# Patient Record
Sex: Female | Born: 1935
Health system: Southern US, Community
[De-identification: ages and names within clinical notes are randomized; demographics above are authoritative.]

## PROBLEM LIST (undated history)

## (undated) DIAGNOSIS — I10 Essential (primary) hypertension: Secondary | ICD-10-CM

## (undated) DIAGNOSIS — R32 Unspecified urinary incontinence: Secondary | ICD-10-CM

## (undated) DIAGNOSIS — Z87442 Personal history of urinary calculi: Secondary | ICD-10-CM

## (undated) DIAGNOSIS — M51369 Other intervertebral disc degeneration, lumbar region without mention of lumbar back pain or lower extremity pain: Secondary | ICD-10-CM

## (undated) DIAGNOSIS — N390 Urinary tract infection, site not specified: Secondary | ICD-10-CM

## (undated) DIAGNOSIS — E119 Type 2 diabetes mellitus without complications: Secondary | ICD-10-CM

## (undated) DIAGNOSIS — K219 Gastro-esophageal reflux disease without esophagitis: Secondary | ICD-10-CM

## (undated) DIAGNOSIS — E785 Hyperlipidemia, unspecified: Secondary | ICD-10-CM

## (undated) DIAGNOSIS — M5136 Other intervertebral disc degeneration, lumbar region: Secondary | ICD-10-CM

## (undated) DIAGNOSIS — M810 Age-related osteoporosis without current pathological fracture: Secondary | ICD-10-CM

## (undated) DIAGNOSIS — T7840XA Allergy, unspecified, initial encounter: Secondary | ICD-10-CM

## (undated) DIAGNOSIS — N3090 Cystitis, unspecified without hematuria: Secondary | ICD-10-CM

## (undated) HISTORY — DX: Age-related osteoporosis without current pathological fracture: M81.0

## (undated) HISTORY — DX: Unspecified urinary incontinence: R32

## (undated) HISTORY — DX: Allergy, unspecified, initial encounter: T78.40XA

## (undated) HISTORY — DX: Personal history of urinary calculi: Z87.442

## (undated) HISTORY — PX: HEMORRHOID SURGERY: SHX153

## (undated) HISTORY — PX: ABDOMINAL HYSTERECTOMY: SHX81

## (undated) HISTORY — DX: Hyperlipidemia, unspecified: E78.5

## (undated) HISTORY — DX: Essential (primary) hypertension: I10

## (undated) HISTORY — DX: Other intervertebral disc degeneration, lumbar region: M51.36

## (undated) HISTORY — DX: Gastro-esophageal reflux disease without esophagitis: K21.9

## (undated) HISTORY — DX: Other intervertebral disc degeneration, lumbar region without mention of lumbar back pain or lower extremity pain: M51.369

## (undated) HISTORY — PX: CATARACT EXTRACTION, BILATERAL: SHX1313

## (undated) HISTORY — DX: Type 2 diabetes mellitus without complications: E11.9

---

## 2004-11-07 ENCOUNTER — Ambulatory Visit: Payer: Self-pay

## 2006-09-04 ENCOUNTER — Ambulatory Visit: Payer: Self-pay | Admitting: Ophthalmology

## 2006-09-10 ENCOUNTER — Ambulatory Visit: Payer: Self-pay | Admitting: Ophthalmology

## 2006-12-11 ENCOUNTER — Ambulatory Visit: Payer: Self-pay | Admitting: Gastroenterology

## 2007-05-12 ENCOUNTER — Emergency Department: Payer: Self-pay | Admitting: Emergency Medicine

## 2007-05-29 ENCOUNTER — Ambulatory Visit: Payer: Self-pay | Admitting: Urology

## 2007-06-03 ENCOUNTER — Ambulatory Visit: Payer: Self-pay | Admitting: Urology

## 2007-10-16 DIAGNOSIS — E119 Type 2 diabetes mellitus without complications: Secondary | ICD-10-CM | POA: Insufficient documentation

## 2007-10-16 DIAGNOSIS — E7800 Pure hypercholesterolemia, unspecified: Secondary | ICD-10-CM | POA: Insufficient documentation

## 2007-10-18 DIAGNOSIS — N200 Calculus of kidney: Secondary | ICD-10-CM | POA: Insufficient documentation

## 2007-10-18 DIAGNOSIS — I10 Essential (primary) hypertension: Secondary | ICD-10-CM | POA: Insufficient documentation

## 2007-10-18 DIAGNOSIS — M810 Age-related osteoporosis without current pathological fracture: Secondary | ICD-10-CM | POA: Insufficient documentation

## 2007-10-22 ENCOUNTER — Ambulatory Visit: Payer: Self-pay | Admitting: Family Medicine

## 2007-10-28 DIAGNOSIS — C44309 Unspecified malignant neoplasm of skin of other parts of face: Secondary | ICD-10-CM | POA: Insufficient documentation

## 2007-10-28 DIAGNOSIS — K5730 Diverticulosis of large intestine without perforation or abscess without bleeding: Secondary | ICD-10-CM | POA: Insufficient documentation

## 2007-10-28 DIAGNOSIS — Z86010 Personal history of colonic polyps: Secondary | ICD-10-CM | POA: Insufficient documentation

## 2008-01-12 ENCOUNTER — Ambulatory Visit: Payer: Self-pay | Admitting: Urology

## 2008-01-14 ENCOUNTER — Ambulatory Visit: Payer: Self-pay | Admitting: Urology

## 2008-03-22 DIAGNOSIS — I471 Supraventricular tachycardia: Secondary | ICD-10-CM | POA: Insufficient documentation

## 2008-03-22 DIAGNOSIS — I4891 Unspecified atrial fibrillation: Secondary | ICD-10-CM | POA: Insufficient documentation

## 2008-07-07 ENCOUNTER — Ambulatory Visit: Payer: Self-pay | Admitting: Family Medicine

## 2008-11-04 ENCOUNTER — Ambulatory Visit: Payer: Self-pay | Admitting: Family Medicine

## 2009-03-03 ENCOUNTER — Ambulatory Visit: Payer: Self-pay | Admitting: Family Medicine

## 2009-04-11 IMAGING — CT CT STONE STUDY
1 of 2 series · 15 of 32 positions shown, 19 images · non-contrast
Comparison: none

REASON FOR EXAM: pain, rm 5
COMMENTS:

[Series 2: stone · axial · 0.73mm/px · z∈[-10,+365]mm · 15 of 141 slices shown, 19 images]
[im 11/141  soft-tissue]
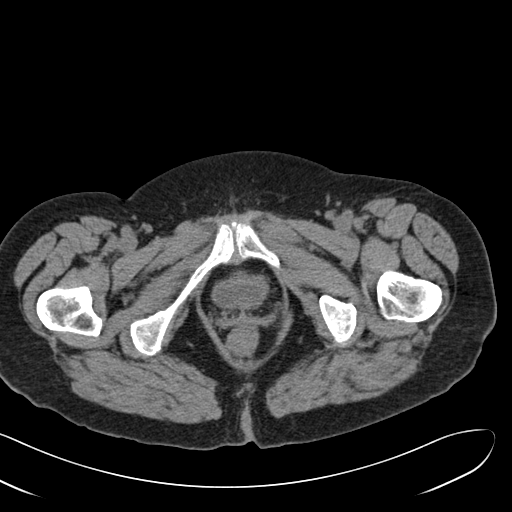
[im 11/141  bone]
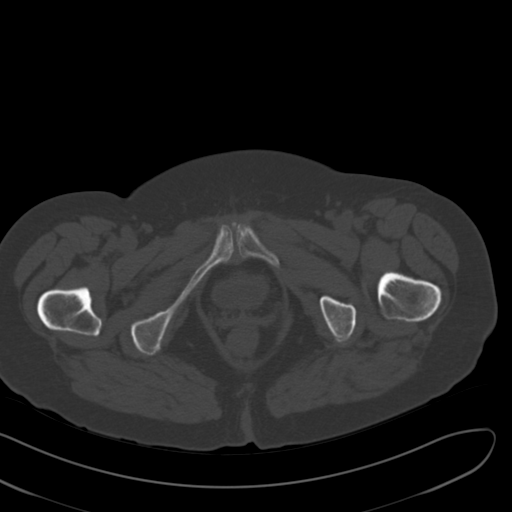
[im 21/141  soft-tissue]
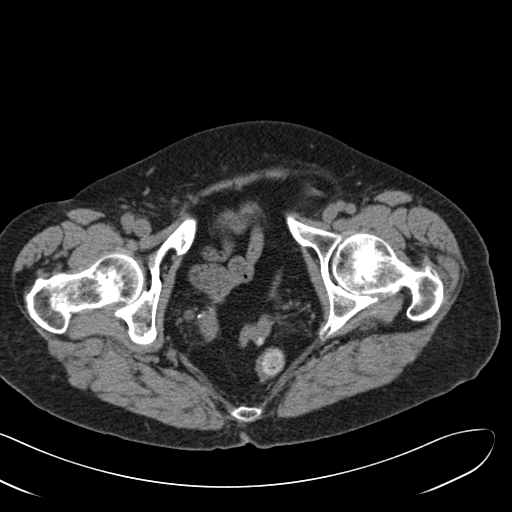
[im 31/141  soft-tissue]
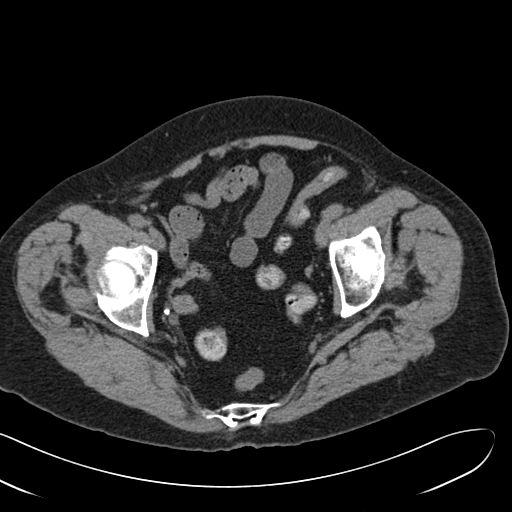
[im 41/141  soft-tissue]
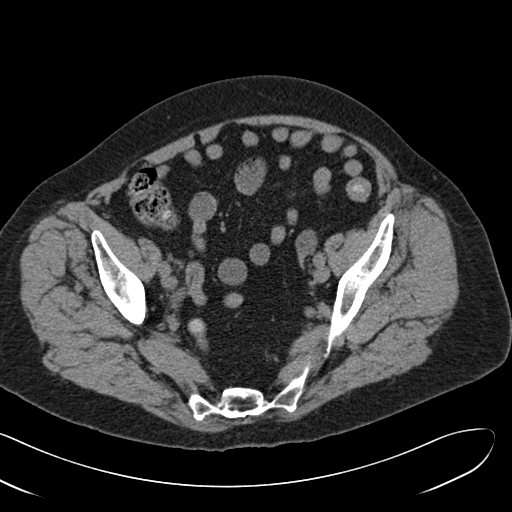
[im 51/141  soft-tissue]
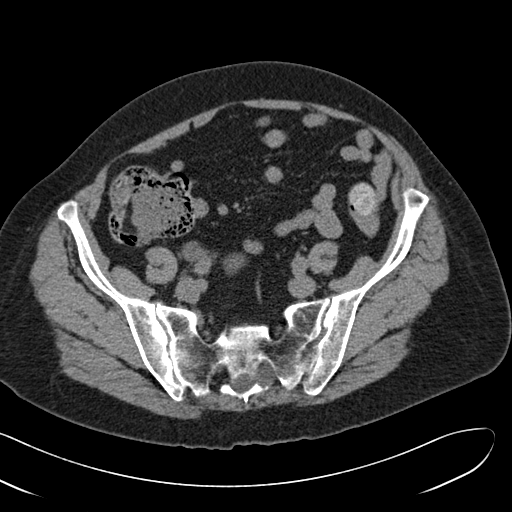
[im 61/141  soft-tissue]
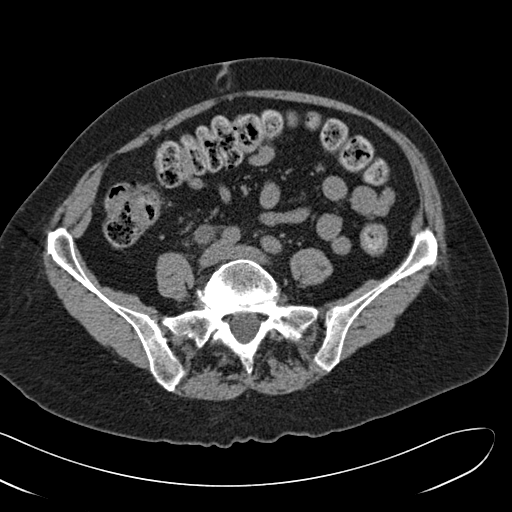
[im 71/141  soft-tissue]
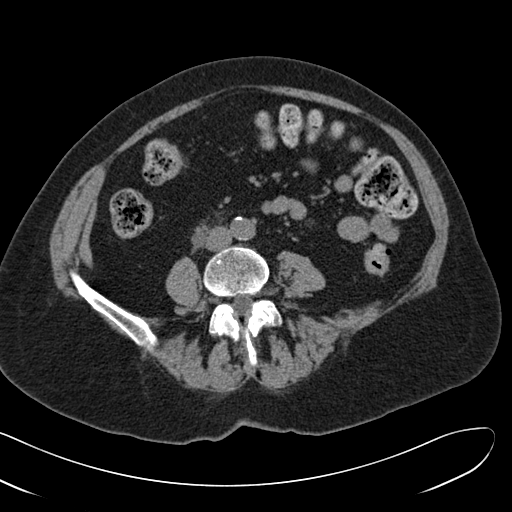
[im 81/141  soft-tissue]
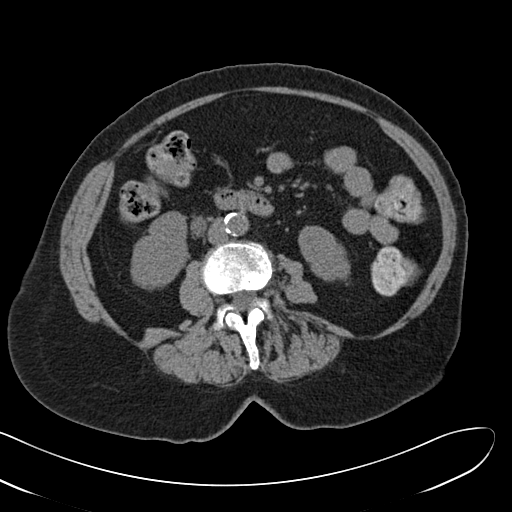
[im 91/141  soft-tissue]
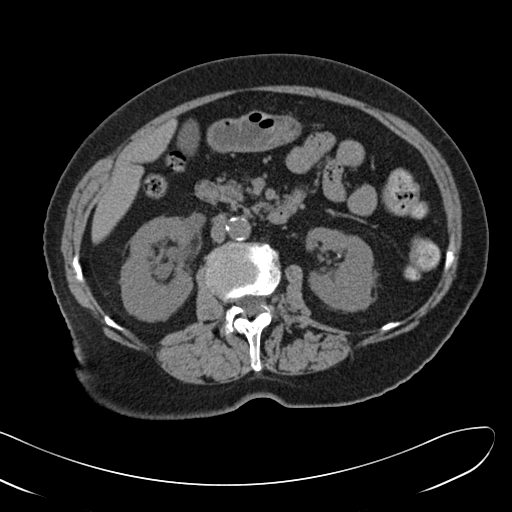
[im 91/141  bone]
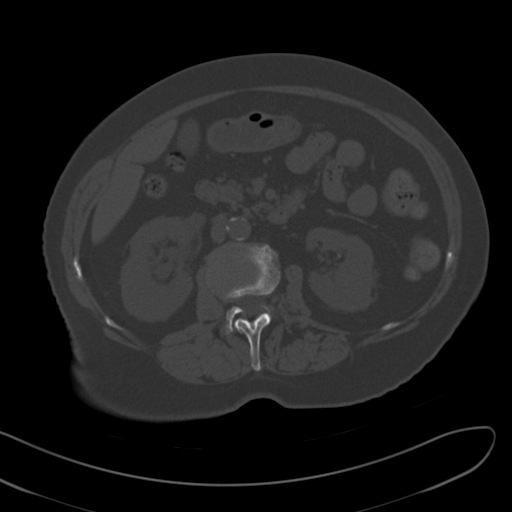
[im 101/141  soft-tissue]
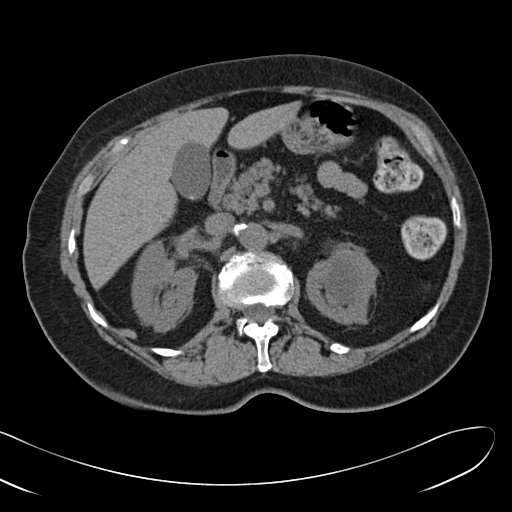
[im 111/141  soft-tissue]
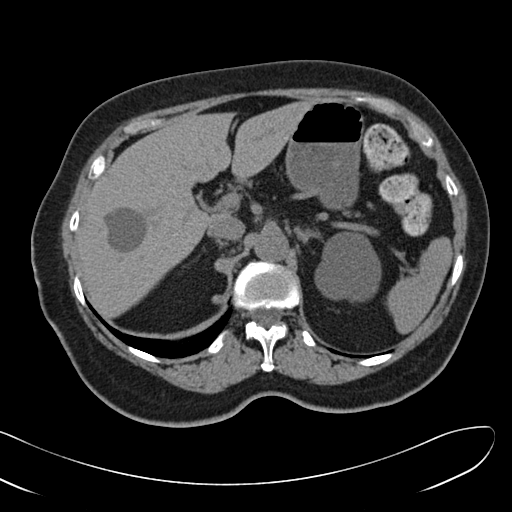
[im 121/141  soft-tissue]
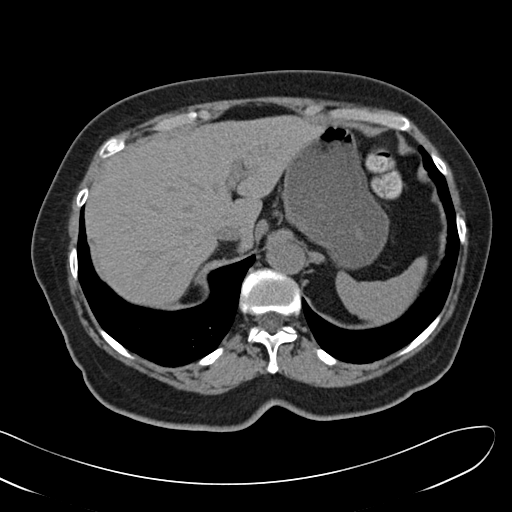
[im 121/141  lung]
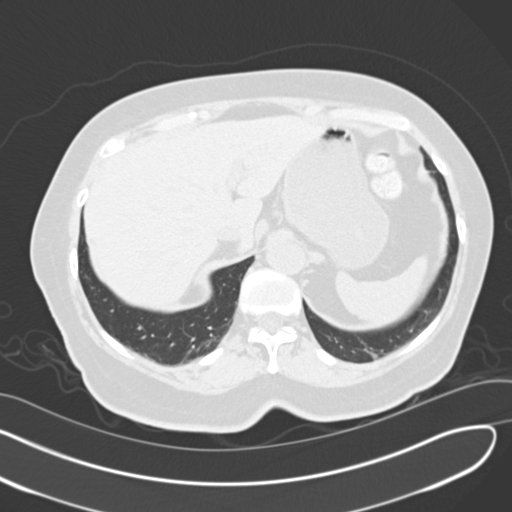
[im 126/141  lung]
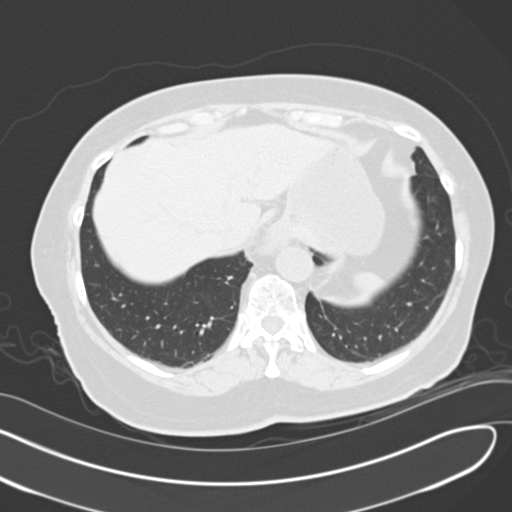
[im 131/141  soft-tissue]
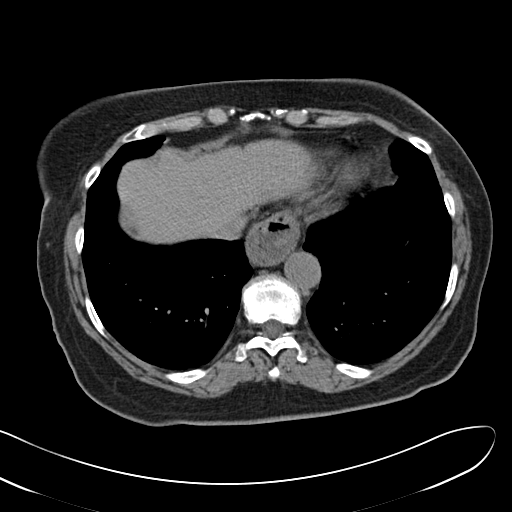
[im 131/141  lung]
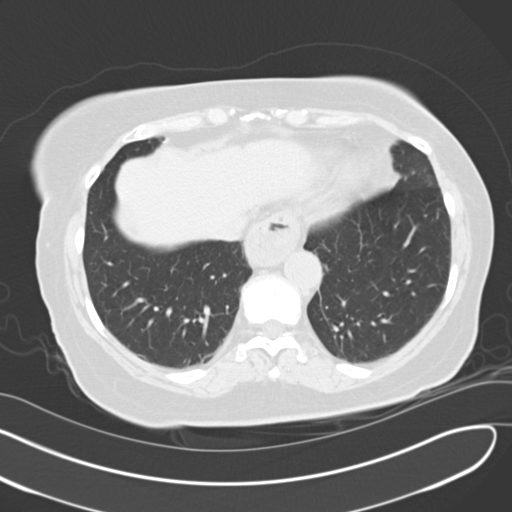
[im 136/141  lung]
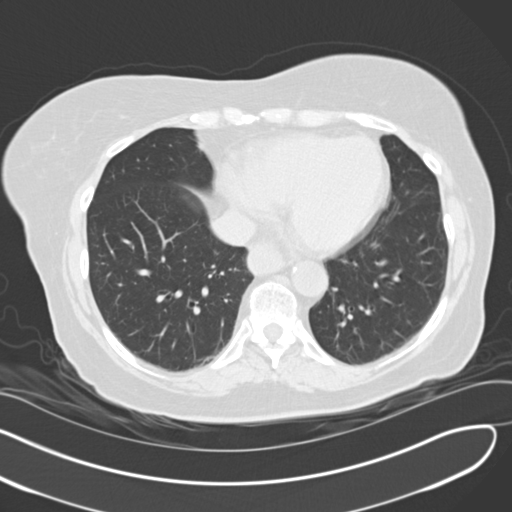

[15 of 32 positions shown; findings below may reference images not displayed]

PROCEDURE:     CT  - CT ABDOMEN /PELVIS WO (STONE)  - May 13, 2007  [DATE]

RESULT:     Emergent renal stone protocol CT of the abdomen and pelvis is
performed without contrast and reconstructed at 3 mm slice thickness.

There are no prior examinations for comparison.

The lung bases appear to be clear. There is no pleural or pericardial
effusion. A small hiatal hernia is present. There is low attenuation within
the liver suggestive of a cyst. There appear to be low attenuation areas in
both kidneys suggestive of cysts most prominently seen in the upper pole of
the LEFT kidney. There is RIGHT renal hydronephrosis and hydroureter
extending inferiorly to a RIGHT ureterovesical junction roughly 2 mm stone.
Additional non-obstructing stones are seen in the RIGHT kidney. There is no
abnormal bowel distention. Gallstones are present. The aorta shows
atherosclerotic calcification with no evidence of aneurysm. Scattered
colonic diverticulosis is evident without evidence of diverticulitis.
IMPRESSION: 1. RIGHT hydronephrosis that is mild to moderate secondary to a 2 mm RIGHT
UVJ stone. Additional non-obstructing RIGHT renal calculi are present.
2. Cholelithiasis.
3. Probable hepatic and renal cysts.

## 2009-04-18 ENCOUNTER — Ambulatory Visit: Payer: Self-pay | Admitting: Gastroenterology

## 2009-04-26 DIAGNOSIS — K648 Other hemorrhoids: Secondary | ICD-10-CM | POA: Insufficient documentation

## 2009-05-18 ENCOUNTER — Ambulatory Visit: Payer: Self-pay | Admitting: Ophthalmology

## 2009-05-18 ENCOUNTER — Ambulatory Visit: Payer: Self-pay | Admitting: Internal Medicine

## 2009-05-31 ENCOUNTER — Ambulatory Visit: Payer: Self-pay | Admitting: Ophthalmology

## 2010-01-12 ENCOUNTER — Ambulatory Visit: Payer: Self-pay | Admitting: Family Medicine

## 2010-05-17 ENCOUNTER — Ambulatory Visit: Payer: Self-pay | Admitting: Family Medicine

## 2010-10-30 ENCOUNTER — Ambulatory Visit: Payer: Self-pay

## 2010-11-14 ENCOUNTER — Ambulatory Visit: Payer: Self-pay

## 2011-03-16 ENCOUNTER — Ambulatory Visit: Payer: Self-pay | Admitting: Family Medicine

## 2011-04-24 ENCOUNTER — Ambulatory Visit: Payer: Self-pay | Admitting: Family Medicine

## 2011-09-10 DIAGNOSIS — I10 Essential (primary) hypertension: Secondary | ICD-10-CM | POA: Diagnosis not present

## 2011-09-10 DIAGNOSIS — R110 Nausea: Secondary | ICD-10-CM | POA: Diagnosis not present

## 2011-09-10 DIAGNOSIS — E7800 Pure hypercholesterolemia, unspecified: Secondary | ICD-10-CM | POA: Diagnosis not present

## 2011-09-10 DIAGNOSIS — E119 Type 2 diabetes mellitus without complications: Secondary | ICD-10-CM | POA: Diagnosis not present

## 2011-09-18 DIAGNOSIS — R197 Diarrhea, unspecified: Secondary | ICD-10-CM | POA: Diagnosis not present

## 2011-09-20 DIAGNOSIS — K5730 Diverticulosis of large intestine without perforation or abscess without bleeding: Secondary | ICD-10-CM | POA: Diagnosis not present

## 2011-09-20 DIAGNOSIS — K6289 Other specified diseases of anus and rectum: Secondary | ICD-10-CM | POA: Diagnosis not present

## 2011-10-08 DIAGNOSIS — C44319 Basal cell carcinoma of skin of other parts of face: Secondary | ICD-10-CM | POA: Diagnosis not present

## 2011-10-08 DIAGNOSIS — L905 Scar conditions and fibrosis of skin: Secondary | ICD-10-CM | POA: Diagnosis not present

## 2011-12-10 DIAGNOSIS — E119 Type 2 diabetes mellitus without complications: Secondary | ICD-10-CM | POA: Diagnosis not present

## 2011-12-10 DIAGNOSIS — M545 Low back pain: Secondary | ICD-10-CM | POA: Diagnosis not present

## 2011-12-10 DIAGNOSIS — I10 Essential (primary) hypertension: Secondary | ICD-10-CM | POA: Diagnosis not present

## 2011-12-10 DIAGNOSIS — E7800 Pure hypercholesterolemia, unspecified: Secondary | ICD-10-CM | POA: Diagnosis not present

## 2012-01-10 DIAGNOSIS — H1010 Acute atopic conjunctivitis, unspecified eye: Secondary | ICD-10-CM | POA: Diagnosis not present

## 2012-01-10 DIAGNOSIS — J309 Allergic rhinitis, unspecified: Secondary | ICD-10-CM | POA: Diagnosis not present

## 2012-01-10 DIAGNOSIS — R05 Cough: Secondary | ICD-10-CM | POA: Diagnosis not present

## 2012-03-27 DIAGNOSIS — Z0000 Encounter for general adult medical examination without abnormal findings: Secondary | ICD-10-CM | POA: Diagnosis not present

## 2012-03-27 DIAGNOSIS — I10 Essential (primary) hypertension: Secondary | ICD-10-CM | POA: Diagnosis not present

## 2012-03-27 DIAGNOSIS — E119 Type 2 diabetes mellitus without complications: Secondary | ICD-10-CM | POA: Diagnosis not present

## 2012-03-27 DIAGNOSIS — Z23 Encounter for immunization: Secondary | ICD-10-CM | POA: Diagnosis not present

## 2012-03-27 DIAGNOSIS — E7800 Pure hypercholesterolemia, unspecified: Secondary | ICD-10-CM | POA: Diagnosis not present

## 2012-05-26 DIAGNOSIS — Z23 Encounter for immunization: Secondary | ICD-10-CM | POA: Diagnosis not present

## 2012-10-06 DIAGNOSIS — E119 Type 2 diabetes mellitus without complications: Secondary | ICD-10-CM | POA: Diagnosis not present

## 2012-10-06 DIAGNOSIS — I10 Essential (primary) hypertension: Secondary | ICD-10-CM | POA: Diagnosis not present

## 2012-10-06 DIAGNOSIS — M549 Dorsalgia, unspecified: Secondary | ICD-10-CM | POA: Diagnosis not present

## 2012-10-06 DIAGNOSIS — E7800 Pure hypercholesterolemia, unspecified: Secondary | ICD-10-CM | POA: Diagnosis not present

## 2012-12-25 DIAGNOSIS — M549 Dorsalgia, unspecified: Secondary | ICD-10-CM | POA: Diagnosis not present

## 2012-12-25 DIAGNOSIS — W57XXXA Bitten or stung by nonvenomous insect and other nonvenomous arthropods, initial encounter: Secondary | ICD-10-CM | POA: Diagnosis not present

## 2012-12-25 DIAGNOSIS — E7800 Pure hypercholesterolemia, unspecified: Secondary | ICD-10-CM | POA: Diagnosis not present

## 2012-12-25 DIAGNOSIS — T148 Other injury of unspecified body region: Secondary | ICD-10-CM | POA: Diagnosis not present

## 2012-12-25 DIAGNOSIS — I10 Essential (primary) hypertension: Secondary | ICD-10-CM | POA: Diagnosis not present

## 2013-01-20 ENCOUNTER — Other Ambulatory Visit: Payer: Self-pay | Admitting: Family Medicine

## 2013-04-13 DIAGNOSIS — E7800 Pure hypercholesterolemia, unspecified: Secondary | ICD-10-CM | POA: Diagnosis not present

## 2013-04-13 DIAGNOSIS — I10 Essential (primary) hypertension: Secondary | ICD-10-CM | POA: Diagnosis not present

## 2013-04-13 DIAGNOSIS — E785 Hyperlipidemia, unspecified: Secondary | ICD-10-CM | POA: Diagnosis not present

## 2013-04-13 DIAGNOSIS — R5381 Other malaise: Secondary | ICD-10-CM | POA: Diagnosis not present

## 2013-04-13 DIAGNOSIS — E119 Type 2 diabetes mellitus without complications: Secondary | ICD-10-CM | POA: Diagnosis not present

## 2013-04-28 ENCOUNTER — Other Ambulatory Visit: Payer: Self-pay | Admitting: Family Medicine

## 2013-06-04 DIAGNOSIS — E785 Hyperlipidemia, unspecified: Secondary | ICD-10-CM | POA: Diagnosis not present

## 2013-06-04 DIAGNOSIS — R5381 Other malaise: Secondary | ICD-10-CM | POA: Diagnosis not present

## 2013-06-04 DIAGNOSIS — E119 Type 2 diabetes mellitus without complications: Secondary | ICD-10-CM | POA: Diagnosis not present

## 2013-06-04 DIAGNOSIS — Z23 Encounter for immunization: Secondary | ICD-10-CM | POA: Diagnosis not present

## 2013-06-04 DIAGNOSIS — I10 Essential (primary) hypertension: Secondary | ICD-10-CM | POA: Diagnosis not present

## 2013-09-14 DIAGNOSIS — R5383 Other fatigue: Secondary | ICD-10-CM | POA: Diagnosis not present

## 2013-09-14 DIAGNOSIS — Z23 Encounter for immunization: Secondary | ICD-10-CM | POA: Diagnosis not present

## 2013-09-14 DIAGNOSIS — E119 Type 2 diabetes mellitus without complications: Secondary | ICD-10-CM | POA: Diagnosis not present

## 2013-09-14 DIAGNOSIS — R5381 Other malaise: Secondary | ICD-10-CM | POA: Diagnosis not present

## 2013-09-14 DIAGNOSIS — F411 Generalized anxiety disorder: Secondary | ICD-10-CM | POA: Diagnosis not present

## 2013-09-14 DIAGNOSIS — I10 Essential (primary) hypertension: Secondary | ICD-10-CM | POA: Diagnosis not present

## 2013-11-23 DIAGNOSIS — R0902 Hypoxemia: Secondary | ICD-10-CM | POA: Diagnosis not present

## 2013-11-23 DIAGNOSIS — J111 Influenza due to unidentified influenza virus with other respiratory manifestations: Secondary | ICD-10-CM | POA: Diagnosis not present

## 2013-11-23 DIAGNOSIS — R509 Fever, unspecified: Secondary | ICD-10-CM | POA: Diagnosis not present

## 2014-01-13 DIAGNOSIS — E119 Type 2 diabetes mellitus without complications: Secondary | ICD-10-CM | POA: Diagnosis not present

## 2014-02-15 ENCOUNTER — Ambulatory Visit: Payer: Self-pay | Admitting: Family Medicine

## 2014-02-15 DIAGNOSIS — R5383 Other fatigue: Secondary | ICD-10-CM | POA: Diagnosis not present

## 2014-02-15 DIAGNOSIS — Z23 Encounter for immunization: Secondary | ICD-10-CM | POA: Diagnosis not present

## 2014-02-15 DIAGNOSIS — E7800 Pure hypercholesterolemia, unspecified: Secondary | ICD-10-CM | POA: Diagnosis not present

## 2014-02-15 DIAGNOSIS — M47817 Spondylosis without myelopathy or radiculopathy, lumbosacral region: Secondary | ICD-10-CM | POA: Diagnosis not present

## 2014-02-15 DIAGNOSIS — M810 Age-related osteoporosis without current pathological fracture: Secondary | ICD-10-CM | POA: Diagnosis not present

## 2014-02-15 DIAGNOSIS — I10 Essential (primary) hypertension: Secondary | ICD-10-CM | POA: Diagnosis not present

## 2014-02-15 DIAGNOSIS — M5137 Other intervertebral disc degeneration, lumbosacral region: Secondary | ICD-10-CM | POA: Diagnosis not present

## 2014-02-15 DIAGNOSIS — E119 Type 2 diabetes mellitus without complications: Secondary | ICD-10-CM | POA: Diagnosis not present

## 2014-02-15 DIAGNOSIS — R5381 Other malaise: Secondary | ICD-10-CM | POA: Diagnosis not present

## 2014-02-15 DIAGNOSIS — M549 Dorsalgia, unspecified: Secondary | ICD-10-CM | POA: Diagnosis not present

## 2014-08-09 DIAGNOSIS — M810 Age-related osteoporosis without current pathological fracture: Secondary | ICD-10-CM | POA: Diagnosis not present

## 2014-08-09 DIAGNOSIS — M545 Low back pain: Secondary | ICD-10-CM | POA: Diagnosis not present

## 2014-08-09 DIAGNOSIS — R42 Dizziness and giddiness: Secondary | ICD-10-CM | POA: Diagnosis not present

## 2014-08-09 DIAGNOSIS — E119 Type 2 diabetes mellitus without complications: Secondary | ICD-10-CM | POA: Diagnosis not present

## 2014-08-09 DIAGNOSIS — M5136 Other intervertebral disc degeneration, lumbar region: Secondary | ICD-10-CM | POA: Diagnosis not present

## 2014-09-28 DIAGNOSIS — M1990 Unspecified osteoarthritis, unspecified site: Secondary | ICD-10-CM | POA: Diagnosis not present

## 2014-09-28 DIAGNOSIS — M810 Age-related osteoporosis without current pathological fracture: Secondary | ICD-10-CM | POA: Diagnosis not present

## 2014-09-28 DIAGNOSIS — M545 Low back pain: Secondary | ICD-10-CM | POA: Diagnosis not present

## 2014-09-28 DIAGNOSIS — E119 Type 2 diabetes mellitus without complications: Secondary | ICD-10-CM | POA: Diagnosis not present

## 2014-09-28 DIAGNOSIS — M5136 Other intervertebral disc degeneration, lumbar region: Secondary | ICD-10-CM | POA: Diagnosis not present

## 2014-10-07 ENCOUNTER — Ambulatory Visit: Payer: Self-pay | Admitting: Family Medicine

## 2014-10-07 DIAGNOSIS — M5432 Sciatica, left side: Secondary | ICD-10-CM | POA: Diagnosis not present

## 2014-10-07 DIAGNOSIS — M47896 Other spondylosis, lumbar region: Secondary | ICD-10-CM | POA: Diagnosis not present

## 2014-10-07 DIAGNOSIS — M419 Scoliosis, unspecified: Secondary | ICD-10-CM | POA: Diagnosis not present

## 2014-10-07 DIAGNOSIS — M4806 Spinal stenosis, lumbar region: Secondary | ICD-10-CM | POA: Diagnosis not present

## 2014-10-14 DIAGNOSIS — M545 Low back pain: Secondary | ICD-10-CM | POA: Diagnosis not present

## 2014-10-14 DIAGNOSIS — M5432 Sciatica, left side: Secondary | ICD-10-CM | POA: Diagnosis not present

## 2014-10-14 DIAGNOSIS — M5136 Other intervertebral disc degeneration, lumbar region: Secondary | ICD-10-CM | POA: Diagnosis not present

## 2014-10-14 DIAGNOSIS — R079 Chest pain, unspecified: Secondary | ICD-10-CM | POA: Diagnosis not present

## 2014-10-14 DIAGNOSIS — E119 Type 2 diabetes mellitus without complications: Secondary | ICD-10-CM | POA: Diagnosis not present

## 2014-10-26 DIAGNOSIS — M5126 Other intervertebral disc displacement, lumbar region: Secondary | ICD-10-CM | POA: Diagnosis not present

## 2014-10-26 DIAGNOSIS — I10 Essential (primary) hypertension: Secondary | ICD-10-CM | POA: Diagnosis not present

## 2014-10-26 DIAGNOSIS — M4126 Other idiopathic scoliosis, lumbar region: Secondary | ICD-10-CM | POA: Diagnosis not present

## 2014-10-26 DIAGNOSIS — M4806 Spinal stenosis, lumbar region: Secondary | ICD-10-CM | POA: Diagnosis not present

## 2014-11-08 ENCOUNTER — Ambulatory Visit: Payer: Self-pay | Admitting: Family Medicine

## 2014-11-08 DIAGNOSIS — M8589 Other specified disorders of bone density and structure, multiple sites: Secondary | ICD-10-CM | POA: Diagnosis not present

## 2014-11-08 DIAGNOSIS — M8580 Other specified disorders of bone density and structure, unspecified site: Secondary | ICD-10-CM | POA: Diagnosis not present

## 2014-11-10 DIAGNOSIS — M5416 Radiculopathy, lumbar region: Secondary | ICD-10-CM | POA: Diagnosis not present

## 2014-11-10 DIAGNOSIS — M5126 Other intervertebral disc displacement, lumbar region: Secondary | ICD-10-CM | POA: Diagnosis not present

## 2014-12-01 DIAGNOSIS — M51369 Other intervertebral disc degeneration, lumbar region without mention of lumbar back pain or lower extremity pain: Secondary | ICD-10-CM | POA: Insufficient documentation

## 2014-12-01 DIAGNOSIS — M5416 Radiculopathy, lumbar region: Secondary | ICD-10-CM | POA: Diagnosis not present

## 2014-12-01 DIAGNOSIS — M5126 Other intervertebral disc displacement, lumbar region: Secondary | ICD-10-CM | POA: Diagnosis not present

## 2014-12-01 DIAGNOSIS — M5136 Other intervertebral disc degeneration, lumbar region: Secondary | ICD-10-CM | POA: Insufficient documentation

## 2014-12-16 DIAGNOSIS — R300 Dysuria: Secondary | ICD-10-CM | POA: Diagnosis not present

## 2014-12-16 DIAGNOSIS — R079 Chest pain, unspecified: Secondary | ICD-10-CM | POA: Diagnosis not present

## 2014-12-16 DIAGNOSIS — M545 Low back pain: Secondary | ICD-10-CM | POA: Diagnosis not present

## 2014-12-16 DIAGNOSIS — M5136 Other intervertebral disc degeneration, lumbar region: Secondary | ICD-10-CM | POA: Diagnosis not present

## 2014-12-16 DIAGNOSIS — E119 Type 2 diabetes mellitus without complications: Secondary | ICD-10-CM | POA: Diagnosis not present

## 2014-12-28 DIAGNOSIS — M5416 Radiculopathy, lumbar region: Secondary | ICD-10-CM | POA: Diagnosis not present

## 2014-12-28 DIAGNOSIS — M5136 Other intervertebral disc degeneration, lumbar region: Secondary | ICD-10-CM | POA: Diagnosis not present

## 2014-12-28 DIAGNOSIS — M5126 Other intervertebral disc displacement, lumbar region: Secondary | ICD-10-CM | POA: Diagnosis not present

## 2015-01-04 DIAGNOSIS — Z7982 Long term (current) use of aspirin: Secondary | ICD-10-CM | POA: Diagnosis not present

## 2015-01-04 DIAGNOSIS — M5416 Radiculopathy, lumbar region: Secondary | ICD-10-CM | POA: Diagnosis not present

## 2015-01-07 DIAGNOSIS — M5416 Radiculopathy, lumbar region: Secondary | ICD-10-CM | POA: Diagnosis not present

## 2015-01-07 DIAGNOSIS — M545 Low back pain: Secondary | ICD-10-CM | POA: Diagnosis not present

## 2015-01-07 DIAGNOSIS — M5136 Other intervertebral disc degeneration, lumbar region: Secondary | ICD-10-CM | POA: Diagnosis not present

## 2015-01-07 DIAGNOSIS — Z7982 Long term (current) use of aspirin: Secondary | ICD-10-CM | POA: Diagnosis not present

## 2015-01-07 DIAGNOSIS — R079 Chest pain, unspecified: Secondary | ICD-10-CM | POA: Diagnosis not present

## 2015-01-07 DIAGNOSIS — E119 Type 2 diabetes mellitus without complications: Secondary | ICD-10-CM | POA: Diagnosis not present

## 2015-01-07 DIAGNOSIS — N3000 Acute cystitis without hematuria: Secondary | ICD-10-CM | POA: Diagnosis not present

## 2015-01-11 DIAGNOSIS — M5416 Radiculopathy, lumbar region: Secondary | ICD-10-CM | POA: Diagnosis not present

## 2015-01-11 DIAGNOSIS — Z7982 Long term (current) use of aspirin: Secondary | ICD-10-CM | POA: Diagnosis not present

## 2015-01-13 DIAGNOSIS — M5416 Radiculopathy, lumbar region: Secondary | ICD-10-CM | POA: Diagnosis not present

## 2015-01-13 DIAGNOSIS — Z7982 Long term (current) use of aspirin: Secondary | ICD-10-CM | POA: Diagnosis not present

## 2015-01-18 DIAGNOSIS — M5416 Radiculopathy, lumbar region: Secondary | ICD-10-CM | POA: Diagnosis not present

## 2015-01-18 DIAGNOSIS — Z7982 Long term (current) use of aspirin: Secondary | ICD-10-CM | POA: Diagnosis not present

## 2015-01-19 DIAGNOSIS — I10 Essential (primary) hypertension: Secondary | ICD-10-CM | POA: Diagnosis not present

## 2015-01-19 DIAGNOSIS — E119 Type 2 diabetes mellitus without complications: Secondary | ICD-10-CM | POA: Diagnosis not present

## 2015-01-19 DIAGNOSIS — M545 Low back pain: Secondary | ICD-10-CM | POA: Diagnosis not present

## 2015-01-19 DIAGNOSIS — M549 Dorsalgia, unspecified: Secondary | ICD-10-CM | POA: Diagnosis not present

## 2015-01-19 DIAGNOSIS — E780 Pure hypercholesterolemia: Secondary | ICD-10-CM | POA: Diagnosis not present

## 2015-01-20 DIAGNOSIS — M5416 Radiculopathy, lumbar region: Secondary | ICD-10-CM | POA: Diagnosis not present

## 2015-01-20 DIAGNOSIS — Z7982 Long term (current) use of aspirin: Secondary | ICD-10-CM | POA: Diagnosis not present

## 2015-01-26 DIAGNOSIS — Z7982 Long term (current) use of aspirin: Secondary | ICD-10-CM | POA: Diagnosis not present

## 2015-01-26 DIAGNOSIS — M5416 Radiculopathy, lumbar region: Secondary | ICD-10-CM | POA: Diagnosis not present

## 2015-01-27 DIAGNOSIS — Z7982 Long term (current) use of aspirin: Secondary | ICD-10-CM | POA: Diagnosis not present

## 2015-01-27 DIAGNOSIS — M5416 Radiculopathy, lumbar region: Secondary | ICD-10-CM | POA: Diagnosis not present

## 2015-01-28 ENCOUNTER — Other Ambulatory Visit: Payer: Self-pay | Admitting: Neurosurgery

## 2015-01-28 DIAGNOSIS — Z6825 Body mass index (BMI) 25.0-25.9, adult: Secondary | ICD-10-CM | POA: Diagnosis not present

## 2015-01-28 DIAGNOSIS — M4126 Other idiopathic scoliosis, lumbar region: Secondary | ICD-10-CM | POA: Diagnosis not present

## 2015-01-28 DIAGNOSIS — M5416 Radiculopathy, lumbar region: Secondary | ICD-10-CM | POA: Diagnosis not present

## 2015-01-28 DIAGNOSIS — M545 Low back pain: Secondary | ICD-10-CM | POA: Diagnosis not present

## 2015-01-28 DIAGNOSIS — M5126 Other intervertebral disc displacement, lumbar region: Secondary | ICD-10-CM | POA: Diagnosis not present

## 2015-01-28 DIAGNOSIS — R030 Elevated blood-pressure reading, without diagnosis of hypertension: Secondary | ICD-10-CM | POA: Diagnosis not present

## 2015-01-31 DIAGNOSIS — Z7982 Long term (current) use of aspirin: Secondary | ICD-10-CM | POA: Diagnosis not present

## 2015-01-31 DIAGNOSIS — M5416 Radiculopathy, lumbar region: Secondary | ICD-10-CM | POA: Diagnosis not present

## 2015-02-02 ENCOUNTER — Telehealth: Payer: Self-pay | Admitting: Family Medicine

## 2015-02-02 DIAGNOSIS — M5416 Radiculopathy, lumbar region: Secondary | ICD-10-CM | POA: Diagnosis not present

## 2015-02-02 DIAGNOSIS — Z7982 Long term (current) use of aspirin: Secondary | ICD-10-CM | POA: Diagnosis not present

## 2015-02-02 NOTE — Telephone Encounter (Signed)
Pt daughter, Juanetta Snow called states pt is going to have back surgery 02/07/2015 and suppose to have pre-op tomorrow.  Pt is having burning when she sit down to void.  Pt daughter is asking if this could be the start of another UTI.  She is concerned due to pt getting ready to have surgery.  Pt has has 3 different Rx for this over the last 2 months.  Pt daughter is asking does pt need to wait on surgery until this can be cleared up?  Does pt need to be seen tomorrow?   CB#[phone]

## 2015-02-03 ENCOUNTER — Inpatient Hospital Stay (HOSPITAL_COMMUNITY): Admission: RE | Admit: 2015-02-03 | Payer: Self-pay | Source: Ambulatory Visit

## 2015-02-03 ENCOUNTER — Encounter: Payer: Self-pay | Admitting: Family Medicine

## 2015-02-03 ENCOUNTER — Ambulatory Visit (INDEPENDENT_AMBULATORY_CARE_PROVIDER_SITE_OTHER): Payer: Medicare Other | Admitting: Family Medicine

## 2015-02-03 VITALS — BP 116/78 | HR 68 | Temp 97.9°F | Resp 16 | Ht 62.0 in | Wt 143.8 lb

## 2015-02-03 DIAGNOSIS — R300 Dysuria: Secondary | ICD-10-CM

## 2015-02-03 DIAGNOSIS — Z1389 Encounter for screening for other disorder: Secondary | ICD-10-CM | POA: Diagnosis not present

## 2015-02-03 LAB — POCT URINALYSIS DIPSTICK
Bilirubin, UA: NEGATIVE
Blood, UA: NEGATIVE
Glucose, UA: NEGATIVE
KETONES UA: NEGATIVE
Leukocytes, UA: NEGATIVE
Nitrite, UA: NEGATIVE
PH UA: 6
Protein, UA: NEGATIVE
Spec Grav, UA: 1.01
UROBILINOGEN UA: 1

## 2015-02-03 LAB — POCT UA - MICROSCOPIC ONLY: RBC, URINE, MICROSCOPIC: 0

## 2015-02-03 NOTE — Telephone Encounter (Signed)
See if Lynn Wall can see her today to check pt and urine for possible UTI.

## 2015-02-03 NOTE — Telephone Encounter (Signed)
I think I did not send last--it said see if [name] can see her for possible UTI today thanks

## 2015-02-03 NOTE — Patient Instructions (Addendum)
Discussed use of AZO or similar pending urine culture and urology referral Paper rx for Cipro 250 mg.twice daily if symptoms flare over the weekend prior to obtaining the culture.

## 2015-02-03 NOTE — Progress Notes (Signed)
Subjective:     Patient ID: Lynn Wall, female   DOB: 04-27-1927, 81 y.o.   MRN: [id number]  HPI  Chief Complaint  Patient presents with  . Urinary Tract Infection    patient is present in office today with daughter who states that patient is complaining of dysuria and symptoms of bloating. Patient lov was 01/19/15 and urine culture showed that patient had elevated white cell count, symptoms have not gone away since last office visit.   Treated at that time with Septra. Last urine culture in April with a sensitive  E. Coli. Reports hx of hysterectomy and kidney stones.   Review of Systems  Genitourinary: Positive for urgency. Negative for vaginal bleeding and vaginal discharge.       Objective:   Physical Exam  Constitutional: She appears well-developed and well-nourished. No distress (accompanied by her daughter).  Abdominal: Soft. Bowel sounds are normal. There is no tenderness (fullness sensation on palpation of lower quadrants).  Genitourinary: There is no rash (urethra appears nl) on the right labia. There is rash (atrophic appearing tissue of vulva) on the left labia.       Assessment:     1. Dysuria - POCT UA - Microscopic Only - POCT urinalysis dipstick - Urine culture - Ambulatory referral to Urology    Plan:    Discussed use of AZO or similar pending culture results

## 2015-02-03 NOTE — Telephone Encounter (Signed)
Lynn Wall, daughter-advised and appt made with Bob.=aa

## 2015-02-04 ENCOUNTER — Telehealth: Payer: Self-pay | Admitting: Family Medicine

## 2015-02-04 LAB — URINE CULTURE: ORGANISM ID, BACTERIA: NO GROWTH

## 2015-02-04 NOTE — Telephone Encounter (Signed)
Pt is request results from urine test.  [id number]#[phone]

## 2015-02-07 ENCOUNTER — Encounter (HOSPITAL_COMMUNITY): Admission: RE | Payer: Self-pay | Source: Ambulatory Visit

## 2015-02-07 ENCOUNTER — Ambulatory Visit (HOSPITAL_COMMUNITY): Admission: RE | Admit: 2015-02-07 | Payer: Medicare Other | Source: Ambulatory Visit | Admitting: Neurosurgery

## 2015-02-07 DIAGNOSIS — M5416 Radiculopathy, lumbar region: Secondary | ICD-10-CM | POA: Diagnosis not present

## 2015-02-07 DIAGNOSIS — Z7982 Long term (current) use of aspirin: Secondary | ICD-10-CM | POA: Diagnosis not present

## 2015-02-07 SURGERY — LUMBAR LAMINECTOMY/DECOMPRESSION MICRODISCECTOMY 1 LEVEL
Anesthesia: General | Laterality: Left

## 2015-02-08 ENCOUNTER — Other Ambulatory Visit: Payer: Self-pay | Admitting: Neurosurgery

## 2015-02-09 DIAGNOSIS — M5416 Radiculopathy, lumbar region: Secondary | ICD-10-CM | POA: Diagnosis not present

## 2015-02-09 DIAGNOSIS — Z7982 Long term (current) use of aspirin: Secondary | ICD-10-CM | POA: Diagnosis not present

## 2015-02-10 ENCOUNTER — Encounter: Payer: Self-pay | Admitting: *Deleted

## 2015-02-14 ENCOUNTER — Telehealth: Payer: Self-pay | Admitting: Urology

## 2015-02-14 ENCOUNTER — Ambulatory Visit
Admission: RE | Admit: 2015-02-14 | Discharge: 2015-02-14 | Disposition: A | Discharge status: Home / Self Care | Payer: Medicare Other | Source: Ambulatory Visit | Attending: Urology | Admitting: Urology

## 2015-02-14 ENCOUNTER — Encounter: Payer: Self-pay | Admitting: Urology

## 2015-02-14 ENCOUNTER — Ambulatory Visit (INDEPENDENT_AMBULATORY_CARE_PROVIDER_SITE_OTHER): Payer: Medicare Other | Admitting: Urology

## 2015-02-14 VITALS — BP 157/98 | HR 76 | Ht 62.0 in | Wt 143.5 lb

## 2015-02-14 DIAGNOSIS — N3001 Acute cystitis with hematuria: Secondary | ICD-10-CM

## 2015-02-14 DIAGNOSIS — N952 Postmenopausal atrophic vaginitis: Secondary | ICD-10-CM

## 2015-02-14 DIAGNOSIS — N390 Urinary tract infection, site not specified: Secondary | ICD-10-CM | POA: Diagnosis not present

## 2015-02-14 DIAGNOSIS — K8080 Other cholelithiasis without obstruction: Secondary | ICD-10-CM | POA: Insufficient documentation

## 2015-02-14 DIAGNOSIS — N8110 Cystocele, unspecified: Secondary | ICD-10-CM

## 2015-02-14 DIAGNOSIS — Z87442 Personal history of urinary calculi: Secondary | ICD-10-CM | POA: Diagnosis not present

## 2015-02-14 DIAGNOSIS — IMO0002 Reserved for concepts with insufficient information to code with codable children: Secondary | ICD-10-CM

## 2015-02-14 DIAGNOSIS — K8020 Calculus of gallbladder without cholecystitis without obstruction: Secondary | ICD-10-CM | POA: Diagnosis not present

## 2015-02-14 DIAGNOSIS — IMO0001 Reserved for inherently not codable concepts without codable children: Secondary | ICD-10-CM

## 2015-02-14 LAB — URINALYSIS, COMPLETE
Bilirubin, UA: NEGATIVE
Glucose, UA: NEGATIVE
Ketones, UA: NEGATIVE
Leukocytes, UA: NEGATIVE
NITRITE UA: NEGATIVE
PH UA: 7 (ref 5.0–7.5)
Protein, UA: NEGATIVE
RBC, UA: NEGATIVE
SPEC GRAV UA: 1.015 (ref 1.005–1.030)
UUROB: 0.2 mg/dL (ref 0.2–1.0)

## 2015-02-14 LAB — MICROSCOPIC EXAMINATION: BACTERIA UA: NONE SEEN

## 2015-02-14 MED ORDER — ESTRADIOL 0.1 MG/GM VA CREA: 1.0000 | TOPICAL_CREAM | Freq: Every day | VAGINAL | Status: DC

## 2015-02-14 NOTE — Progress Notes (Signed)
02/14/2015 12:33 PM   TOREE EDLING 08-08-1927 [id number]  Referring provider: Carmon Ginsberg, [address] [place], [place] [address]  Chief Complaint  Patient presents with  . Cystitis    HPI: Mrs. Dehne is a 81 year old white female who is referred to [place] for recurrent urinary tract infections by Carmon Ginsberg, PA.  She presents today with her daughter. According to her daughter, the UTI's began when Mrs. Guyett started oral prednisone and then advanced to steroid shots into her spine 6 months ago.  Prior to this, she had not had an UTI in over 20 years.  I do not have her prior UA's or +UCx's at this visit.    Her infections are heralded by constant vaginal burning. She does not have fevers, chills, nausea, vomiting or gross hematuria associated with these infections.  She does have a prior history of kidney stone disease, as recently as 2 years ago. She was managed through the ED for the stones.   Her last urine culture at her primary care physician's office on 02/04/2015 was negative. She was still experiencing the vaginal burning at that time. It was recommended by her PCP that she would benefit from estrogen therapy.  She is scheduled for back surgery in 1 month and her daughter would like this issue resolved before the surgery takes place.  So, we are asked to evaluate the patient.   Her baseline urinary symptoms are urgency and urge incontinence. The symptoms have been occurring over the last 2 years. She wears panty liners to help control the leakage and she wears 2 pads daily.   PMH: Past Medical History  Diagnosis Date  . Diabetes mellitus without complication   . Hypertension   . GERD (gastroesophageal reflux disease)   . DDD (degenerative disc disease), lumbar   . Osteoporosis   . Allergy   . Hyperlipidemia   . History of kidney stones   . Urinary incontinence   . Acid reflux   . History of renal stone     Surgical History: Past  Surgical History  Procedure Laterality Date  . Abdominal hysterectomy      Home Medications:    Medication List       This list is accurate as of: 02/14/15 12:33 PM.  Always use your most recent med list.               aspirin EC 81 MG tablet  Take 81 mg by mouth daily.     enalapril 10 MG tablet  Commonly known as:  VASOTEC  Take 10 mg by mouth daily.     gabapentin 100 MG capsule  Commonly known as:  NEURONTIN  Take 100 mg by mouth 2 (two) times daily.     metFORMIN 500 MG tablet  Commonly known as:  GLUCOPHAGE  Take 500 mg by mouth 2 (two) times daily.     metoprolol tartrate 25 MG tablet  Commonly known as:  LOPRESSOR  Take 25 mg by mouth 2 (two) times daily.     naproxen sodium 220 MG tablet  Commonly known as:  ANAPROX  Take 220 mg by mouth 2 (two) times daily as needed (pain).     raloxifene 60 MG tablet  Commonly known as:  EVISTA  Take 60 mg by mouth daily.     simvastatin 40 MG tablet  Commonly known as:  ZOCOR  Take 40 mg by mouth at bedtime.        Allergies:  No Known Allergies  Family History: Family History  Problem Relation Age of Onset  . Cancer Father     Social History:  reports that she has never smoked. She does not have any smokeless tobacco history on file. She reports that she does not drink alcohol or use illicit drugs.  ROS: Urological Symptom Review  Patient is experiencing the following symptoms: Hard to postpone urination Burning/pain with urination Get up at night to urinate Leakage of urine Urinary tract infection   Review of Systems  Gastrointestinal (upper)  : Indigestion/heartburn  Gastrointestinal (lower) : Constipation  Constitutional : Fatigue  Skin: Negative for skin symptoms  Eyes: Negative for eye symptoms  Ear/Nose/Throat : Negative for Ear/Nose/Throat symptoms  Hematologic/Lymphatic: Negative for Hematologic/Lymphatic symptoms  Cardiovascular : Negative for cardiovascular  symptoms  Respiratory : Negative for respiratory symptoms  Endocrine: Negative for endocrine symptoms  Musculoskeletal: Back pain  Neurological: Negative for neurological symptoms  Psychologic: Negative for psychiatric symptoms   Physical Exam: BP 157/98 mmHg  Pulse 76  Ht 5\' 2"  (1.575 m)  Wt 143 lb 8 oz (65.091 kg)  BMI 26.24 kg/m2  LMP  (LMP Unknown)  Constitutional:  Alert and oriented, No acute distress. HEENT: [place] AT, moist mucus membranes.  Trachea midline, no masses. Cardiovascular: No clubbing, cyanosis, or edema. Respiratory: Normal respiratory effort, no increased work of breathing. GI: Abdomen is soft, nontender, nondistended, no abdominal masses GU: No CVA tenderness. Patient with sebaceous cysts in the labia majora.  Atrophic genitalia.  Normal urethral meatus. No urethral masses and/or tenderness. No bladder fullness or masses.Grade II cystocele.   No vaginal lesions or discharge. Normal rectal tone, no masses. Normal anus and perineum.  Skin: No rashes, bruises or suspicious lesions. Lymph: No cervical or inguinal adenopathy. Neurologic: Grossly intact, no focal deficits, moving all 4 extremities. Psychiatric: Normal mood and affect.  Laboratory Data: Results for orders placed or performed in visit on 02/14/15  Microscopic Examination  Result Value Ref Range   WBC, UA 0-5 0 -  5 /hpf   RBC, UA 0-2 0 -  2 /hpf   Epithelial Cells (non renal) 0-10 0 - 10 /hpf   Renal Epithel, UA 0-10 (A) None seen /hpf   Bacteria, UA None seen None seen/Few  Urinalysis, Complete  Result Value Ref Range   Specific Gravity, UA 1.015 1.005 - 1.030   pH, UA 7.0 5.0 - 7.5   Color, UA Yellow Yellow   Appearance Ur Clear Clear   Leukocytes, UA Negative Negative   Protein, UA Negative Negative/Trace   Glucose, UA Negative Negative   Ketones, UA Negative Negative   RBC, UA Negative Negative   Bilirubin, UA Negative Negative   Urobilinogen, Ur 0.2 0.2 - 1.0 mg/dL   Nitrite,  UA Negative Negative   Microscopic Examination See below:    No results found for: WBC, HGB, HCT, MCV, PLT  No results found for: CREATININE  No results found for: PSA  No results found for: TESTOSTERONE  No results found for: HGBA1C  Urinalysis    Component Value Date/Time   BILIRUBINUR negative 02/03/2015 1242   PROTEINUR negative 02/03/2015 1242   UROBILINOGEN 1.0 02/03/2015 1242   NITRITE negative 02/03/2015 1242   LEUKOCYTESUR Negative 02/03/2015 1242    Pertinent Imaging: CLINICAL DATA: Recurrent UTIs.  EXAM: ABDOMEN - 1 VIEW  COMPARISON: None.  FINDINGS: Soft tissue structures are unremarkable. Calcifications in the right upper quadrant consistent with gallstones. Large amount stool in colon. Constipation could present  this fashion. No bowel distention. No free air.  IMPRESSION: 1. Gallstones. 2. Large amount of stool noted colon suggesting constipation .   Electronically Signed  By: Marcello Moores Register  On: 02/14/2015 15:11  Assessment & Plan:    1. Recurrent UTI's:  Patient and her daughter report  a 6 month history of recurrent UTI's.  I do not have those records from her PCP's office.  I will request them.  Her last UCx result was no growth.  She was cathed for her specimen today.  We will send that for culture.    - Urinalysis, Complete - CULTURE, URINE COMPREHENSIVE  2. Atrophic vaginitis:  Patient was found to have atrophic vaginitis.  Patient was given a sample of vaginal estrogen cream (Estrace) and instructed to apply 0.5mg  (pea-sized amount)  just inside the vaginal introitus with a finger-tip every night for two weeks and then Monday, Wednesday and Friday nights.  I explained to the patient that vaginally administered estrogen, which causes only a slight increase in the blood estrogen levels, have fewer contraindications and adverse systemic effects that oral HT.  She will return in 2 weeks for exam and symptom recheck.    3. Cystocele:     Patient was found to have a Grade II cystocele on exam today.  She may be holding urine back because of the cystocele resulting in a reservoir for infection.   I have offered a referral to gynecology for a pessary fitting, but the patient is not interested at this time.    4. History of kidney stones:  We will obtain a KUB to rule out stones as a nidus for her infections.     No Follow-up on file.  Zara Council, [facility] [address], [place], [place] [address] [phone]

## 2015-02-14 NOTE — Telephone Encounter (Signed)
Please call [facility] for her UA's and UCx from the last six months.

## 2015-02-15 ENCOUNTER — Telehealth: Payer: Self-pay

## 2015-02-15 DIAGNOSIS — N952 Postmenopausal atrophic vaginitis: Secondary | ICD-10-CM | POA: Insufficient documentation

## 2015-02-15 DIAGNOSIS — N390 Urinary tract infection, site not specified: Secondary | ICD-10-CM | POA: Insufficient documentation

## 2015-02-15 NOTE — Telephone Encounter (Signed)
-----   Message from Nori Riis, PA-C sent at 02/14/2015 10:49 PM EDT ----- No stones seen on KUB, but patient had a large amount of stool in her colon.  Constipation can contribute to UTI's.  She needs to contact her PCP to see how to manage her constipation.

## 2015-02-15 NOTE — Telephone Encounter (Signed)
Spoke with pt in reference to KUB. Pt voiced understanding of f/u with PCP. Cw,lpn

## 2015-02-15 NOTE — Progress Notes (Signed)
In and Out Catheterization  Patient is present today for a I & O catheterization due to recurrent UTI's. Patient was cleaned and prepped in a sterile fashion with betadine and Lidocaine 2% jelly was instilled into the urethra.  A 14 FR cath was inserted no complications were noted , 56ml of urine return was noted, urine was yellow in color. A clean urine sample was collected for clean catch specimen. Bladder was drained  And catheter was removed with out difficulty.    Preformed by:K. Russell,CMA

## 2015-02-16 LAB — CULTURE, URINE COMPREHENSIVE

## 2015-02-17 ENCOUNTER — Telehealth: Payer: Self-pay

## 2015-02-17 NOTE — Telephone Encounter (Signed)
Pt made aware of negative urine cx. Cw,lpn

## 2015-02-17 NOTE — Telephone Encounter (Signed)
-----   Message from Nori Riis, PA-C sent at 02/16/2015  8:45 PM EDT ----- Patient's urine culture is negative.

## 2015-02-18 DIAGNOSIS — IMO0002 Reserved for concepts with insufficient information to code with codable children: Secondary | ICD-10-CM

## 2015-02-18 DIAGNOSIS — Z87442 Personal history of urinary calculi: Secondary | ICD-10-CM | POA: Insufficient documentation

## 2015-02-18 DIAGNOSIS — IMO0001 Reserved for inherently not codable concepts without codable children: Secondary | ICD-10-CM | POA: Insufficient documentation

## 2015-02-21 ENCOUNTER — Encounter: Payer: Self-pay | Admitting: Family Medicine

## 2015-03-03 ENCOUNTER — Encounter: Payer: Self-pay | Admitting: Urology

## 2015-03-03 ENCOUNTER — Ambulatory Visit: Payer: Medicare Other | Admitting: Urology

## 2015-03-03 ENCOUNTER — Ambulatory Visit (INDEPENDENT_AMBULATORY_CARE_PROVIDER_SITE_OTHER): Payer: Medicare Other | Admitting: Urology

## 2015-03-03 VITALS — BP 174/84 | HR 73 | Ht 62.0 in | Wt 142.6 lb

## 2015-03-03 DIAGNOSIS — N3020 Other chronic cystitis without hematuria: Secondary | ICD-10-CM | POA: Diagnosis not present

## 2015-03-03 DIAGNOSIS — N952 Postmenopausal atrophic vaginitis: Secondary | ICD-10-CM | POA: Diagnosis not present

## 2015-03-03 LAB — MICROSCOPIC EXAMINATION

## 2015-03-03 LAB — URINALYSIS, COMPLETE
Bilirubin, UA: NEGATIVE
GLUCOSE, UA: NEGATIVE
KETONES UA: NEGATIVE
Nitrite, UA: POSITIVE — AB
PROTEIN UA: NEGATIVE
RBC, UA: NEGATIVE
Specific Gravity, UA: 1.01 (ref 1.005–1.030)
Urobilinogen, Ur: 0.2 mg/dL (ref 0.2–1.0)
pH, UA: 6 (ref 5.0–7.5)

## 2015-03-03 MED ORDER — NITROFURANTOIN MONOHYD MACRO 100 MG PO CAPS: 100.0000 mg | ORAL_CAPSULE | Freq: Two times a day (BID) | ORAL | Status: DC

## 2015-03-03 NOTE — Progress Notes (Signed)
03/03/2015 10:58 PM   Lynn Wall [birth year] [id number]  Referring provider: Jerrol Banana., MD [address] [place], [place] [address]  Chief Complaint  Patient presents with  . Cystitis    follow up    HPI: Lynn Wall is an 81 year old white female with a history of recurrent urinary tract infections and atrophic vaginitis presents today for follow-up.  When the patient presented to [place] 2 weeks ago, she was found to have a trophic vaginitis and placed on estrogen cream vaginally. She states she is using the vaginal estrogen cream as prescribed, but she is experiencing burning applies the medication on occasions.  She is very frustrated with her urinary symptoms at this time. She states sometimes she has a burning sensation that is independent of urination, sometimes the burning sensation occurs after urination but she does not experience the burning sensation during urination.  She is not experiencing any gross hematuria, suprapubic pain, fevers, chills, nausea or vomiting.  She does suffer with constipation. Her UA today looks suspicious for urinary tract infections.  He is very concerned that she will have to postpone her back operation once again due to her urinary tract infections.  PMH: Past Medical History  Diagnosis Date  . Diabetes mellitus without complication   . Hypertension   . GERD (gastroesophageal reflux disease)   . DDD (degenerative disc disease), lumbar   . Osteoporosis   . Allergy   . Hyperlipidemia   . History of kidney stones   . Urinary incontinence   . Acid reflux   . History of renal stone     Surgical History: Past Surgical History  Procedure Laterality Date  . Abdominal hysterectomy    . Hemorrhoid surgery      Home Medications:    Medication List       This list is accurate as of: 03/03/15 10:58 PM.  Always use your most recent med list.               aspirin EC 81 MG tablet  Take 81 mg by mouth  daily.     enalapril 10 MG tablet  Commonly known as:  VASOTEC  Take 10 mg by mouth daily.     estradiol 0.1 MG/GM vaginal cream  Commonly known as:  ESTRACE  Place 1 Applicatorful vaginally at bedtime.     gabapentin 100 MG capsule  Commonly known as:  NEURONTIN  Take 100 mg by mouth as needed.     metFORMIN 500 MG tablet  Commonly known as:  GLUCOPHAGE  Take 500 mg by mouth 2 (two) times daily.     metoprolol tartrate 25 MG tablet  Commonly known as:  LOPRESSOR  Take 25 mg by mouth 2 (two) times daily.     naproxen sodium 220 MG tablet  Commonly known as:  ANAPROX  Take 220 mg by mouth 2 (two) times daily as needed (pain).     nitrofurantoin (macrocrystal-monohydrate) 100 MG capsule  Commonly known as:  MACROBID  Take 1 capsule (100 mg total) by mouth every 12 (twelve) hours.     raloxifene 60 MG tablet  Commonly known as:  EVISTA  Take 60 mg by mouth daily.     simvastatin 40 MG tablet  Commonly known as:  ZOCOR  Take 40 mg by mouth at bedtime.        Allergies: No Known Allergies  Family History: Family History  Problem Relation Age of Onset  . Cancer Father   .  Kidney disease Neg Hx   . Prostate cancer Neg Hx   . Bladder Cancer Neg Hx     Social History:  reports that she has never smoked. She does not have any smokeless tobacco history on file. She reports that she does not drink alcohol. Her drug history is not on file.  ROS: Urological Symptom Review  Patient is experiencing the following symptoms: Hard to postpone urination Burning/pain with urination Get up at night to urinate Urinary tract infection   Review of Systems  Gastrointestinal (upper)  : Negative for upper GI symptoms  Gastrointestinal (lower) : Constipation  Constitutional : Negative for symptoms  Skin: Negative for skin symptoms  Eyes: Negative for eye symptoms  Ear/Nose/Throat : Negative for Ear/Nose/Throat symptoms  Hematologic/Lymphatic: Negative for  Hematologic/Lymphatic symptoms  Cardiovascular : Negative for cardiovascular symptoms  Respiratory : Negative for respiratory symptoms  Endocrine: Negative for endocrine symptoms  Musculoskeletal: Back pain  Neurological: Negative for neurological symptoms  Psychologic: Negative for psychiatric symptoms   Physical Exam: BP 174/84 mmHg  Pulse 73  Ht 5\' 2"  (1.575 m)  Wt 142 lb 9.6 oz (64.683 kg)  BMI 26.08 kg/m2  LMP  (LMP Unknown)  GU: Atrophic external genitalia.  Normal urethral meatus. No urethral masses and/or tenderness. No bladder fullness or masses. No vaginal lesions or discharge. Normal rectal tone, no masses. Normal anus and perineum.    Laboratory Data: Results for orders placed or performed in visit on 03/03/15  Microscopic Examination  Result Value Ref Range   WBC, UA 6-10 (A) 0 -  5 /hpf   RBC, UA 0-2 0 -  2 /hpf   Epithelial Cells (non renal) 0-10 0 - 10 /hpf   Bacteria, UA Moderate (A) None seen/Few  Urinalysis, Complete  Result Value Ref Range   Specific Gravity, UA 1.010 1.005 - 1.030   pH, UA 6.0 5.0 - 7.5   Color, UA Yellow Yellow   Appearance Ur Clear Clear   Leukocytes, UA 1+ (A) Negative   Protein, UA Negative Negative/Trace   Glucose, UA Negative Negative   Ketones, UA Negative Negative   RBC, UA Negative Negative   Bilirubin, UA Negative Negative   Urobilinogen, Ur 0.2 0.2 - 1.0 mg/dL   Nitrite, UA Positive (A) Negative   Microscopic Examination See below:    No results found for: WBC, HGB, HCT, MCV, PLT  No results found for: CREATININE  No results found for: PSA  No results found for: TESTOSTERONE  No results found for: HGBA1C  Urinalysis    Component Value Date/Time   GLUCOSEU Negative 03/03/2015 1516   BILIRUBINUR Negative 03/03/2015 1516   BILIRUBINUR negative 02/03/2015 1242   PROTEINUR negative 02/03/2015 1242   UROBILINOGEN 1.0 02/03/2015 1242   NITRITE Positive* 03/03/2015 1516   NITRITE negative 02/03/2015  1242   LEUKOCYTESUR 1+* 03/03/2015 1516   LEUKOCYTESUR Negative 02/03/2015 1242    Pertinent Imaging:   Assessment & Plan:    1. Chronic cystitis:    Patient's UA is suspicious for infection today. I will send for culture. I will. start her on Macrobid 100 mg twice daily empirically. We will adjust the antibiotic as appropriate when sensitivities are available. I would like her to continue an antibiotic until she undergoes her back surgery on 03/14/2015.   She will follow-up the first week of August after her surgery with [place].  - Urinalysis, Complete  2.   Atrophic vaginitis:   Patient is given samples of Premarin cream. Perhaps  the ingredients in this brand of vaginal cream will be less irritating to her vaginal mucosa.  We will reassess when she returns after her back surgery.   No Follow-up on file.  Zara Council, [facility] [address], [place], [place] [address] [phone]

## 2015-03-05 LAB — CULTURE, URINE COMPREHENSIVE

## 2015-03-07 ENCOUNTER — Encounter (HOSPITAL_COMMUNITY): Payer: Self-pay

## 2015-03-07 ENCOUNTER — Encounter (HOSPITAL_COMMUNITY)
Admission: RE | Admit: 2015-03-07 | Discharge: 2015-03-07 | Disposition: A | Discharge status: Home / Self Care | Payer: Medicare Other | Source: Ambulatory Visit | Attending: Neurosurgery | Admitting: Neurosurgery

## 2015-03-07 ENCOUNTER — Telehealth: Payer: Self-pay

## 2015-03-07 DIAGNOSIS — Z7982 Long term (current) use of aspirin: Secondary | ICD-10-CM | POA: Insufficient documentation

## 2015-03-07 DIAGNOSIS — Z01812 Encounter for preprocedural laboratory examination: Secondary | ICD-10-CM | POA: Insufficient documentation

## 2015-03-07 DIAGNOSIS — E785 Hyperlipidemia, unspecified: Secondary | ICD-10-CM | POA: Insufficient documentation

## 2015-03-07 DIAGNOSIS — R9431 Abnormal electrocardiogram [ECG] [EKG]: Secondary | ICD-10-CM | POA: Insufficient documentation

## 2015-03-07 DIAGNOSIS — E119 Type 2 diabetes mellitus without complications: Secondary | ICD-10-CM | POA: Insufficient documentation

## 2015-03-07 DIAGNOSIS — K219 Gastro-esophageal reflux disease without esophagitis: Secondary | ICD-10-CM | POA: Insufficient documentation

## 2015-03-07 DIAGNOSIS — Z79899 Other long term (current) drug therapy: Secondary | ICD-10-CM | POA: Insufficient documentation

## 2015-03-07 DIAGNOSIS — Z01818 Encounter for other preprocedural examination: Secondary | ICD-10-CM | POA: Insufficient documentation

## 2015-03-07 DIAGNOSIS — I10 Essential (primary) hypertension: Secondary | ICD-10-CM | POA: Insufficient documentation

## 2015-03-07 HISTORY — DX: Urinary tract infection, site not specified: N39.0

## 2015-03-07 HISTORY — DX: Cystitis, unspecified without hematuria: N30.90

## 2015-03-07 LAB — SURGICAL PCR SCREEN
MRSA, PCR: NEGATIVE
Staphylococcus aureus: NEGATIVE

## 2015-03-07 LAB — CBC
HEMATOCRIT: 37 % (ref 36.0–46.0)
HEMOGLOBIN: 12 g/dL (ref 12.0–15.0)
MCH: 30 pg (ref 26.0–34.0)
MCHC: 32.4 g/dL (ref 30.0–36.0)
MCV: 92.5 fL (ref 78.0–100.0)
PLATELETS: 177 10*3/uL (ref 150–400)
RBC: 4 MIL/uL (ref 3.87–5.11)
RDW: 13.8 % (ref 11.5–15.5)
WBC: 5.9 10*3/uL (ref 4.0–10.5)

## 2015-03-07 LAB — BASIC METABOLIC PANEL
Anion gap: 7 (ref 5–15)
BUN: 10 mg/dL (ref 6–20)
CALCIUM: 9.8 mg/dL (ref 8.9–10.3)
CHLORIDE: 107 mmol/L (ref 101–111)
CO2: 24 mmol/L (ref 22–32)
Creatinine, Ser: 0.86 mg/dL (ref 0.44–1.00)
GFR calc non Af Amer: 60 mL/min — ABNORMAL LOW (ref >60)
GLUCOSE: 123 mg/dL — AB (ref 65–99)
POTASSIUM: 3.9 mmol/L (ref 3.5–5.1)
Sodium: 138 mmol/L (ref 135–145)

## 2015-03-07 LAB — GLUCOSE, CAPILLARY: Glucose-Capillary: 99 mg/dL (ref 65–99)

## 2015-03-07 LAB — NO BLOOD PRODUCTS

## 2015-03-07 NOTE — Telephone Encounter (Signed)
Spoke with pt again. Pt c/o having pain but no fevers associated with loose stools. Advised pt to call PCP, pt refused ED, for C. Diff testing. Pt voiced understanding. Cw,lpn

## 2015-03-07 NOTE — Telephone Encounter (Signed)
Spoke with the pt who stated she is currently taking macrobid. Pt c/o having diarrhea while on medication. Please advise. Cw,lpn

## 2015-03-07 NOTE — Telephone Encounter (Signed)
Not sensitive to anything else oral.  Please ensure that she is drinking a large amount of water to keep her well hydrated.  If her loose stool is profuse, or associated with pain or fevers, she may consider C. Diff testing by either her PCP or the ED.    Hollice Espy, MD

## 2015-03-07 NOTE — Progress Notes (Signed)
PATIENT STATED SHE WAS BEING TREATED FOR UTI AND HAS CHRONIC CYSTITIS. PATIENT STATED SHE WILL FINISH ANTIBIOTIC ON Thursday, INSTRUCTED PATIENT TO NOTIFY DR. Arnoldo Morale OFFICE IF NOT BETTER BY Friday.

## 2015-03-07 NOTE — Pre-Procedure Instructions (Addendum)
Lynn Wall  03/07/2015      [id number] - [place], [place] - [address] [place] [address] Phone: [phone] Fax: [phone]    Your procedure is scheduled on   Monday 03/14/15  Report to [facility] Admitting at 700 A.M.  Call this number if you have problems the morning of surgery:  [phone]   Remember:  Do not eat food or drink liquids after midnight.  Take these medicines the morning of surgery with A SIP OF WATER   PREMARIN, GABAPENTIN, METOPROLOL(LOPRESSOR), EVISTA    (STOP NAPROXEN/ ANAPROX/ ALEVE, ASPIRIN, COUMADIN, PLAVIX, EFFIENT, HERBAL MEDICINES)   Do not wear jewelry, make-up or nail polish.  Do not wear lotions, powders, or perfumes.  You may wear deodorant.  Do not shave 48 hours prior to surgery.  Men may shave face and neck.  Do not bring valuables to the hospital.  [facility] is not responsible for any belongings or valuables.  Contacts, dentures or bridgework may not be worn into surgery.  Leave your suitcase in the car.  After surgery it may be brought to your room.  For patients admitted to the hospital, discharge time will be determined by your treatment team.  Patients discharged the day of surgery will not be allowed to drive home.   Name and phone number of your driver:    Special instructions:  [place] - Preparing for Surgery  Before surgery, you can play an important role.  Because skin is not sterile, your skin needs to be as free of germs as possible.  You can reduce the number of germs on you skin by washing with CHG (chlorahexidine gluconate) soap before surgery.  CHG is an antiseptic cleaner which kills germs and bonds with the skin to continue killing germs even after washing.  Please DO NOT use if you have an allergy to CHG or antibacterial soaps.  If your skin becomes reddened/irritated stop using the CHG and inform your nurse when you arrive at Short Stay.  Do not shave  (including legs and underarms) for at least 48 hours prior to the first CHG shower.  You may shave your face.  Please follow these instructions carefully:   1.  Shower with CHG Soap the night before surgery and the                                morning of Surgery.  2.  If you choose to wash your hair, wash your hair first as usual with your       normal shampoo.  3.  After you shampoo, rinse your hair and body thoroughly to remove the                      Shampoo.  4.  Use CHG as you would any other liquid soap.  You can apply chg directly       to the skin and wash gently with scrungie or a clean washcloth.  5.  Apply the CHG Soap to your body ONLY FROM THE NECK DOWN.        Do not use on open wounds or open sores.  Avoid contact with your eyes,       ears, mouth and genitals (private parts).  Wash genitals (private parts)       with your normal soap.  6.  Wash thoroughly, paying special attention to the area where your surgery        will be performed.  7.  Thoroughly rinse your body with warm water from the neck down.  8.  DO NOT shower/wash with your normal soap after using and rinsing off       the CHG Soap.  9.  Pat yourself dry with a clean towel.            10.  Wear clean pajamas.            11.  Place clean sheets on your bed the night of your first shower and do not        sleep with pets.  Day of Surgery  Do not apply any lotions/deoderants the morning of surgery.  Please wear clean clothes to the hospital/surgery center.    Please read over the following fact sheets that you were given. Pain Booklet, Coughing and Deep Breathing, MRSA Information and Surgical Site Infection Prevention

## 2015-03-07 NOTE — Telephone Encounter (Signed)
-----   Message from Hollice Espy, MD sent at 03/07/2015 12:23 PM EDT ----- UCx positive.  Looks like Larene Beach started her on Macrobid which is appropriate.  Please confirm with patient that she is taking this medication.  Hollice Espy, MD

## 2015-03-08 LAB — HEMOGLOBIN A1C
Hgb A1c MFr Bld: 6.7 % — ABNORMAL HIGH (ref 4.8–5.6)
Mean Plasma Glucose: 146 mg/dL

## 2015-03-08 NOTE — Progress Notes (Addendum)
Anesthesia Chart Review:  Pt is 81 year old female scheduled for L3-4 lumbar laminectomy/ decompression microdiscectomy on 03/14/2015 with Dr. Arnoldo Morale.   Pt is Jehovah's Witness.   PMH includes: HTN, DM, GERD, hyperlipidemia. Never smoker. BMI 25.5.   Medications include: ASA, enalapril, metformin, metoprolol, simvastatin, raloxifene.   Preoperative labs reviewed.  HgbA1c 6.7  EKG 03/07/2015: NSR. Possible Anterior infarct, age undetermined. No significant change since last tracing 05/18/2009 per Dr. Kennon Holter interpretation. Appears similar to tracings from 1997.   If no changes, I anticipate pt can proceed with surgery as scheduled.   Willeen Cass, FNP-BC [facility] Stay [facility]/Anesthesiology Phone: [phone] 03/09/2015 5:12 PM

## 2015-03-09 ENCOUNTER — Telehealth: Payer: Self-pay | Admitting: Urology

## 2015-03-09 ENCOUNTER — Other Ambulatory Visit: Payer: Self-pay

## 2015-03-09 DIAGNOSIS — R197 Diarrhea, unspecified: Secondary | ICD-10-CM | POA: Diagnosis not present

## 2015-03-09 NOTE — Telephone Encounter (Signed)
Spoke with patient's daughter and she states that patient is still having terrible diarrhea and stomach cramping. Per Dr. Cherrie Gauze last message and after speaking with her again patient's daughter was instructed to bring in a stool sample so that we can test for C DIFF. Patient will also bring a UA on Friday to recheck and make sure infection is clear, patient is completing abx today. Patient is scheduled to have back surgery on Monday.

## 2015-03-11 ENCOUNTER — Telehealth: Payer: Self-pay | Admitting: Urology

## 2015-03-11 ENCOUNTER — Other Ambulatory Visit: Payer: Medicare Other

## 2015-03-11 DIAGNOSIS — N390 Urinary tract infection, site not specified: Secondary | ICD-10-CM

## 2015-03-11 LAB — URINALYSIS, COMPLETE
Bilirubin, UA: NEGATIVE
GLUCOSE, UA: NEGATIVE
KETONES UA: NEGATIVE
NITRITE UA: NEGATIVE
Protein, UA: NEGATIVE
RBC UA: NEGATIVE
Specific Gravity, UA: 1.01 (ref 1.005–1.030)
UUROB: 0.2 mg/dL (ref 0.2–1.0)
pH, UA: 6 (ref 5.0–7.5)

## 2015-03-11 LAB — MICROSCOPIC EXAMINATION: BACTERIA UA: NONE SEEN

## 2015-03-11 LAB — CLOSTRIDIUM DIFFICILE BY PCR: Toxigenic C. Difficile by PCR: NEGATIVE

## 2015-03-11 NOTE — Telephone Encounter (Signed)
Please let this patient know her C. Diff was negative!!! This is great news.  Hopefully her loose stool is improving.  Make sure she is drinking plenty of water.    Hollice Espy, MD

## 2015-03-11 NOTE — Telephone Encounter (Signed)
Pt came into office and Judson Roch handled it. Cw,lpn

## 2015-03-11 NOTE — Progress Notes (Signed)
Patient was notified C Diff results came back negative. Patient was told since she was done with the abx that she could try OTC anti- diarrheals. Patient was given UA results before leaving and was told UA was clear.

## 2015-03-14 ENCOUNTER — Ambulatory Visit (HOSPITAL_COMMUNITY): Payer: Medicare Other

## 2015-03-14 ENCOUNTER — Encounter (HOSPITAL_COMMUNITY)
Admission: RE | Disposition: A | Discharge status: Home / Self Care | Payer: Self-pay | Source: Ambulatory Visit | Attending: Neurosurgery

## 2015-03-14 ENCOUNTER — Encounter (HOSPITAL_COMMUNITY): Payer: Self-pay | Admitting: *Deleted

## 2015-03-14 ENCOUNTER — Ambulatory Visit (HOSPITAL_COMMUNITY)
Admission: RE | Admit: 2015-03-14 | Discharge: 2015-03-15 | Disposition: A | Discharge status: Home / Self Care | Payer: Medicare Other | Source: Ambulatory Visit | Attending: Neurosurgery | Admitting: Neurosurgery

## 2015-03-14 ENCOUNTER — Ambulatory Visit (HOSPITAL_COMMUNITY): Payer: Medicare Other | Admitting: Emergency Medicine

## 2015-03-14 ENCOUNTER — Ambulatory Visit (HOSPITAL_COMMUNITY): Payer: Medicare Other | Admitting: Anesthesiology

## 2015-03-14 DIAGNOSIS — Z7982 Long term (current) use of aspirin: Secondary | ICD-10-CM | POA: Insufficient documentation

## 2015-03-14 DIAGNOSIS — Z79899 Other long term (current) drug therapy: Secondary | ICD-10-CM | POA: Diagnosis not present

## 2015-03-14 DIAGNOSIS — I10 Essential (primary) hypertension: Secondary | ICD-10-CM | POA: Insufficient documentation

## 2015-03-14 DIAGNOSIS — Z87442 Personal history of urinary calculi: Secondary | ICD-10-CM | POA: Diagnosis not present

## 2015-03-14 DIAGNOSIS — M4806 Spinal stenosis, lumbar region: Secondary | ICD-10-CM | POA: Diagnosis not present

## 2015-03-14 DIAGNOSIS — M5116 Intervertebral disc disorders with radiculopathy, lumbar region: Secondary | ICD-10-CM | POA: Insufficient documentation

## 2015-03-14 DIAGNOSIS — E785 Hyperlipidemia, unspecified: Secondary | ICD-10-CM | POA: Diagnosis not present

## 2015-03-14 DIAGNOSIS — E119 Type 2 diabetes mellitus without complications: Secondary | ICD-10-CM | POA: Diagnosis not present

## 2015-03-14 DIAGNOSIS — M810 Age-related osteoporosis without current pathological fracture: Secondary | ICD-10-CM | POA: Insufficient documentation

## 2015-03-14 DIAGNOSIS — N390 Urinary tract infection, site not specified: Secondary | ICD-10-CM | POA: Insufficient documentation

## 2015-03-14 DIAGNOSIS — K219 Gastro-esophageal reflux disease without esophagitis: Secondary | ICD-10-CM | POA: Diagnosis not present

## 2015-03-14 DIAGNOSIS — M5126 Other intervertebral disc displacement, lumbar region: Secondary | ICD-10-CM | POA: Diagnosis not present

## 2015-03-14 DIAGNOSIS — M48061 Spinal stenosis, lumbar region without neurogenic claudication: Secondary | ICD-10-CM | POA: Diagnosis present

## 2015-03-14 DIAGNOSIS — M549 Dorsalgia, unspecified: Secondary | ICD-10-CM

## 2015-03-14 DIAGNOSIS — Z9889 Other specified postprocedural states: Secondary | ICD-10-CM | POA: Diagnosis not present

## 2015-03-14 DIAGNOSIS — M5416 Radiculopathy, lumbar region: Secondary | ICD-10-CM | POA: Diagnosis not present

## 2015-03-14 HISTORY — PX: LUMBAR LAMINECTOMY/DECOMPRESSION MICRODISCECTOMY: SHX5026

## 2015-03-14 LAB — URINALYSIS, ROUTINE W REFLEX MICROSCOPIC
Bilirubin Urine: NEGATIVE
Glucose, UA: NEGATIVE mg/dL
Hgb urine dipstick: NEGATIVE
KETONES UR: 15 mg/dL — AB
Leukocytes, UA: NEGATIVE
Nitrite: NEGATIVE
Protein, ur: NEGATIVE mg/dL
Specific Gravity, Urine: 1.011 (ref 1.005–1.030)
Urobilinogen, UA: 0.2 mg/dL (ref 0.0–1.0)
pH: 6 (ref 5.0–8.0)

## 2015-03-14 LAB — GLUCOSE, CAPILLARY
GLUCOSE-CAPILLARY: 119 mg/dL — AB (ref 65–99)
GLUCOSE-CAPILLARY: 151 mg/dL — AB (ref 65–99)
GLUCOSE-CAPILLARY: 219 mg/dL — AB (ref 65–99)
Glucose-Capillary: 158 mg/dL — ABNORMAL HIGH (ref 65–99)

## 2015-03-14 SURGERY — LUMBAR LAMINECTOMY/DECOMPRESSION MICRODISCECTOMY 1 LEVEL
Anesthesia: General | Site: Back | Laterality: Left

## 2015-03-14 MED ORDER — ONDANSETRON HCL 4 MG/2ML IJ SOLN: 4.0000 mg | INTRAMUSCULAR | Status: DC | PRN

## 2015-03-14 MED ORDER — LIDOCAINE HCL (CARDIAC) 20 MG/ML IV SOLN
INTRAVENOUS | Status: AC
  Filled 2015-03-14: qty 5

## 2015-03-14 MED ORDER — MENTHOL 3 MG MT LOZG: 1.0000 | LOZENGE | OROMUCOSAL | Status: DC | PRN

## 2015-03-14 MED ORDER — NAPROXEN SODIUM 220 MG PO TABS: 220.0000 mg | ORAL_TABLET | Freq: Two times a day (BID) | ORAL | Status: DC | PRN

## 2015-03-14 MED ORDER — LACTATED RINGERS IV SOLN: INTRAVENOUS | Status: DC

## 2015-03-14 MED ORDER — BISACODYL 10 MG RE SUPP: 10.0000 mg | Freq: Every day | RECTAL | Status: DC | PRN

## 2015-03-14 MED ORDER — BUPIVACAINE-EPINEPHRINE (PF) 0.5% -1:200000 IJ SOLN
INTRAMUSCULAR | Status: DC | PRN
  Administered 2015-03-14: 10 mL via PERINEURAL

## 2015-03-14 MED ORDER — CEFAZOLIN SODIUM-DEXTROSE 2-3 GM-% IV SOLR
2.0000 g | Freq: Three times a day (TID) | INTRAVENOUS | Status: AC
  Administered 2015-03-14 – 2015-03-15 (×2): 2 g via INTRAVENOUS
  Filled 2015-03-14 (×2): qty 50

## 2015-03-14 MED ORDER — EPHEDRINE SULFATE 50 MG/ML IJ SOLN
INTRAMUSCULAR | Status: DC | PRN
  Administered 2015-03-14: 5 mg via INTRAVENOUS

## 2015-03-14 MED ORDER — LIDOCAINE HCL (CARDIAC) 20 MG/ML IV SOLN
INTRAVENOUS | Status: DC | PRN
  Administered 2015-03-14: 20 mg via INTRAVENOUS

## 2015-03-14 MED ORDER — CEFAZOLIN SODIUM-DEXTROSE 2-3 GM-% IV SOLR
2.0000 g | INTRAVENOUS | Status: AC
  Administered 2015-03-14: 2 g via INTRAVENOUS

## 2015-03-14 MED ORDER — DIAZEPAM 5 MG PO TABS: 5.0000 mg | ORAL_TABLET | Freq: Four times a day (QID) | ORAL | Status: DC | PRN

## 2015-03-14 MED ORDER — DOCUSATE SODIUM 100 MG PO CAPS
100.0000 mg | ORAL_CAPSULE | Freq: Two times a day (BID) | ORAL | Status: DC
  Administered 2015-03-14 – 2015-03-15 (×2): 100 mg via ORAL
  Filled 2015-03-14: qty 1

## 2015-03-14 MED ORDER — RALOXIFENE HCL 60 MG PO TABS
60.0000 mg | ORAL_TABLET | Freq: Every day | ORAL | Status: DC
  Administered 2015-03-15: 60 mg via ORAL
  Filled 2015-03-14: qty 1

## 2015-03-14 MED ORDER — ENALAPRIL MALEATE 10 MG PO TABS
10.0000 mg | ORAL_TABLET | Freq: Every day | ORAL | Status: DC
  Administered 2015-03-14 – 2015-03-15 (×2): 10 mg via ORAL
  Filled 2015-03-14 (×2): qty 1

## 2015-03-14 MED ORDER — GABAPENTIN 100 MG PO CAPS
100.0000 mg | ORAL_CAPSULE | Freq: Three times a day (TID) | ORAL | Status: DC | PRN
  Filled 2015-03-14: qty 1

## 2015-03-14 MED ORDER — DEXAMETHASONE SODIUM PHOSPHATE 4 MG/ML IJ SOLN
INTRAMUSCULAR | Status: AC
  Filled 2015-03-14: qty 2

## 2015-03-14 MED ORDER — NEOSTIGMINE METHYLSULFATE 10 MG/10ML IV SOLN
INTRAVENOUS | Status: DC | PRN
  Administered 2015-03-14: 4 mg via INTRAVENOUS

## 2015-03-14 MED ORDER — PROPOFOL 10 MG/ML IV BOLUS
INTRAVENOUS | Status: DC | PRN
  Administered 2015-03-14: 80 mg via INTRAVENOUS

## 2015-03-14 MED ORDER — HEMOSTATIC AGENTS (NO CHARGE) OPTIME
TOPICAL | Status: DC | PRN
  Administered 2015-03-14: 1 via TOPICAL

## 2015-03-14 MED ORDER — ROCURONIUM BROMIDE 50 MG/5ML IV SOLN
INTRAVENOUS | Status: AC
  Filled 2015-03-14: qty 1

## 2015-03-14 MED ORDER — ARTIFICIAL TEARS OP OINT
TOPICAL_OINTMENT | OPHTHALMIC | Status: DC | PRN
  Administered 2015-03-14: 1 via OPHTHALMIC

## 2015-03-14 MED ORDER — METOPROLOL TARTRATE 25 MG PO TABS
25.0000 mg | ORAL_TABLET | Freq: Two times a day (BID) | ORAL | Status: DC
  Administered 2015-03-14 – 2015-03-15 (×2): 25 mg via ORAL
  Filled 2015-03-14 (×3): qty 1

## 2015-03-14 MED ORDER — FENTANYL CITRATE (PF) 100 MCG/2ML IJ SOLN: 25.0000 ug | INTRAMUSCULAR | Status: DC | PRN

## 2015-03-14 MED ORDER — PROPOFOL 10 MG/ML IV BOLUS
INTRAVENOUS | Status: AC
  Filled 2015-03-14: qty 20

## 2015-03-14 MED ORDER — LACTATED RINGERS IV SOLN
INTRAVENOUS | Status: DC | PRN
  Administered 2015-03-14 (×2): via INTRAVENOUS

## 2015-03-14 MED ORDER — FENTANYL CITRATE (PF) 250 MCG/5ML IJ SOLN
INTRAMUSCULAR | Status: AC
  Filled 2015-03-14: qty 5

## 2015-03-14 MED ORDER — ONDANSETRON HCL 4 MG/2ML IJ SOLN
INTRAMUSCULAR | Status: DC | PRN
  Administered 2015-03-14: 4 mg via INTRAVENOUS

## 2015-03-14 MED ORDER — ROCURONIUM BROMIDE 100 MG/10ML IV SOLN
INTRAVENOUS | Status: DC | PRN
  Administered 2015-03-14: 40 mg via INTRAVENOUS

## 2015-03-14 MED ORDER — FENTANYL CITRATE (PF) 100 MCG/2ML IJ SOLN
INTRAMUSCULAR | Status: DC | PRN
  Administered 2015-03-14 (×2): 50 ug via INTRAVENOUS
  Administered 2015-03-14: 100 ug via INTRAVENOUS

## 2015-03-14 MED ORDER — PHENOL 1.4 % MT LIQD: 1.0000 | OROMUCOSAL | Status: DC | PRN

## 2015-03-14 MED ORDER — ONDANSETRON HCL 4 MG/2ML IJ SOLN
INTRAMUSCULAR | Status: AC
  Filled 2015-03-14: qty 2

## 2015-03-14 MED ORDER — PHENYLEPHRINE HCL 10 MG/ML IJ SOLN
INTRAMUSCULAR | Status: DC | PRN
  Administered 2015-03-14: 80 ug via INTRAVENOUS

## 2015-03-14 MED ORDER — METFORMIN HCL 500 MG PO TABS
500.0000 mg | ORAL_TABLET | Freq: Two times a day (BID) | ORAL | Status: DC
  Administered 2015-03-14 – 2015-03-15 (×2): 500 mg via ORAL
  Filled 2015-03-14 (×4): qty 1

## 2015-03-14 MED ORDER — DEXAMETHASONE SODIUM PHOSPHATE 4 MG/ML IJ SOLN
INTRAMUSCULAR | Status: DC | PRN
  Administered 2015-03-14: 8 mg via INTRAVENOUS

## 2015-03-14 MED ORDER — OXYCODONE-ACETAMINOPHEN 5-325 MG PO TABS: 1.0000 | ORAL_TABLET | ORAL | Status: DC | PRN

## 2015-03-14 MED ORDER — GLYCOPYRROLATE 0.2 MG/ML IJ SOLN
INTRAMUSCULAR | Status: DC | PRN
  Administered 2015-03-14: 0.6 mg via INTRAVENOUS

## 2015-03-14 MED ORDER — SIMVASTATIN 40 MG PO TABS
40.0000 mg | ORAL_TABLET | Freq: Every day | ORAL | Status: DC
  Administered 2015-03-14: 40 mg via ORAL
  Filled 2015-03-14 (×2): qty 1

## 2015-03-14 MED ORDER — ACETAMINOPHEN 325 MG PO TABS: 650.0000 mg | ORAL_TABLET | ORAL | Status: DC | PRN

## 2015-03-14 MED ORDER — ALUM & MAG HYDROXIDE-SIMETH 200-200-20 MG/5ML PO SUSP: 30.0000 mL | Freq: Four times a day (QID) | ORAL | Status: DC | PRN

## 2015-03-14 MED ORDER — THROMBIN 5000 UNITS EX SOLR
CUTANEOUS | Status: DC | PRN
  Administered 2015-03-14 (×2): 5000 [IU] via TOPICAL

## 2015-03-14 MED ORDER — LACTATED RINGERS IV SOLN
INTRAVENOUS | Status: DC
  Administered 2015-03-14: 08:00:00 via INTRAVENOUS

## 2015-03-14 MED ORDER — MORPHINE SULFATE 2 MG/ML IJ SOLN: 1.0000 mg | INTRAMUSCULAR | Status: DC | PRN

## 2015-03-14 MED ORDER — BACITRACIN ZINC 500 UNIT/GM EX OINT
TOPICAL_OINTMENT | CUTANEOUS | Status: DC | PRN
  Administered 2015-03-14: 1 via TOPICAL

## 2015-03-14 MED ORDER — 0.9 % SODIUM CHLORIDE (POUR BTL) OPTIME
TOPICAL | Status: DC | PRN
  Administered 2015-03-14: 1000 mL

## 2015-03-14 MED ORDER — SODIUM CHLORIDE 0.9 % IR SOLN
Status: DC | PRN
  Administered 2015-03-14: 500 mL

## 2015-03-14 MED ORDER — ACETAMINOPHEN 650 MG RE SUPP: 650.0000 mg | RECTAL | Status: DC | PRN

## 2015-03-14 MED ORDER — HYDROCODONE-ACETAMINOPHEN 5-325 MG PO TABS
1.0000 | ORAL_TABLET | ORAL | Status: DC | PRN
  Administered 2015-03-15: 1 via ORAL
  Filled 2015-03-14: qty 1

## 2015-03-14 SURGICAL SUPPLY — 51 items
BAG DECANTER FOR FLEXI CONT (MISCELLANEOUS) ×3 IMPLANT
BENZOIN TINCTURE PRP APPL 2/3 (GAUZE/BANDAGES/DRESSINGS) ×3 IMPLANT
BLADE CLIPPER SURG (BLADE) IMPLANT
BRUSH SCRUB EZ PLAIN DRY (MISCELLANEOUS) ×3 IMPLANT
BUR MATCHSTICK NEURO 3.0 LAGG (BURR) ×3 IMPLANT
BUR PRECISION FLUTE 6.0 (BURR) ×3 IMPLANT
CANISTER SUCT 3000ML PPV (MISCELLANEOUS) ×3 IMPLANT
CLOSURE WOUND 1/2 X4 (GAUZE/BANDAGES/DRESSINGS) ×1
CONT SPEC 4OZ CLIKSEAL STRL BL (MISCELLANEOUS) ×3 IMPLANT
DRAPE LAPAROTOMY 100X72X124 (DRAPES) ×3 IMPLANT
DRAPE MICROSCOPE LEICA (MISCELLANEOUS) ×3 IMPLANT
DRAPE POUCH INSTRU U-SHP 10X18 (DRAPES) ×3 IMPLANT
DRAPE SURG 17X23 STRL (DRAPES) ×12 IMPLANT
ELECT BLADE 4.0 EZ CLEAN MEGAD (MISCELLANEOUS) ×3
ELECT REM PT RETURN 9FT ADLT (ELECTROSURGICAL) ×3
ELECTRODE BLDE 4.0 EZ CLN MEGD (MISCELLANEOUS) ×1 IMPLANT
ELECTRODE REM PT RTRN 9FT ADLT (ELECTROSURGICAL) ×1 IMPLANT
GAUZE SPONGE 4X4 12PLY STRL (GAUZE/BANDAGES/DRESSINGS) ×3 IMPLANT
GAUZE SPONGE 4X4 16PLY XRAY LF (GAUZE/BANDAGES/DRESSINGS) IMPLANT
GLOVE BIO SURGEON STRL SZ8 (GLOVE) ×3 IMPLANT
GLOVE BIO SURGEON STRL SZ8.5 (GLOVE) ×3 IMPLANT
GLOVE EXAM NITRILE LRG STRL (GLOVE) IMPLANT
GLOVE EXAM NITRILE MD LF STRL (GLOVE) IMPLANT
GLOVE EXAM NITRILE XL STR (GLOVE) IMPLANT
GLOVE EXAM NITRILE XS STR PU (GLOVE) IMPLANT
GOWN STRL REUS W/ TWL LRG LVL3 (GOWN DISPOSABLE) IMPLANT
GOWN STRL REUS W/ TWL XL LVL3 (GOWN DISPOSABLE) ×1 IMPLANT
GOWN STRL REUS W/TWL 2XL LVL3 (GOWN DISPOSABLE) IMPLANT
GOWN STRL REUS W/TWL LRG LVL3 (GOWN DISPOSABLE)
GOWN STRL REUS W/TWL XL LVL3 (GOWN DISPOSABLE) ×2
KIT BASIN OR (CUSTOM PROCEDURE TRAY) ×3 IMPLANT
KIT ROOM TURNOVER OR (KITS) ×3 IMPLANT
NEEDLE HYPO 21X1.5 SAFETY (NEEDLE) IMPLANT
NEEDLE HYPO 22GX1.5 SAFETY (NEEDLE) ×3 IMPLANT
NS IRRIG 1000ML POUR BTL (IV SOLUTION) ×3 IMPLANT
PACK LAMINECTOMY NEURO (CUSTOM PROCEDURE TRAY) ×3 IMPLANT
PAD ARMBOARD 7.5X6 YLW CONV (MISCELLANEOUS) ×9 IMPLANT
PATTIES SURGICAL .5 X1 (DISPOSABLE) IMPLANT
RUBBERBAND STERILE (MISCELLANEOUS) ×6 IMPLANT
SPONGE SURGIFOAM ABS GEL SZ50 (HEMOSTASIS) ×3 IMPLANT
STRIP CLOSURE SKIN 1/2X4 (GAUZE/BANDAGES/DRESSINGS) ×2 IMPLANT
SUT VIC AB 1 CT1 18XBRD ANBCTR (SUTURE) ×1 IMPLANT
SUT VIC AB 1 CT1 8-18 (SUTURE) ×2
SUT VIC AB 2-0 CP2 18 (SUTURE) ×3 IMPLANT
SYR 20CC LL (SYRINGE) IMPLANT
SYR 20ML ECCENTRIC (SYRINGE) ×3 IMPLANT
TAPE CLOTH SURG 4X10 WHT LF (GAUZE/BANDAGES/DRESSINGS) ×3 IMPLANT
TAPE STRIPS DRAPE STRL (GAUZE/BANDAGES/DRESSINGS) ×3 IMPLANT
TOWEL OR 17X24 6PK STRL BLUE (TOWEL DISPOSABLE) ×3 IMPLANT
TOWEL OR 17X26 10 PK STRL BLUE (TOWEL DISPOSABLE) ×3 IMPLANT
WATER STERILE IRR 1000ML POUR (IV SOLUTION) ×3 IMPLANT

## 2015-03-14 NOTE — Transfer of Care (Signed)
Immediate Anesthesia Transfer of Care Note  Patient: Lynn Wall  Procedure(s) Performed: Procedure(s) with comments: LUMBAR LAMINECTOMY/DECOMPRESSION MICRODISCECTOMY 1 LEVEL (Left) - Left L34 microdiskectomy  Patient Location: PACU  Anesthesia Type:General  Level of Consciousness: awake, alert  and oriented  Airway & Oxygen Therapy: Patient Spontanous Breathing and Patient connected to nasal cannula oxygen  Post-op Assessment: Report given to RN and Post -op Vital signs reviewed and stable  Post vital signs: Reviewed and stable  Last Vitals:  Filed Vitals:   03/14/15 0803  BP: 166/78  Pulse:   Temp:   Resp:     Complications: No apparent anesthesia complications

## 2015-03-14 NOTE — Anesthesia Preprocedure Evaluation (Addendum)
Anesthesia Evaluation  Patient identified by MRN, date of birth, ID band Patient awake    Reviewed: Allergy & Precautions, NPO status , Patient's Chart, lab work & pertinent test results, reviewed documented beta blocker date and time   History of Anesthesia Complications Negative for: history of anesthetic complications  Airway Mallampati: II  TM Distance: >3 FB Neck ROM: Full    Dental  (+) Edentulous Upper, Dental Advisory Given   Pulmonary neg pulmonary ROS,  breath sounds clear to auscultation        Cardiovascular hypertension, Pt. on medications and Pt. on home beta blockers - anginaRhythm:Regular Rate:Normal     Neuro/Psych Chronic back pain    GI/Hepatic Neg liver ROS, GERD-  Controlled,  Endo/Other  diabetes (glu 119), Oral Hypoglycemic Agents  Renal/GU negative Renal ROS     Musculoskeletal  (+) Arthritis -, Osteoarthritis,    Abdominal   Peds  Hematology   Anesthesia Other Findings   Reproductive/Obstetrics                            Anesthesia Physical Anesthesia Plan  ASA: II  Anesthesia Plan: General   Post-op Pain Management:    Induction: Intravenous  Airway Management Planned: Oral ETT  Additional Equipment:   Intra-op Plan:   Post-operative Plan: Extubation in OR  Informed Consent: I have reviewed the patients History and Physical, chart, labs and discussed the procedure including the risks, benefits and alternatives for the proposed anesthesia with the patient or authorized representative who has indicated his/her understanding and acceptance.   Dental advisory given  Plan Discussed with: CRNA and Surgeon  Anesthesia Plan Comments: (Plan routine monitors, GETA)        Anesthesia Quick Evaluation

## 2015-03-14 NOTE — Evaluation (Signed)
Physical Therapy Evaluation Patient Details Name: Lynn Wall MRN: [id number] DOB: 12-18-1926 Today's Date: 03/14/2015   History of Present Illness  Patient is a 81 y/o female s/p L3-4 decompression/fusion and microdiskectomy. PMH includes DM, HTN, HLD, UTI.  Clinical Impression  Patient presents with generalized weakness and balance deficits s/p above surgery impacting mobility. Education provided on back precautions. Pt highly motivated to return to PLOF. Tolerated ambulation and stair training today. Will follow acutely to maximize independence and mobility prior to return home. Would benefit from stair training one more time in AM prior to d/c with family present.      Follow Up Recommendations Supervision/Assistance - 24 hour OPPT once cleared by MD.    Equipment Recommendations  None recommended by PT    Recommendations for Other Services       Precautions / Restrictions Precautions Precautions: Back Precaution Booklet Issued: No Restrictions Weight Bearing Restrictions: No      Mobility  Bed Mobility Overal bed mobility: Modified Independent;Needs Assistance Bed Mobility: Rolling;Sidelying to Sit;Sit to Sidelying Rolling: Supervision Sidelying to sit: Supervision     Sit to sidelying: Supervision General bed mobility comments: HOB flat, no use of rails to simulate home environment. Cues for log roll technique.  Transfers Overall transfer level: Needs assistance Equipment used: None Transfers: Sit to/from Stand Sit to Stand: Supervision         General transfer comment: Supervision for safety.   Ambulation/Gait Ambulation/Gait assistance: Min guard Ambulation Distance (Feet): 200 Feet Assistive device: None Gait Pattern/deviations: Step-through pattern;Decreased stride length   Gait velocity interpretation: Below normal speed for age/gender General Gait Details: Slow, guarded gait. Reaching for hand rail at times for support.   Stairs            Wheelchair Mobility    Modified Rankin (Stroke Patients Only)       Balance Overall balance assessment: Needs assistance Sitting-balance support: Feet supported;No upper extremity supported Sitting balance-Leahy Scale: Good     Standing balance support: During functional activity Standing balance-Leahy Scale: Fair                               Pertinent Vitals/Pain Pain Assessment: No/denies pain    Home Living Family/patient expects to be discharged to:: Private residence Living Arrangements: Children;Spouse/significant other Available Help at Discharge: Family;Available 24 hours/day Type of Home: House Home Access: Stairs to enter Entrance Stairs-Rails: Right Entrance Stairs-Number of Steps: 4 Home Layout: One level Home Equipment: Cane - single point      Prior Function Level of Independence: Independent with assistive device(s)         Comments: Pt using SPC PTA.      Hand Dominance        Extremity/Trunk Assessment   Upper Extremity Assessment: Defer to OT evaluation           Lower Extremity Assessment: Generalized weakness         Communication   Communication: No difficulties  Cognition Arousal/Alertness: Awake/alert Behavior During Therapy: WFL for tasks assessed/performed Overall Cognitive Status: Within Functional Limits for tasks assessed                      General Comments      Exercises        Assessment/Plan    PT Assessment Patient needs continued PT services  PT Diagnosis Difficulty walking;Generalized weakness   PT Problem List Decreased strength;Decreased activity  tolerance;Decreased balance;Decreased mobility  PT Treatment Interventions Balance training;Gait training;Stair training;Functional mobility training;Therapeutic activities;Therapeutic exercise;Patient/family education   PT Goals (Current goals can be found in the Care Plan section) Acute Rehab PT Goals Patient Stated Goal: to  be pain free PT Goal Formulation: With patient Time For Goal Achievement: 03/28/15 Potential to Achieve Goals: Good    Frequency Min 5X/week   Barriers to discharge        Co-evaluation               End of Session Equipment Utilized During Treatment: Gait belt Activity Tolerance: Patient tolerated treatment well Patient left: in bed;with call bell/phone within reach;with family/visitor present Nurse Communication: Mobility status    Functional Assessment Tool Used: Clinical judgment Functional Limitation: Mobility: Walking and moving around Mobility: Walking and Moving Around Current Status [phone]): At least 1 percent but less than 20 percent impaired, limited or restricted Mobility: Walking and Moving Around Goal Status [phone]): At least 1 percent but less than 20 percent impaired, limited or restricted    Time: 3329-5188 PT Time Calculation (min) (ACUTE ONLY): 30 min   Charges:   PT Evaluation $Initial PT Evaluation Tier I: 1 Procedure PT Treatments $Gait Training: 8-22 mins   PT G Codes:   PT G-Codes **NOT FOR INPATIENT CLASS** Functional Assessment Tool Used: Clinical judgment Functional Limitation: Mobility: Walking and moving around Mobility: Walking and Moving Around Current Status (C1660): At least 1 percent but less than 20 percent impaired, limited or restricted Mobility: Walking and Moving Around Goal Status [phone]): At least 1 percent but less than 20 percent impaired, limited or restricted    [place] 03/14/2015, [place] PM [place], [place], [place]

## 2015-03-14 NOTE — Progress Notes (Signed)
Patient ID: Lynn Wall, female   DOB: 08-Sep-1926, 81 y.o.   MRN: [id number] Subjective:  The patient is alert and pleasant. She complains of urinary retention. She has had a history of urinary tract infections and just stopped antibiotics a few days ago.  Objective: Vital signs in last 24 hours: Temp:  [97.5 F (36.4 C)-99.1 F (37.3 C)] 99.1 F (37.3 C) (07/18 1256) Pulse Rate:  [62-76] 64 (07/18 1256) Resp:  [15-40] 20 (07/18 1256) BP: (118-198)/(53-78) 149/66 mmHg (07/18 1256) SpO2:  [92 %-98 %] 96 % (07/18 1256)  Intake/Output from previous day:   Intake/Output this shift: Total I/O In: 1700 [P.O.:600; I.V.:1100] Out: 50 [Blood:50]  Physical exam the patient is alert and pleasant. Her strength is normal.  Lab Results: No results for input(s): WBC, HGB, HCT, PLT in the last 72 hours. BMET No results for input(s): NA, K, CL, CO2, GLUCOSE, BUN, CREATININE, CALCIUM in the last 72 hours.  Studies/Results: Dg Lumbar Spine 1 View  03/14/2015   CLINICAL DATA:  L3-4 discectomy.  EXAM: LUMBAR SPINE - 1 VIEW  COMPARISON:  MRI of October 07, 2014.  FINDINGS: Single cross-table intraoperative projection of the lumbar spine demonstrates a surgical probe directed toward the posterior portion of the L2-3 disc space. Multilevel degenerative disc disease is noted.  IMPRESSION: Surgical localization as described above.   Electronically Signed   By: Marijo Conception, M.D.   On: 03/14/2015 13:06    Assessment/Plan: The patient is doing well except for urinary retention. We will check a bladder scan. If she needs to be catheterized we will send off a UA C&S. She will likely go home tomorrow. I spoke with her family.      Chailyn Racette D 03/14/2015, 4:29 PM

## 2015-03-14 NOTE — Plan of Care (Signed)
Problem: Consults Goal: Diagnosis - Spinal Surgery Outcome: Completed/Met Date Met:  03/14/15 Microdiscectomy

## 2015-03-14 NOTE — Op Note (Signed)
Brief history: The patient is an 81 year old white female who has complained of back and left leg pain consistent with a lumbar radiculopathy. She has failed medical management. She was worked up with a lumbar MRI which demonstrated foraminal stenosis at L3-4 on the left. I discussed the various treatment options with the patient and her daughter. She has decided to proceed with surgery after weighing the risks, benefits, and alternatives.  Preoperative diagnosis: Left L3-4 foraminal stenosis, lumbago, lumbar radiculopathy  Postoperative diagnosis: The same  Procedure: Left L3-4 for lateral laminotomy and foraminotomy using micro-dissection  Surgeon: Dr. Earle Gell  Asst.: Dr. Granville Lewis  Anesthesia: Gen. endotracheal  Estimated blood loss: Minimal  Drains: None  Complications: None  Description of procedure: The patient was brought to the operating room by the anesthesia team. General endotracheal anesthesia was induced. The patient was turned to the prone position on the Wilson frame. The patient's lumbosacral region was then prepared with Betadine scrub and Betadine solution. Sterile drapes were applied.  I then injected the area to be incised with Marcaine with epinephrine solution. I then used a scalpel to make a linear midline incision over the L3-4 intervertebral disc space. I then used electrocautery to perform a left sided subperiosteal dissection exposing the spinous process and lamina of L3 and L4. We obtained intraoperative radiograph to confirm our location. I then inserted the [facility] retractor for exposure.  We then brought the operative microscope into the field. Under its magnification and illumination we completed the microdissection. I used a high-speed drill to drill off the lateral aspect of the left L3 pars. I then used a Kerrison punches to remove the inner transverse ligament at L3-4 on the left. The ligament was quite hypertrophied. We then used microdissection to  free up the thecal sac and the exiting left L3 nerve root from the epidural tissue. I then used a Kerrison punch to perform a foraminotomy at about the left L3 nerve root. We then dissected caudally to the nerve root and identified the far lateral bulging disc. It was quite calcified. We did not perform an intervertebral discectomy.  I then palpated along the ventral surface of the thecal sac and along exit route of the foraminal and far lateral right L3 nerve root and noted that the neural structures were well decompressed. This completed the decompression.  We then obtained hemostasis using bipolar electrocautery. We irrigated the wound out with bacitracin solution. We then removed the retractor. We then reapproximated the patient's thoracolumbar fascia with interrupted #1 Vicryl suture. We then reapproximated the patient's subcutaneous tissue with interrupted 2-0 Vicryl suture. We then reapproximated patient's skin with Steri-Strips and benzoin. The was then coated with bacitracin ointment. The drapes were removed. The patient was subsequently returned to the supine position where they were extubated by the anesthesia team. The patient was then transported to the postanesthesia care unit in stable condition. All sponge instrument and needle counts were reportedly correct at the end of this case.

## 2015-03-14 NOTE — H&P (Signed)
Subjective: The patient is an 81 year old white female who has complained of back and left leg pain consistent with a lumbar radiculopathy. She has failed medical management. She was worked up with a lumbar MRI which demonstrated a left L3-4 herniated disc. I discussed the various treatment options with the patient including surgery. She has decided to proceed with a left L3-4 discectomy.   Past Medical History  Diagnosis Date  . Diabetes mellitus without complication   . Hypertension   . GERD (gastroesophageal reflux disease)   . DDD (degenerative disc disease), lumbar   . Osteoporosis   . Allergy   . Hyperlipidemia   . History of kidney stones   . Urinary incontinence   . Acid reflux   . History of renal stone   . UTI (urinary tract infection)     currently on antibiotic  . Cystitis     Past Surgical History  Procedure Laterality Date  . Abdominal hysterectomy    . Hemorrhoid surgery    . Cataract extraction, bilateral      No Known Allergies  History  Substance Use Topics  . Smoking status: Never Smoker   . Smokeless tobacco: Not on file  . Alcohol Use: No    Family History  Problem Relation Age of Onset  . Cancer Father   . Kidney disease Neg Hx   . Prostate cancer Neg Hx   . Bladder Cancer Neg Hx    Prior to Admission medications   Medication Sig Start Date End Date Taking? Authorizing Provider  aspirin EC 81 MG tablet Take 81 mg by mouth daily.   Yes Historical Provider, MD  conjugated estrogens (PREMARIN) vaginal cream Place 1 Applicatorful vaginally daily.   Yes Historical Provider, MD  enalapril (VASOTEC) 10 MG tablet Take 10 mg by mouth daily. 11/25/14  Yes Historical Provider, MD  gabapentin (NEURONTIN) 100 MG capsule Take 100 mg by mouth 3 (three) times daily as needed (for pain).  01/21/15  Yes Historical Provider, MD  metFORMIN (GLUCOPHAGE) 500 MG tablet Take 500 mg by mouth 2 (two) times daily. 12/08/14  Yes Historical Provider, MD  metoprolol tartrate  (LOPRESSOR) 25 MG tablet Take 25 mg by mouth 2 (two) times daily. 01/19/15  Yes Historical Provider, MD  naproxen sodium (ANAPROX) 220 MG tablet Take 220 mg by mouth 2 (two) times daily as needed (pain).   Yes Historical Provider, MD  nitrofurantoin, macrocrystal-monohydrate, (MACROBID) 100 MG capsule Take 1 capsule (100 mg total) by mouth every 12 (twelve) hours. Patient taking differently: Take 100 mg by mouth every 12 (twelve) hours. Started on 7-7;  7 day course of therapy prescribed 03/03/15  Yes Shannon A McGowan, PA-C  raloxifene (EVISTA) 60 MG tablet Take 60 mg by mouth daily. 11/01/14  Yes Historical Provider, MD  simvastatin (ZOCOR) 40 MG tablet Take 40 mg by mouth at bedtime. 10/26/14  Yes Historical Provider, MD  estradiol (ESTRACE) 0.1 MG/GM vaginal cream Place 1 Applicatorful vaginally at bedtime. 02/14/15   Nori Riis, PA-C     Review of Systems  Positive ROS: As above  All other systems have been reviewed and were otherwise negative with the exception of those mentioned in the HPI and as above.  Objective: Vital signs in last 24 hours: Temp:  [97.5 F (36.4 C)] 97.5 F (36.4 C) (07/18 0750) Pulse Rate:  [72] 72 (07/18 0750) Resp:  [15] 15 (07/18 0750) BP: (166-198)/(72-78) 166/78 mmHg (07/18 0803) SpO2:  [98 %] 98 % (07/18 0750)  General Appearance: Alert, cooperative, no distress, Head: Normocephalic, without obvious abnormality, atraumatic Eyes: PERRL, conjunctiva/corneas clear, EOM's intact,    Ears: Normal  Throat: Normal  Neck: Supple, symmetrical, trachea midline, no adenopathy; thyroid: No enlargement/tenderness/nodules; no carotid bruit or JVD Back: Symmetric, no curvature, ROM normal, no CVA tenderness Lungs: Clear to auscultation bilaterally, respirations unlabored Heart: Regular rate and rhythm, no murmur, rub or gallop Abdomen: Soft, non-tender,, no masses, no organomegaly Extremities: Extremities normal, atraumatic, no cyanosis or edema Pulses: 2+ and  symmetric all extremities Skin: Skin color, texture, turgor normal, no rashes or lesions  NEUROLOGIC:   Mental status: alert and oriented, no aphasia, good attention span, Fund of knowledge/ memory ok Motor Exam - grossly normal Sensory Exam - grossly normal Reflexes:  Coordination - grossly normal Gait - grossly normal Balance - grossly normal Cranial Nerves: I: smell Not tested  II: visual acuity  OS: Normal  OD: Normal   II: visual fields Full to confrontation  II: pupils Equal, round, reactive to light  III,VII: ptosis None  III,IV,VI: extraocular muscles  Full ROM  V: mastication Normal  V: facial light touch sensation  Normal  V,VII: corneal reflex  Present  VII: facial muscle function - upper  Normal  VII: facial muscle function - lower Normal  VIII: hearing Not tested  IX: soft palate elevation  Normal  IX,X: gag reflex Present  XI: trapezius strength  5/5  XI: sternocleidomastoid strength 5/5  XI: neck flexion strength  5/5  XII: tongue strength  Normal    Data Review Lab Results  Component Value Date   WBC 5.9 03/07/2015   HGB 12.0 03/07/2015   HCT 37.0 03/07/2015   MCV 92.5 03/07/2015   PLT 177 03/07/2015   Lab Results  Component Value Date   NA 138 03/07/2015   K 3.9 03/07/2015   CL 107 03/07/2015   CO2 24 03/07/2015   BUN 10 03/07/2015   CREATININE 0.86 03/07/2015   GLUCOSE 123* 03/07/2015   No results found for: INR, PROTIME  Assessment/Plan: Left L3-4 herniated disc, lumbago, lumbar radiculopathy: I have discussed the situation with the patient. I reviewed her MRI scan with her and pointed out the abnormalities. We have discussed the various treatment options including surgery. I described the surgical treatment option of a left L3-4 discectomy. I have shown her surgical models. We have discussed the risks, benefits, alternatives, and likelihood of achieving her goals with surgery. I have answered all the patient's, and her family's, questions.  She has decided to proceed with surgery.   Briseida Gittings D 03/14/2015 9:18 AM

## 2015-03-14 NOTE — Anesthesia Postprocedure Evaluation (Signed)
  Anesthesia Post-op Note  Patient: Lynn Wall  Procedure(s) Performed: Procedure(s) with comments: LUMBAR LAMINECTOMY/DECOMPRESSION MICRODISCECTOMY 1 LEVEL (Left) - Left L34 microdiskectomy  Patient Location: PACU  Anesthesia Type:General  Level of Consciousness: awake, alert , oriented and patient cooperative  Airway and Oxygen Therapy: Patient Spontanous Breathing and Patient connected to nasal cannula oxygen  Post-op Pain: mild  Post-op Assessment: Post-op Vital signs reviewed, Patient's Cardiovascular Status Stable, Respiratory Function Stable, Patent Airway, No signs of Nausea or vomiting and Pain level controlled LLE Motor Response: Responds to commands, Purposeful movement LLE Sensation: Full sensation RLE Motor Response: Responds to commands, Purposeful movement RLE Sensation: Full sensation      Post-op Vital Signs: Reviewed and stable  Last Vitals:  Filed Vitals:   03/14/15 1256  BP: 149/66  Pulse: 64  Temp: 37.3 C  Resp: 20    Complications: No apparent anesthesia complications

## 2015-03-14 NOTE — Progress Notes (Signed)
Subjective:  The patient is somnolent but  arousable. She is in no apparent distress.  Objective: Vital signs in last 24 hours: Temp:  [97.5 F (36.4 C)] 97.5 F (36.4 C) (07/18 0750) Pulse Rate:  [72] 72 (07/18 0750) Resp:  [15] 15 (07/18 0750) BP: (166-198)/(72-78) 166/78 mmHg (07/18 0803) SpO2:  [98 %] 98 % (07/18 0750)  Intake/Output from previous day:   Intake/Output this shift: Total I/O In: 1100 [I.V.:1100] Out: 50 [Blood:50]  Physical exam the patient is arousable. She is moving all 4 extremities.  Lab Results: No results for input(s): WBC, HGB, HCT, PLT in the last 72 hours. BMET No results for input(s): NA, K, CL, CO2, GLUCOSE, BUN, CREATININE, CALCIUM in the last 72 hours.  Studies/Results: No results found.  Assessment/Plan: The patient is doing well.      Lynn Wall D 03/14/2015, 11:30 AM

## 2015-03-15 ENCOUNTER — Encounter (HOSPITAL_COMMUNITY): Payer: Self-pay | Admitting: Neurosurgery

## 2015-03-15 DIAGNOSIS — K219 Gastro-esophageal reflux disease without esophagitis: Secondary | ICD-10-CM | POA: Diagnosis not present

## 2015-03-15 DIAGNOSIS — E785 Hyperlipidemia, unspecified: Secondary | ICD-10-CM | POA: Diagnosis not present

## 2015-03-15 DIAGNOSIS — I10 Essential (primary) hypertension: Secondary | ICD-10-CM | POA: Diagnosis not present

## 2015-03-15 DIAGNOSIS — M4806 Spinal stenosis, lumbar region: Secondary | ICD-10-CM | POA: Diagnosis not present

## 2015-03-15 DIAGNOSIS — M5116 Intervertebral disc disorders with radiculopathy, lumbar region: Secondary | ICD-10-CM | POA: Diagnosis not present

## 2015-03-15 DIAGNOSIS — E119 Type 2 diabetes mellitus without complications: Secondary | ICD-10-CM | POA: Diagnosis not present

## 2015-03-15 LAB — GLUCOSE, CAPILLARY
GLUCOSE-CAPILLARY: 99 mg/dL (ref 65–99)
Glucose-Capillary: 116 mg/dL — ABNORMAL HIGH (ref 65–99)

## 2015-03-15 MED ORDER — DOCUSATE SODIUM 100 MG PO CAPS: 100.0000 mg | ORAL_CAPSULE | Freq: Two times a day (BID) | ORAL | Status: DC

## 2015-03-15 MED ORDER — OXYCODONE-ACETAMINOPHEN 5-325 MG PO TABS: 1.0000 | ORAL_TABLET | ORAL | Status: DC | PRN

## 2015-03-15 MED ORDER — CYCLOBENZAPRINE HCL 10 MG PO TABS: 10.0000 mg | ORAL_TABLET | Freq: Three times a day (TID) | ORAL | Status: DC | PRN

## 2015-03-15 NOTE — Discharge Summary (Signed)
  Physician Discharge Summary  Patient ID: PAXTON KANAAN MRN: [id number] DOB/AGE: July 08, 1927 81 y.o.  Admit date: 03/14/2015 Discharge date: 03/15/2015  Admission Diagnoses: Left L3-4 far lateral stenosis, lumbago, lumbar radiculopathy  Discharge Diagnoses: The same Active Problems:   Lumbar foraminal stenosis   Discharged Condition: good  Hospital Course: I performed a left L3-4 laminotomy and foraminotomy on the patient on 03/14/2015. The surgery went well. The patient's postoperative course was remarkable for some urinary retention. She had to be catheterized. Her urinalysis turned out okay. She has a history of bladder issues and sees a urologist in [place].  The patient would like to go home. Her leg pain is gone. The plan is to discontinue her Foley catheter later today. If she can urinates will be discharged. She was given oral and written discharge instructions. All her questions were answered.  Consults: PT Significant Diagnostic Studies: Urinalysis Treatments: Left L3-4 far lateral laminotomy and foraminotomy using microdissection Discharge Exam: Blood pressure 105/54, pulse 60, temperature 98.2 F (36.8 C), temperature source Oral, resp. rate 18, SpO2 95 %. The patient is alert and pleasant. Her strength is normal. Her dressing is clean and dry.  Disposition: Home     Medication List    TAKE these medications        aspirin EC 81 MG tablet  Take 81 mg by mouth daily.     conjugated estrogens vaginal cream  Commonly known as:  PREMARIN  Place 1 Applicatorful vaginally daily.     cyclobenzaprine 10 MG tablet  Commonly known as:  FLEXERIL  Take 1 tablet (10 mg total) by mouth 3 (three) times daily as needed for muscle spasms.     docusate sodium 100 MG capsule  Commonly known as:  COLACE  Take 1 capsule (100 mg total) by mouth 2 (two) times daily.     enalapril 10 MG tablet  Commonly known as:  VASOTEC  Take 10 mg by mouth daily.     gabapentin 100  MG capsule  Commonly known as:  NEURONTIN  Take 100 mg by mouth 3 (three) times daily as needed (for pain).     metFORMIN 500 MG tablet  Commonly known as:  GLUCOPHAGE  Take 500 mg by mouth 2 (two) times daily.     metoprolol tartrate 25 MG tablet  Commonly known as:  LOPRESSOR  Take 25 mg by mouth 2 (two) times daily.     naproxen sodium 220 MG tablet  Commonly known as:  ANAPROX  Take 220 mg by mouth 2 (two) times daily as needed (pain).     oxyCODONE-acetaminophen 5-325 MG per tablet  Commonly known as:  PERCOCET/ROXICET  Take 1-2 tablets by mouth every 4 (four) hours as needed for moderate pain.     raloxifene 60 MG tablet  Commonly known as:  EVISTA  Take 60 mg by mouth daily.     simvastatin 40 MG tablet  Commonly known as:  ZOCOR  Take 40 mg by mouth at bedtime.      ASK your doctor about these medications        estradiol 0.1 MG/GM vaginal cream  Commonly known as:  ESTRACE  Place 1 Applicatorful vaginally at bedtime.     nitrofurantoin (macrocrystal-monohydrate) 100 MG capsule  Commonly known as:  MACROBID  Take 1 capsule (100 mg total) by mouth every 12 (twelve) hours.         SignedOphelia Charter 03/15/2015, 7:44 AM

## 2015-03-15 NOTE — Progress Notes (Signed)
Patient alert and oriented, mae's well, voiding adequate amount of urine, swallowing without difficulty, no c/o pain. Patient discharged home with family. Script and discharged instructions given to patient. Patient and family stated understanding of d/c instructions given and has an appointment with MD. 

## 2015-03-15 NOTE — Progress Notes (Signed)
Physical Therapy Treatment Patient Details Name: Lynn Wall MRN: [id number] DOB: 1927-08-26 Today's Date: 03/15/2015    History of Present Illness Patient is a 81 y/o female s/p L3-4 decompression/fusion and microdiskectomy. PMH includes DM, HTN, HLD, UTI.    PT Comments    Pt is POD #2 and is moving well with very mild signs of imbalance during gait.  She was able to demonstrate the ability to do stairs simulating home entry with minimal assistance and we learned that she will have to lead up the stairs with her right stronger leg as her left leg is not strong enough to support her without straining.  PT will continue to follow acutely until d/c to progress mobility, independence and reinforce back precautions.   Follow Up Recommendations  Supervision for mobility/OOB     Equipment Recommendations  None recommended by PT    Recommendations for Other Services   NA     Precautions / Restrictions Precautions Precautions: Back Precaution Booklet Issued: Yes (comment) Precaution Comments: verbally reviewed precautions, lifting restrictions, walking, posture, and to avoid sitting for >30-45 mins at a time.  Restrictions Weight Bearing Restrictions: No    Mobility  Bed Mobility Overal bed mobility: Needs Assistance Bed Mobility: Rolling;Sidelying to Sit Rolling: Modified independent (Device/Increase time) Sidelying to sit: Supervision       General bed mobility comments: Verbal cues for correct log roll technique.  Reliance on railing for support.   Transfers Overall transfer level: Needs assistance Equipment used: None Transfers: Sit to/from Stand Sit to Stand: Min guard         General transfer comment: Min guard assist due to initial LOB posteriorly in standing.   Ambulation/Gait Ambulation/Gait assistance: Min guard Ambulation Distance (Feet): 200 Feet Assistive device: None Gait Pattern/deviations: Step-through pattern Gait velocity: decreased Gait  velocity interpretation: <1.8 ft/sec, indicative of risk for recurrent falls General Gait Details: slow, guargded gait pattern.  At times reaching for outside support on the wall or hallway railing.    Stairs Stairs: Yes Stairs assistance: Min assist Stair Management: One rail Right;Step to pattern;Forwards Number of Stairs: 5 General stair comments: Step to pattern encouraged for safety with stronger leg leading up and weaker leg leading down.  Pt was unable to lead up with her left leg (she wanted to try to see if it was better).          Balance Overall balance assessment: Needs assistance Sitting-balance support: Feet supported;No upper extremity supported Sitting balance-Leahy Scale: Good     Standing balance support: No upper extremity supported;Bilateral upper extremity supported;Single extremity supported Standing balance-Leahy Scale: Fair                      Cognition Arousal/Alertness: Awake/alert Behavior During Therapy: WFL for tasks assessed/performed Overall Cognitive Status: Within Functional Limits for tasks assessed                             Pertinent Vitals/Pain Pain Assessment: 0-10 Pain Score: 4  Pain Location: low back Pain Descriptors / Indicators: Aching;Grimacing Pain Intervention(s): Limited activity within patient's tolerance;Monitored during session;Repositioned           PT Goals (current goals can now be found in the care plan section) Acute Rehab PT Goals Patient Stated Goal: to be pain free Progress towards PT goals: Progressing toward goals    Frequency  Min 5X/week    PT Plan Current plan remains  appropriate    Co-evaluation             End of Session   Activity Tolerance: Patient limited by fatigue;Patient limited by pain Patient left: in chair;with call bell/phone within reach     Time: 0156-1537 PT Time Calculation (min) (ACUTE ONLY): 16 min  Charges:  $Gait Training: 8-22 mins                       Seila Liston B. Issaiah Seabrooks, PT, DPT [phone]   03/15/2015, 11:52 AM

## 2015-03-15 NOTE — Progress Notes (Signed)
Patient voided 200 ml clear yellow urine without difficulty. Bladder scan result of 53 ml noted. Patient has no c/o pain or discomfort around lower abdominal area. Will continue to monitor patient.

## 2015-03-16 LAB — URINE CULTURE: Culture: NO GROWTH

## 2015-03-28 ENCOUNTER — Encounter: Payer: Self-pay | Admitting: Urology

## 2015-03-28 ENCOUNTER — Ambulatory Visit (INDEPENDENT_AMBULATORY_CARE_PROVIDER_SITE_OTHER): Payer: Medicare Other | Admitting: Urology

## 2015-03-28 VITALS — BP 172/83 | HR 72 | Ht 62.0 in | Wt 140.6 lb

## 2015-03-28 DIAGNOSIS — R338 Other retention of urine: Secondary | ICD-10-CM | POA: Insufficient documentation

## 2015-03-28 DIAGNOSIS — N952 Postmenopausal atrophic vaginitis: Secondary | ICD-10-CM

## 2015-03-28 DIAGNOSIS — Z87448 Personal history of other diseases of urinary system: Secondary | ICD-10-CM | POA: Diagnosis not present

## 2015-03-28 DIAGNOSIS — Z87898 Personal history of other specified conditions: Secondary | ICD-10-CM

## 2015-03-28 DIAGNOSIS — N390 Urinary tract infection, site not specified: Secondary | ICD-10-CM | POA: Diagnosis not present

## 2015-03-28 LAB — URINALYSIS, COMPLETE
Bilirubin, UA: NEGATIVE
GLUCOSE, UA: NEGATIVE
Ketones, UA: NEGATIVE
NITRITE UA: NEGATIVE
PH UA: 6 (ref 5.0–7.5)
PROTEIN UA: NEGATIVE
RBC, UA: NEGATIVE
Specific Gravity, UA: 1.015 (ref 1.005–1.030)
Urobilinogen, Ur: 0.2 mg/dL (ref 0.2–1.0)

## 2015-03-28 LAB — BLADDER SCAN AMB NON-IMAGING: SCAN RESULT: 0

## 2015-03-28 LAB — MICROSCOPIC EXAMINATION
Epithelial Cells (non renal): >10 /hpf — AB (ref 0–10)
RBC, UA: NONE SEEN /hpf (ref 0–?)

## 2015-03-28 MED ORDER — ESTROGENS, CONJUGATED 0.625 MG/GM VA CREA: 1.0000 | TOPICAL_CREAM | Freq: Every day | VAGINAL | Status: DC

## 2015-03-28 NOTE — Progress Notes (Signed)
03/28/2015 10:20 AM   Lynn Wall 1926/08/29 [id number]  Referring provider: Jerrol Banana., MD [address] [place], [place] [address]  Chief Complaint  Patient presents with  . Cystitis    one month follow up  . Vaginitis    HPI: Lynn Wall is an 81 year old white female who presents today for follow up after she underwent lumber laminectomy and decompression and microdiscectomy on L1 by Dr. Arnoldo Morale in [place] on 03/14/2015.  She has some post operative urinary retention and had a foley catheter in place for two days.  She is urinating well at this time.  She did have some vaginal burning last week and started OTC Monistat.  The vaginal burning has cleared.    She is still using her vaginal estrogen cream three nights weekly.    We had originally seen her persistent dysuria.  She had negative cultures with her PCP and one positive UCx with [place].    She is not experiencing any gross hematuria, suprapubic pain, fevers, chills, nausea or vomiting. Her PVR today is minimal and her UA is unremarkable.   PMH: Past Medical History  Diagnosis Date  . Diabetes mellitus without complication   . Hypertension   . GERD (gastroesophageal reflux disease)   . DDD (degenerative disc disease), lumbar   . Osteoporosis   . Allergy   . Hyperlipidemia   . History of kidney stones   . Urinary incontinence   . Acid reflux   . History of renal stone   . UTI (urinary tract infection)     currently on antibiotic  . Cystitis     Surgical History: Past Surgical History  Procedure Laterality Date  . Abdominal hysterectomy    . Hemorrhoid surgery    . Cataract extraction, bilateral    . Lumbar laminectomy/decompression microdiscectomy Left 03/14/2015    Procedure: LUMBAR LAMINECTOMY/DECOMPRESSION MICRODISCECTOMY 1 LEVEL;  Surgeon: Newman Pies, MD;  Location: [facility];  Service: Neurosurgery;  Laterality: Left;  Left L34 microdiskectomy    Home  Medications:    Medication List       This list is accurate as of: 03/28/15 10:20 AM.  Always use your most recent med list.               aspirin EC 81 MG tablet  Take 81 mg by mouth daily.     conjugated estrogens vaginal cream  Commonly known as:  PREMARIN  Place 1 Applicatorful vaginally daily.     cyclobenzaprine 10 MG tablet  Commonly known as:  FLEXERIL  Take 1 tablet (10 mg total) by mouth 3 (three) times daily as needed for muscle spasms.     docusate sodium 100 MG capsule  Commonly known as:  COLACE  Take 1 capsule (100 mg total) by mouth 2 (two) times daily.     enalapril 10 MG tablet  Commonly known as:  VASOTEC  Take 10 mg by mouth daily.     estradiol 0.1 MG/GM vaginal cream  Commonly known as:  ESTRACE  Place 1 Applicatorful vaginally at bedtime.     gabapentin 100 MG capsule  Commonly known as:  NEURONTIN  Take 100 mg by mouth 3 (three) times daily as needed (for pain).     metFORMIN 500 MG tablet  Commonly known as:  GLUCOPHAGE  Take 500 mg by mouth 2 (two) times daily.     metoprolol tartrate 25 MG tablet  Commonly known as:  LOPRESSOR  Take 25 mg by mouth 2 (two) times daily.     MONISTAT 1 COMBO PACK VA  Place vaginally.     naproxen sodium 220 MG tablet  Commonly known as:  ANAPROX  Take 220 mg by mouth 2 (two) times daily as needed (pain).     oxyCODONE-acetaminophen 5-325 MG per tablet  Commonly known as:  PERCOCET/ROXICET  Take 1-2 tablets by mouth every 4 (four) hours as needed for moderate pain.     raloxifene 60 MG tablet  Commonly known as:  EVISTA  Take 60 mg by mouth daily.     simvastatin 40 MG tablet  Commonly known as:  ZOCOR  Take 40 mg by mouth at bedtime.        Allergies: No Known Allergies  Family History: Family History  Problem Relation Age of Onset  . Cancer Father   . Kidney disease Neg Hx   . Prostate cancer Neg Hx   . Bladder Cancer Neg Hx     Social History:  reports that she has never smoked.  She does not have any smokeless tobacco history on file. She reports that she does not drink alcohol or use illicit drugs.  ROS: UROLOGY Frequent Urination?: No Hard to postpone urination?: Yes Burning/pain with urination?: No Get up at night to urinate?: Yes Leakage of urine?: Yes Urine stream starts and stops?: No Trouble starting stream?: No Do you have to strain to urinate?: No Blood in urine?: No Urinary tract infection?: No Sexually transmitted disease?: No Injury to kidneys or bladder?: No Painful intercourse?: No Weak stream?: No Currently pregnant?: No Vaginal bleeding?: No Last menstrual period?: n  Gastrointestinal Nausea?: No Vomiting?: No Indigestion/heartburn?: No Diarrhea?: No Constipation?: No  Constitutional Fever: No Night sweats?: No Weight loss?: No Fatigue?: No  Skin Skin rash/lesions?: No Itching?: No  Eyes Blurred vision?: No Double vision?: No  Ears/Nose/Throat Sore throat?: No Sinus problems?: No  Hematologic/Lymphatic Swollen glands?: No Easy bruising?: No  Cardiovascular Leg swelling?: No Chest pain?: No  Respiratory Cough?: No Shortness of breath?: No  Endocrine Excessive thirst?: No  Musculoskeletal Back pain?: Yes Joint pain?: No  Neurological Headaches?: No Dizziness?: No  Psychologic Depression?: No Anxiety?: No  Physical Exam: BP 172/83 mmHg  Pulse 72  Ht 5\' 2"  (1.575 m)  Wt 140 lb 9.6 oz (63.776 kg)  BMI 25.71 kg/m2  LMP  (LMP Unknown)  GU:  Atrophic external genitalia.  Normal urethral meatus. No urethral masses and/or tenderness. No bladder fullness or masses. No vaginal lesions or discharge. Normal rectal tone, no masses. Normal anus and perineum.   Laboratory Data: Results for orders placed or performed in visit on 03/28/15  Microscopic Examination  Result Value Ref Range   WBC, UA 0-5 0 -  5 /hpf   RBC, UA None seen 0 -  2 /hpf   Epithelial Cells (non renal) >10 (A) 0 - 10 /hpf   Bacteria,  UA Few None seen/Few  Urinalysis, Complete  Result Value Ref Range   Specific Gravity, UA 1.015 1.005 - 1.030   pH, UA 6.0 5.0 - 7.5   Color, UA Yellow Yellow   Appearance Ur Clear Clear   Leukocytes, UA Trace (A) Negative   Protein, UA Negative Negative/Trace   Glucose, UA Negative Negative   Ketones, UA Negative Negative   RBC, UA Negative Negative   Bilirubin, UA Negative Negative   Urobilinogen, Ur 0.2 0.2 - 1.0 mg/dL   Nitrite, UA Negative Negative   Microscopic  Examination See below:   BLADDER SCAN AMB NON-IMAGING  Result Value Ref Range   Scan Result 0     Lab Results  Component Value Date   WBC 5.9 03/07/2015   HGB 12.0 03/07/2015   HCT 37.0 03/07/2015   MCV 92.5 03/07/2015   PLT 177 03/07/2015    Lab Results  Component Value Date   CREATININE 0.86 03/07/2015   Lab Results  Component Value Date   HGBA1C 6.7* 03/07/2015    Pertinent Imaging:   Assessment & Plan:    1. Post operative retention:   Her PVR today is minimal and she is urinating without difficulty.  This issue has resolved for the moment.  She will return in three months and we will check a PVR at that time to ensure her bladder is still emptying.  - Urinalysis, Complete  2. UTI:   Patient's UA is unremarkable today.  She is not having any urinary symptoms at this time.  We will continue to monitor.  She is to return to the clinic in 3 months and we will check an UA at that time.  She is to contact us if she should experience any symptoms of an UTI prior to her return visit.    - BLADDER SCAN AMB NON-IMAGING  3. Atrophic vaginitis: Patient is given samples of Premarin cream.  The Estrace cream was very irritating to her vaginal mucosa.  I did not have samples available for her at this time.  I have escribed a prescription for the Premarin cream to her pharmacy. Hopefully, she will not find that cost prohibitive. She is to contact the office in a few days to see if samples are available. We will  reexamine her vaginal mucosa in 3 months.    No Follow-up on file.  Zara Council, [facility] [address], [place], [place] [address] [phone]

## 2015-05-23 ENCOUNTER — Other Ambulatory Visit: Payer: Self-pay | Admitting: Family Medicine

## 2015-05-23 ENCOUNTER — Encounter: Payer: Self-pay | Admitting: Family Medicine

## 2015-05-23 ENCOUNTER — Other Ambulatory Visit: Payer: Self-pay

## 2015-05-23 ENCOUNTER — Ambulatory Visit (INDEPENDENT_AMBULATORY_CARE_PROVIDER_SITE_OTHER): Payer: Medicare Other | Admitting: Family Medicine

## 2015-05-23 VITALS — BP 148/80 | HR 69 | Temp 97.7°F | Resp 16 | Wt 141.4 lb

## 2015-05-23 DIAGNOSIS — M51369 Other intervertebral disc degeneration, lumbar region without mention of lumbar back pain or lower extremity pain: Secondary | ICD-10-CM | POA: Insufficient documentation

## 2015-05-23 DIAGNOSIS — M545 Low back pain, unspecified: Secondary | ICD-10-CM | POA: Insufficient documentation

## 2015-05-23 DIAGNOSIS — L821 Other seborrheic keratosis: Secondary | ICD-10-CM | POA: Insufficient documentation

## 2015-05-23 DIAGNOSIS — M5136 Other intervertebral disc degeneration, lumbar region: Secondary | ICD-10-CM | POA: Insufficient documentation

## 2015-05-23 DIAGNOSIS — H35369 Drusen (degenerative) of macula, unspecified eye: Secondary | ICD-10-CM | POA: Insufficient documentation

## 2015-05-23 DIAGNOSIS — Z23 Encounter for immunization: Secondary | ICD-10-CM

## 2015-05-23 DIAGNOSIS — M5430 Sciatica, unspecified side: Secondary | ICD-10-CM | POA: Insufficient documentation

## 2015-05-23 DIAGNOSIS — R5383 Other fatigue: Secondary | ICD-10-CM | POA: Insufficient documentation

## 2015-05-23 DIAGNOSIS — E119 Type 2 diabetes mellitus without complications: Secondary | ICD-10-CM | POA: Insufficient documentation

## 2015-05-23 DIAGNOSIS — G4700 Insomnia, unspecified: Secondary | ICD-10-CM | POA: Insufficient documentation

## 2015-05-23 DIAGNOSIS — K219 Gastro-esophageal reflux disease without esophagitis: Secondary | ICD-10-CM | POA: Insufficient documentation

## 2015-05-23 DIAGNOSIS — K5900 Constipation, unspecified: Secondary | ICD-10-CM | POA: Insufficient documentation

## 2015-05-23 MED ORDER — GABAPENTIN 100 MG PO CAPS: ORAL_CAPSULE | ORAL | Status: DC

## 2015-05-23 NOTE — Progress Notes (Signed)
Patient ID: ILENA DIECKMAN, female   DOB: 04-26-1927, 81 y.o.   MRN: [id number]    Subjective:  Diabetes She presents for her follow-up diabetic visit. She has type 2 diabetes mellitus. Her disease course has been stable. Symptoms are stable.  Hypertension The problem is controlled.  Back Pain This is a recurrent problem. The pain is the same all the time. Treatments tried: back surgery in July 2016. The treatment provided no relief.     Prior to Admission medications   Medication Sig Start Date End Date Taking? Authorizing Provider  aspirin EC 81 MG tablet Take 81 mg by mouth daily.   Yes Historical Provider, MD  conjugated estrogens (PREMARIN) vaginal cream Place 1 Applicatorful vaginally daily. Patient was given a sample of vaginal estrogen cream and instructed to apply 0.5mg  (pea-sized amount)  just inside the vaginal introitus with a finger-tip every night for two weeks and then Monday, Wednesday and Friday nights. 03/28/15  Yes Shannon A McGowan, PA-C  cyclobenzaprine (FLEXERIL) 10 MG tablet Take 1 tablet (10 mg total) by mouth 3 (three) times daily as needed for muscle spasms. 03/15/15  Yes Newman Pies, MD  docusate sodium (COLACE) 100 MG capsule Take 1 capsule (100 mg total) by mouth 2 (two) times daily. 03/15/15  Yes Newman Pies, MD  enalapril (VASOTEC) 10 MG tablet Take 10 mg by mouth daily. 11/25/14  Yes Historical Provider, MD  estradiol (ESTRACE) 0.1 MG/GM vaginal cream Place 1 Applicatorful vaginally at bedtime. 02/14/15  Yes Shannon A McGowan, PA-C  gabapentin (NEURONTIN) 100 MG capsule Take 100 mg by mouth 3 (three) times daily as needed (for pain).  01/21/15  Yes Historical Provider, MD  metFORMIN (GLUCOPHAGE) 500 MG tablet Take 500 mg by mouth 2 (two) times daily. 12/08/14  Yes Historical Provider, MD  metoprolol tartrate (LOPRESSOR) 25 MG tablet Take 25 mg by mouth 2 (two) times daily. 01/19/15  Yes Historical Provider, MD  Miconazole Nitrate (MONISTAT 1 COMBO PACK VA)  Place vaginally.   Yes Historical Provider, MD  naproxen sodium (ANAPROX) 220 MG tablet Take 220 mg by mouth 2 (two) times daily as needed (pain).   Yes Historical Provider, MD  omeprazole (PRILOSEC) 20 MG capsule Take by mouth. 09/10/11  Yes Historical Provider, MD  oxyCODONE-acetaminophen (PERCOCET/ROXICET) 5-325 MG per tablet Take 1-2 tablets by mouth every 4 (four) hours as needed for moderate pain. 03/15/15  Yes Newman Pies, MD  raloxifene (EVISTA) 60 MG tablet Take 60 mg by mouth daily. 11/01/14  Yes Historical Provider, MD  simvastatin (ZOCOR) 40 MG tablet Take 40 mg by mouth at bedtime. 10/26/14  Yes Historical Provider, MD  traMADol (ULTRAM) 50 MG tablet Take by mouth. 09/28/14  Yes Historical Provider, MD    Patient Active Problem List   Diagnosis Date Noted  . CN (constipation) 05/23/2015  . DDD (degenerative disc disease), lumbar 05/23/2015  . Diabetes mellitus type 2 without retinopathy 05/23/2015  . Fatigue 05/23/2015  . Acid reflux 05/23/2015  . LBP (low back pain) 05/23/2015  . Drusen of macula 05/23/2015  . Neuralgia neuritis, sciatic nerve 05/23/2015  . Basal cell papilloma 05/23/2015  . Acute urinary retention 03/28/2015  . Lumbar foraminal stenosis 03/14/2015  . Chronic cystitis 03/03/2015  . Cystocele, grade 2 02/18/2015  . History of kidney stones 02/18/2015  . Atrophic vaginitis 02/15/2015  . Recurrent UTI 02/15/2015  . Displacement of lumbar intervertebral disc without myelopathy 12/01/2014  . Neuritis or radiculitis due to rupture of lumbar intervertebral disc 11/10/2014  .  Internal hemorrhoids without complication 16/05/9603  . Flutter-fibrillation 03/22/2008  . Paroxysmal supraventricular tachycardia 03/22/2008  . Colon, diverticulosis 10/28/2007  . History of colon polyps 10/28/2007  . Malignant neoplasm of skin of parts of face 10/28/2007  . Calculus of kidney 10/18/2007  . Benign essential HTN 10/18/2007  . OP (osteoporosis) 10/18/2007  . Diabetes  10/16/2007  . Hypercholesteremia 10/16/2007    Past Medical History  Diagnosis Date  . Diabetes mellitus without complication   . Hypertension   . GERD (gastroesophageal reflux disease)   . DDD (degenerative disc disease), lumbar   . Osteoporosis   . Allergy   . Hyperlipidemia   . History of kidney stones   . Urinary incontinence   . Acid reflux   . History of renal stone   . UTI (urinary tract infection)     currently on antibiotic  . Cystitis     Social History   Social History  . Marital Status: Married    Spouse Name: N/A  . Number of Children: N/A  . Years of Education: N/A   Occupational History  . Not on file.   Social History Main Topics  . Smoking status: Never Smoker   . Smokeless tobacco: Not on file  . Alcohol Use: No  . Drug Use: No  . Sexual Activity: Not Currently   Other Topics Concern  . Not on file   Social History Narrative    No Known Allergies  Review of Systems  Constitutional: Negative.   HENT: Negative.   Eyes: Negative.   Cardiovascular: Negative.   Gastrointestinal: Negative.   Genitourinary: Negative.   Musculoskeletal: Positive for back pain.  Skin: Negative.   Neurological: Negative.   Endo/Heme/Allergies: Negative.   Psychiatric/Behavioral: Negative.      There is no immunization history on file for this patient. Objective:  BP 148/80 mmHg  Pulse 69  Temp(Src) 97.7 F (36.5 C) (Oral)  Resp 16  Wt 141 lb 6.4 oz (64.139 kg)  SpO2 96%  LMP  (LMP Unknown)  Physical Exam  Constitutional: She is oriented to person, place, and time and well-developed, well-nourished, and in no distress.  HENT:  Head: Normocephalic and atraumatic.  Right Ear: External ear normal.  Left Ear: External ear normal.  Nose: Nose normal.  Eyes: Conjunctivae are normal.  Neck: Neck supple.  Cardiovascular: Normal rate, regular rhythm and normal heart sounds.   Pulmonary/Chest: Effort normal and breath sounds normal.  Abdominal: Soft.    Neurological: She is alert and oriented to person, place, and time. Gait normal.  Skin: Skin is warm and dry.  Psychiatric: Mood, memory, affect and judgment normal.    Lab Results  Component Value Date   WBC 5.9 03/07/2015   HGB 12.0 03/07/2015   HCT 37.0 03/07/2015   PLT 177 03/07/2015   GLUCOSE 123* 03/07/2015   HGBA1C 6.7* 03/07/2015    CMP     Component Value Date/Time   NA 138 03/07/2015 1226   K 3.9 03/07/2015 1226   CL 107 03/07/2015 1226   CO2 24 03/07/2015 1226   GLUCOSE 123* 03/07/2015 1226   BUN 10 03/07/2015 1226   CREATININE 0.86 03/07/2015 1226   CALCIUM 9.8 03/07/2015 1226   GFRNONAA 60* 03/07/2015 1226   GFRAA >60 03/07/2015 1226    Assessment and Plan :  1. Need for influenza vaccination  - Flu vaccine HIGH DOSE PF Chronic back pain recent surgery by Dr. Catheryn Bacon Asian where this pain can last up to a  year. Describes time double Neurontin dose from 100 mg 3 times a day to 200 mg 3 times a day. Watch blood pressure as it has risen with this pain. 2. Type 2 diabetes with early nephropathy 3. Hypertension  Miguel Aschoff MD [facility] 05/23/2015 9:59 AM

## 2015-05-24 ENCOUNTER — Other Ambulatory Visit: Payer: Self-pay | Admitting: Family Medicine

## 2015-05-24 DIAGNOSIS — E7800 Pure hypercholesterolemia, unspecified: Secondary | ICD-10-CM

## 2015-05-24 NOTE — Telephone Encounter (Signed)
Lynn Wall patient 

## 2015-06-28 ENCOUNTER — Ambulatory Visit: Payer: Medicare Other | Admitting: Urology

## 2015-07-08 DIAGNOSIS — I10 Essential (primary) hypertension: Secondary | ICD-10-CM | POA: Diagnosis not present

## 2015-07-08 DIAGNOSIS — M545 Low back pain: Secondary | ICD-10-CM | POA: Diagnosis not present

## 2015-07-08 DIAGNOSIS — Z6825 Body mass index (BMI) 25.0-25.9, adult: Secondary | ICD-10-CM | POA: Diagnosis not present

## 2015-07-12 ENCOUNTER — Other Ambulatory Visit: Payer: Self-pay | Admitting: Neurosurgery

## 2015-07-12 DIAGNOSIS — M545 Low back pain: Principal | ICD-10-CM

## 2015-07-12 DIAGNOSIS — G8929 Other chronic pain: Secondary | ICD-10-CM

## 2015-07-20 DIAGNOSIS — H3530 Unspecified macular degeneration: Secondary | ICD-10-CM | POA: Diagnosis not present

## 2015-07-28 ENCOUNTER — Ambulatory Visit
Admission: RE | Admit: 2015-07-28 | Discharge: 2015-07-28 | Disposition: A | Discharge status: Home / Self Care | Payer: Medicare Other | Source: Ambulatory Visit | Attending: Neurosurgery | Admitting: Neurosurgery

## 2015-07-28 ENCOUNTER — Other Ambulatory Visit
Admission: AD | Admit: 2015-07-28 | Discharge: 2015-07-28 | Disposition: A | Discharge status: Home / Self Care | Payer: Medicare Other | Source: Ambulatory Visit | Attending: Neurosurgery | Admitting: Neurosurgery

## 2015-07-28 DIAGNOSIS — M545 Low back pain: Secondary | ICD-10-CM | POA: Diagnosis not present

## 2015-07-28 DIAGNOSIS — M4806 Spinal stenosis, lumbar region: Secondary | ICD-10-CM | POA: Diagnosis not present

## 2015-07-28 DIAGNOSIS — G8929 Other chronic pain: Secondary | ICD-10-CM

## 2015-07-28 DIAGNOSIS — M549 Dorsalgia, unspecified: Secondary | ICD-10-CM | POA: Diagnosis not present

## 2015-07-28 LAB — CREATININE, SERUM
CREATININE: 0.88 mg/dL (ref 0.44–1.00)
GFR calc Af Amer: >60 mL/min (ref >60)
GFR calc non Af Amer: 58 mL/min — ABNORMAL LOW (ref >60)

## 2015-07-28 MED ORDER — GADOBENATE DIMEGLUMINE 529 MG/ML IV SOLN
15.0000 mL | Freq: Once | INTRAVENOUS | Status: AC | PRN
  Administered 2015-07-28: 13 mL via INTRAVENOUS

## 2015-08-18 ENCOUNTER — Encounter: Payer: Self-pay | Admitting: Family Medicine

## 2015-10-31 ENCOUNTER — Other Ambulatory Visit: Payer: Self-pay | Admitting: Family Medicine

## 2015-10-31 ENCOUNTER — Ambulatory Visit: Payer: Medicare Other | Admitting: Family Medicine

## 2015-11-07 ENCOUNTER — Ambulatory Visit
Admission: RE | Admit: 2015-11-07 | Discharge: 2015-11-07 | Disposition: A | Discharge status: Home / Self Care | Payer: Medicare Other | Source: Ambulatory Visit | Attending: Family Medicine | Admitting: Family Medicine

## 2015-11-07 ENCOUNTER — Ambulatory Visit (INDEPENDENT_AMBULATORY_CARE_PROVIDER_SITE_OTHER): Payer: Medicare Other | Admitting: Family Medicine

## 2015-11-07 ENCOUNTER — Encounter: Payer: Self-pay | Admitting: Family Medicine

## 2015-11-07 VITALS — BP 156/72 | HR 66 | Temp 98.1°F | Resp 12 | Wt 144.0 lb

## 2015-11-07 DIAGNOSIS — E119 Type 2 diabetes mellitus without complications: Secondary | ICD-10-CM

## 2015-11-07 DIAGNOSIS — M79601 Pain in right arm: Secondary | ICD-10-CM

## 2015-11-07 DIAGNOSIS — M545 Low back pain: Secondary | ICD-10-CM | POA: Diagnosis not present

## 2015-11-07 DIAGNOSIS — I10 Essential (primary) hypertension: Secondary | ICD-10-CM | POA: Diagnosis not present

## 2015-11-07 DIAGNOSIS — M5136 Other intervertebral disc degeneration, lumbar region: Secondary | ICD-10-CM

## 2015-11-07 DIAGNOSIS — M51369 Other intervertebral disc degeneration, lumbar region without mention of lumbar back pain or lower extremity pain: Secondary | ICD-10-CM

## 2015-11-07 DIAGNOSIS — M79621 Pain in right upper arm: Secondary | ICD-10-CM | POA: Diagnosis not present

## 2015-11-07 DIAGNOSIS — M19011 Primary osteoarthritis, right shoulder: Secondary | ICD-10-CM | POA: Diagnosis not present

## 2015-11-07 LAB — POCT GLYCOSYLATED HEMOGLOBIN (HGB A1C): HEMOGLOBIN A1C: 6.7

## 2015-11-07 MED ORDER — MELOXICAM 7.5 MG PO TABS: 7.5000 mg | ORAL_TABLET | Freq: Every day | ORAL | Status: DC

## 2015-11-07 NOTE — Patient Instructions (Signed)
Use heating pad for arm pain.

## 2015-11-07 NOTE — Progress Notes (Signed)
Patient ID: Lynn Wall, female   DOB: 10/31/1926, 82 y.o.   MRN: [id number]    Subjective:  HPI  Diabetes Mellitus Type II, Follow-up:   Lab Results  Component Value Date   HGBA1C 6.7* 03/07/2015    Last seen for diabetes 6 months ago.  Management since then includes none She reports good compliance with treatment. She is not having side effects.  Current symptoms include none. Home blood sugar records: around 119 most days  Episodes of hypoglycemia? no   Current Insulin Regimen: n/a Most Recent Eye Exam: within a year Current exercise: none  Pertinent Labs:    Component Value Date/Time   CREATININE 0.88 07/28/2015 1115    Wt Readings from Last 3 Encounters:  11/07/15 144 lb (65.318 kg)  05/23/15 141 lb 6.4 oz (64.139 kg)  03/28/15 140 lb 9.6 oz (63.776 kg)    ------------------------------------------------------------------------   Hypertension, follow-up:  BP Readings from Last 3 Encounters:  11/07/15 156/72  05/23/15 148/80  03/28/15 172/83    She was last seen for hypertension 6 months ago.  BP at that visit was 148/60 Management since that visit includes none She reports good compliance with treatment. She is not having side effects.  She is not exercising. She is adherent to low salt diet.   Outside blood pressures are not being checked She is experiencing none.  Patient denies chest pain, chest pressure/discomfort, claudication, dyspnea, exertional chest pressure/discomfort, fatigue, irregular heart beat, lower extremity edema, near-syncope, palpitations and paroxysmal nocturnal dyspnea.   Cardiovascular risk factors include advanced age (older than 80 for men, 74 for women), diabetes mellitus, dyslipidemia and hypertension.   Wt Readings from Last 3 Encounters:  11/07/15 144 lb (65.318 kg)  05/23/15 141 lb 6.4 oz (64.139 kg)  03/28/15 140 lb 9.6 oz (63.776 kg)    ------------------------------------------------------------------------  Pt has been having right arm pain since the summer time. She has a hard time raising her arm half way up. She says that it is not her shoulder or elbow it is her upper arm. Heating pad helps but comes back quickly after.     Prior to Admission medications   Medication Sig Start Date End Date Taking? Authorizing Provider  aspirin EC 81 MG tablet Take 81 mg by mouth daily.   Yes Historical Provider, MD  conjugated estrogens (PREMARIN) vaginal cream Place 1 Applicatorful vaginally daily. Patient was given a sample of vaginal estrogen cream and instructed to apply 0.5mg  (pea-sized amount)  just inside the vaginal introitus with a finger-tip every night for two weeks and then Monday, Wednesday and Friday nights. 03/28/15  Yes Shannon A McGowan, PA-C  cyclobenzaprine (FLEXERIL) 10 MG tablet Take 1 tablet (10 mg total) by mouth 3 (three) times daily as needed for muscle spasms. 03/15/15  Yes Newman Pies, MD  docusate sodium (COLACE) 100 MG capsule Take 1 capsule (100 mg total) by mouth 2 (two) times daily. 03/15/15  Yes Newman Pies, MD  enalapril (VASOTEC) 10 MG tablet 1 TABLET, ORAL, DAILY FOR BLOOD PRESSURE 05/23/15  Yes Jerrol Banana., MD  estradiol (ESTRACE) 0.1 MG/GM vaginal cream Place 1 Applicatorful vaginally at bedtime. 02/14/15  Yes Nori Riis, PA-C  gabapentin (NEURONTIN) 100 MG capsule 2 tablets three times daily 05/23/15  Yes Richard Maceo Pro., MD  metFORMIN (GLUCOPHAGE) 500 MG tablet Take 500 mg by mouth 2 (two) times daily. 12/08/14  Yes Historical Provider, MD  metoprolol tartrate (LOPRESSOR) 25 MG tablet TAKE 1 TABLET TWICE A  DAY 10/31/15  Yes Richard Maceo Pro., MD  Miconazole Nitrate (MONISTAT 1 COMBO PACK VA) Place vaginally.   Yes Historical Provider, MD  naproxen sodium (ANAPROX) 220 MG tablet Take 220 mg by mouth 2 (two) times daily as needed (pain).   Yes Historical Provider, MD   omeprazole (PRILOSEC) 20 MG capsule Take by mouth. 09/10/11  Yes Historical Provider, MD  raloxifene (EVISTA) 60 MG tablet Take 60 mg by mouth daily. 11/01/14  Yes Historical Provider, MD  simvastatin (ZOCOR) 40 MG tablet TAKE 1 TABLET BY MOUTH AT BEDTIME 05/24/15  Yes Clearnce Sorrel Burnette, PA-C  traMADol (ULTRAM) 50 MG tablet Take by mouth. 09/28/14  Yes Historical Provider, MD    Patient Active Problem List   Diagnosis Date Noted  . CN (constipation) 05/23/2015  . DDD (degenerative disc disease), lumbar 05/23/2015  . Diabetes mellitus type 2 without retinopathy ([place]) 05/23/2015  . Fatigue 05/23/2015  . Acid reflux 05/23/2015  . LBP (low back pain) 05/23/2015  . Drusen of macula 05/23/2015  . Neuralgia neuritis, sciatic nerve 05/23/2015  . Basal cell papilloma 05/23/2015  . Acute urinary retention 03/28/2015  . Lumbar foraminal stenosis 03/14/2015  . Chronic cystitis 03/03/2015  . Cystocele, grade 2 02/18/2015  . History of kidney stones 02/18/2015  . Atrophic vaginitis 02/15/2015  . Recurrent UTI 02/15/2015  . Displacement of lumbar intervertebral disc without myelopathy 12/01/2014  . Bulge of lumbar disc without myelopathy 12/01/2014  . Neuritis or radiculitis due to rupture of lumbar intervertebral disc 11/10/2014  . Internal hemorrhoids without complication [id number]  . Flutter-fibrillation 03/22/2008  . Paroxysmal supraventricular tachycardia ([place]) 03/22/2008  . Colon, diverticulosis 10/28/2007  . History of colon polyps 10/28/2007  . Malignant neoplasm of skin of parts of face 10/28/2007  . Calculus of kidney 10/18/2007  . Benign essential HTN 10/18/2007  . OP (osteoporosis) 10/18/2007  . Diabetes ([place]) 10/16/2007  . Hypercholesteremia 10/16/2007    Past Medical History  Diagnosis Date  . Diabetes mellitus without complication ([place])   . Hypertension   . GERD (gastroesophageal reflux disease)   . DDD (degenerative disc disease), lumbar   . Osteoporosis   . Allergy    . Hyperlipidemia   . History of kidney stones   . Urinary incontinence   . Acid reflux   . History of renal stone   . UTI (urinary tract infection)     currently on antibiotic  . Cystitis     Social History   Social History  . Marital Status: Married    Spouse Name: N/A  . Number of Children: N/A  . Years of Education: N/A   Occupational History  . Not on file.   Social History Main Topics  . Smoking status: Never Smoker   . Smokeless tobacco: Not on file  . Alcohol Use: No  . Drug Use: No  . Sexual Activity: Not Currently   Other Topics Concern  . Not on file   Social History Narrative    No Known Allergies  Review of Systems  Constitutional: Negative.   HENT: Negative.   Eyes: Negative.   Respiratory: Negative.   Cardiovascular: Negative.   Gastrointestinal: Negative.   Genitourinary: Negative.   Musculoskeletal: Positive for back pain.       Right upper arm pain  Skin: Negative.   Neurological: Negative.   Endo/Heme/Allergies: Negative.   Psychiatric/Behavioral: Negative.     Immunization History  Administered Date(s) Administered  . Influenza, High Dose Seasonal PF 05/23/2015   Objective:  BP 156/72 mmHg  Pulse 66  Temp(Src) 98.1 F (36.7 C) (Oral)  Resp 12  Wt 144 lb (65.318 kg)  LMP  (LMP Unknown)  Physical Exam  Constitutional: She is oriented to person, place, and time and well-developed, well-nourished, and in no distress.  HENT:  Head: Normocephalic and atraumatic.  Right Ear: External ear normal.  Left Ear: External ear normal.  Nose: Nose normal.  Eyes: Conjunctivae and EOM are normal. Pupils are equal, round, and reactive to light.  Neck: Normal range of motion. Neck supple.  Cardiovascular: Normal rate, regular rhythm, normal heart sounds and intact distal pulses.   Pulmonary/Chest: Effort normal and breath sounds normal.  Abdominal: Soft.  Musculoskeletal: Normal range of motion.  Mild tenderness of the area of the right  biceps tendon. She has some discomfort with abduction right arm/shoulder  Neurological: She is alert and oriented to person, place, and time. She has normal reflexes. Gait normal. GCS score is 15.  Skin: Skin is warm and dry.  Psychiatric: Mood, memory, affect and judgment normal.    Lab Results  Component Value Date   WBC 5.9 03/07/2015   HGB 12.0 03/07/2015   HCT 37.0 03/07/2015   PLT 177 03/07/2015   GLUCOSE 123* 03/07/2015   HGBA1C 6.7* 03/07/2015    CMP     Component Value Date/Time   NA 138 03/07/2015 1226   K 3.9 03/07/2015 1226   CL 107 03/07/2015 1226   CO2 24 03/07/2015 1226   GLUCOSE 123* 03/07/2015 1226   BUN 10 03/07/2015 1226   CREATININE 0.88 07/28/2015 1115   CALCIUM 9.8 03/07/2015 1226   GFRNONAA 58* 07/28/2015 1115   GFRAA >60 07/28/2015 1115    Assessment and Plan :  1. Benign essential HTN Stable.   2. Diabetes mellitus type 2 without retinopathy ([place])  - POCT HgB A1C--6.7 today  3. Low back pain, unspecified back pain laterality, with sciatica presence unspecified   4. DDD (degenerative disc disease), lumbar   5. Pain of right upper extremity Xray shoulder and humerus. May need referral to Ortho. Pain is already being treated because of her DDD and chronic back pain  Patient was seen and examined by Dr. Miguel Aschoff, and noted scribed by Webb Laws, Warner MD [facility] 11/07/2015 10:02 AM

## 2015-11-14 ENCOUNTER — Other Ambulatory Visit: Payer: Self-pay

## 2015-11-14 MED ORDER — GABAPENTIN 100 MG PO CAPS: ORAL_CAPSULE | ORAL | Status: DC

## 2015-12-03 ENCOUNTER — Other Ambulatory Visit: Payer: Self-pay | Admitting: Family Medicine

## 2015-12-07 ENCOUNTER — Telehealth: Payer: Self-pay | Admitting: Family Medicine

## 2015-12-07 DIAGNOSIS — L989 Disorder of the skin and subcutaneous tissue, unspecified: Secondary | ICD-10-CM

## 2015-12-07 DIAGNOSIS — Z808 Family history of malignant neoplasm of other organs or systems: Secondary | ICD-10-CM

## 2015-12-07 NOTE — Telephone Encounter (Signed)
Please review-aa 

## 2015-12-07 NOTE — Telephone Encounter (Signed)
Pt's daughter [name] would like to get a referral for pt to see a dermatologist. She stated pt's dermatologist retired and pt is having problems on her face where she had skin cancer removed before. [name] stated that pt has a spot on her nose that won't heal. I advised that we may need to see pt for an OV and advised that Dr. Rosanna Randy is out of the office this week. Thanks TNP

## 2015-12-07 NOTE — Telephone Encounter (Signed)
Referral for dermatology placed.

## 2015-12-07 NOTE — Telephone Encounter (Signed)
Noted-aa 

## 2016-01-17 DIAGNOSIS — C4491 Basal cell carcinoma of skin, unspecified: Secondary | ICD-10-CM | POA: Insufficient documentation

## 2016-01-17 DIAGNOSIS — Z08 Encounter for follow-up examination after completed treatment for malignant neoplasm: Secondary | ICD-10-CM | POA: Diagnosis not present

## 2016-01-17 DIAGNOSIS — L821 Other seborrheic keratosis: Secondary | ICD-10-CM | POA: Diagnosis not present

## 2016-01-17 DIAGNOSIS — C44519 Basal cell carcinoma of skin of other part of trunk: Secondary | ICD-10-CM | POA: Diagnosis not present

## 2016-01-17 DIAGNOSIS — C44319 Basal cell carcinoma of skin of other parts of face: Secondary | ICD-10-CM | POA: Diagnosis not present

## 2016-01-17 DIAGNOSIS — D485 Neoplasm of uncertain behavior of skin: Secondary | ICD-10-CM | POA: Diagnosis not present

## 2016-01-17 DIAGNOSIS — Z85828 Personal history of other malignant neoplasm of skin: Secondary | ICD-10-CM | POA: Diagnosis not present

## 2016-01-30 ENCOUNTER — Other Ambulatory Visit: Payer: Self-pay | Admitting: Family Medicine

## 2016-02-09 DIAGNOSIS — C4441 Basal cell carcinoma of skin of scalp and neck: Secondary | ICD-10-CM | POA: Diagnosis not present

## 2016-02-09 DIAGNOSIS — L905 Scar conditions and fibrosis of skin: Secondary | ICD-10-CM | POA: Diagnosis not present

## 2016-03-01 ENCOUNTER — Ambulatory Visit (INDEPENDENT_AMBULATORY_CARE_PROVIDER_SITE_OTHER): Payer: Medicare Other | Admitting: Family Medicine

## 2016-03-01 VITALS — BP 164/82 | HR 72 | Temp 97.3°F | Resp 16 | Wt 142.0 lb

## 2016-03-01 DIAGNOSIS — G8929 Other chronic pain: Secondary | ICD-10-CM

## 2016-03-01 DIAGNOSIS — M25511 Pain in right shoulder: Secondary | ICD-10-CM | POA: Diagnosis not present

## 2016-03-01 DIAGNOSIS — M25512 Pain in left shoulder: Secondary | ICD-10-CM

## 2016-03-01 NOTE — Progress Notes (Signed)
Lynn Wall  MRN: [id number] DOB: 26-Jan-1927  Subjective:  HPI   The patient is an 82 year old female who presents for follow up on her chronic pain.  She states she is in constant pain.  She states on pain scale her pain is 8-9 before taking Tramadol and 5-6 after she takes it.  The patient is no longer taking any Meloxicam, Naproxen or Evista.  Patient also complains that her legs hurt and feel like they are burning.  Patient Active Problem List   Diagnosis Date Noted  . CN (constipation) 05/23/2015  . DDD (degenerative disc disease), lumbar 05/23/2015  . Diabetes mellitus type 2 without retinopathy ([place]) 05/23/2015  . Fatigue 05/23/2015  . Acid reflux 05/23/2015  . LBP (low back pain) 05/23/2015  . Drusen of macula 05/23/2015  . Neuralgia neuritis, sciatic nerve 05/23/2015  . Basal cell papilloma 05/23/2015  . Acute urinary retention 03/28/2015  . Lumbar foraminal stenosis 03/14/2015  . Chronic cystitis 03/03/2015  . Cystocele, grade 2 02/18/2015  . History of kidney stones 02/18/2015  . Atrophic vaginitis 02/15/2015  . Recurrent UTI 02/15/2015  . Displacement of lumbar intervertebral disc without myelopathy 12/01/2014  . Bulge of lumbar disc without myelopathy 12/01/2014  . Neuritis or radiculitis due to rupture of lumbar intervertebral disc 11/10/2014  . Internal hemorrhoids without complication [id number]  . Flutter-fibrillation 03/22/2008  . Paroxysmal supraventricular tachycardia ([place]) 03/22/2008  . Colon, diverticulosis 10/28/2007  . History of colon polyps 10/28/2007  . Malignant neoplasm of skin of parts of face 10/28/2007  . Calculus of kidney 10/18/2007  . Benign essential HTN 10/18/2007  . OP (osteoporosis) 10/18/2007  . Diabetes ([place]) 10/16/2007  . Hypercholesteremia 10/16/2007    Past Medical History  Diagnosis Date  . Diabetes mellitus without complication ([place])   . Hypertension   . GERD (gastroesophageal reflux disease)   . DDD (degenerative  disc disease), lumbar   . Osteoporosis   . Allergy   . Hyperlipidemia   . History of kidney stones   . Urinary incontinence   . Acid reflux   . History of renal stone   . UTI (urinary tract infection)     currently on antibiotic  . Cystitis     Social History   Social History  . Marital Status: Married    Spouse Name: N/A  . Number of Children: N/A  . Years of Education: N/A   Occupational History  . Not on file.   Social History Main Topics  . Smoking status: Never Smoker   . Smokeless tobacco: Not on file  . Alcohol Use: No  . Drug Use: No  . Sexual Activity: Not Currently   Other Topics Concern  . Not on file   Social History Narrative    Outpatient Prescriptions Prior to Visit  Medication Sig Dispense Refill  . aspirin EC 81 MG tablet Take 81 mg by mouth daily.    . enalapril (VASOTEC) 10 MG tablet 1 TABLET, ORAL, DAILY FOR BLOOD PRESSURE 90 tablet 4  . gabapentin (NEURONTIN) 100 MG capsule 2 tablets three times daily 540 capsule 3  . metFORMIN (GLUCOPHAGE) 500 MG tablet TAKE 1 TABLET BY MOUTH TWICE A DAY 120 tablet 3  . metoprolol tartrate (LOPRESSOR) 25 MG tablet TAKE 1 TABLET TWICE A DAY 180 tablet 3  . simvastatin (ZOCOR) 40 MG tablet TAKE 1 TABLET BY MOUTH AT BEDTIME 90 tablet 3  . traMADol (ULTRAM) 50 MG tablet Take by mouth.    Marland Kitchen  cyclobenzaprine (FLEXERIL) 10 MG tablet Take 1 tablet (10 mg total) by mouth 3 (three) times daily as needed for muscle spasms. 50 tablet 1  . conjugated estrogens (PREMARIN) vaginal cream Place 1 Applicatorful vaginally daily. Patient was given a sample of vaginal estrogen cream and instructed to apply 0.5mg  (pea-sized amount)  just inside the vaginal introitus with a finger-tip every night for two weeks and then Monday, Wednesday and Friday nights. 42.5 g 6  . docusate sodium (COLACE) 100 MG capsule Take 1 capsule (100 mg total) by mouth 2 (two) times daily. 60 capsule 0  . estradiol (ESTRACE) 0.1 MG/GM vaginal cream Place 1  Applicatorful vaginally at bedtime. 42.5 g 12  . meloxicam (MOBIC) 7.5 MG tablet TAKE 1 TABLET (7.5 MG TOTAL) BY MOUTH DAILY. 30 tablet 5  . Miconazole Nitrate (MONISTAT 1 COMBO PACK VA) Place vaginally.    . naproxen sodium (ANAPROX) 220 MG tablet Take 220 mg by mouth 2 (two) times daily as needed (pain).    Marland Kitchen omeprazole (PRILOSEC) 20 MG capsule Take by mouth.    . raloxifene (EVISTA) 60 MG tablet Take 60 mg by mouth daily.  3   No facility-administered medications prior to visit.   Outpatient Encounter Prescriptions as of 03/01/2016  Medication Sig Note  . aspirin EC 81 MG tablet Take 81 mg by mouth daily.   . enalapril (VASOTEC) 10 MG tablet 1 TABLET, ORAL, DAILY FOR BLOOD PRESSURE   . gabapentin (NEURONTIN) 100 MG capsule 2 tablets three times daily   . metFORMIN (GLUCOPHAGE) 500 MG tablet TAKE 1 TABLET BY MOUTH TWICE A DAY   . metoprolol tartrate (LOPRESSOR) 25 MG tablet TAKE 1 TABLET TWICE A DAY   . simvastatin (ZOCOR) 40 MG tablet TAKE 1 TABLET BY MOUTH AT BEDTIME   . traMADol (ULTRAM) 50 MG tablet Take by mouth. 05/23/2015: Medication taken as needed.  Received from: [facility]  . cyclobenzaprine (FLEXERIL) 10 MG tablet Take 1 tablet (10 mg total) by mouth 3 (three) times daily as needed for muscle spasms.   . [DISCONTINUED] conjugated estrogens (PREMARIN) vaginal cream Place 1 Applicatorful vaginally daily. Patient was given a sample of vaginal estrogen cream and instructed to apply 0.5mg  (pea-sized amount)  just inside the vaginal introitus with a finger-tip every night for two weeks and then Monday, Wednesday and Friday nights.   . [DISCONTINUED] docusate sodium (COLACE) 100 MG capsule Take 1 capsule (100 mg total) by mouth 2 (two) times daily.   . [DISCONTINUED] estradiol (ESTRACE) 0.1 MG/GM vaginal cream Place 1 Applicatorful vaginally at bedtime.   . [DISCONTINUED] meloxicam (MOBIC) 7.5 MG tablet TAKE 1 TABLET (7.5 MG TOTAL) BY MOUTH DAILY.   . [DISCONTINUED]  Miconazole Nitrate (MONISTAT 1 COMBO PACK VA) Place vaginally.   . [DISCONTINUED] naproxen sodium (ANAPROX) 220 MG tablet Take 220 mg by mouth 2 (two) times daily as needed (pain).   . [DISCONTINUED] omeprazole (PRILOSEC) 20 MG capsule Take by mouth. 05/23/2015: PRN Received from: [facility]  . [DISCONTINUED] raloxifene (EVISTA) 60 MG tablet Take 60 mg by mouth daily. 02/01/2015: ...   No facility-administered encounter medications on file as of 03/01/2016.    No Known Allergies  Review of Systems  Constitutional: Positive for malaise/fatigue.  Respiratory: Negative for cough, shortness of breath and wheezing.   Cardiovascular: Negative for chest pain, palpitations, orthopnea and leg swelling.  Musculoskeletal: Positive for myalgias, back pain, joint pain, falls and neck pain.  Neurological: Negative for weakness.   Objective:  BP 164/82 mmHg  Pulse 72  Temp(Src) 97.3 F (36.3 C) (Oral)  Resp 16  Wt 142 lb (64.411 kg)  LMP  (LMP Unknown)  Physical Exam  Constitutional: She is oriented to person, place, and time and well-developed, well-nourished, and in no distress.  HENT:  Head: Normocephalic and atraumatic.  Right Ear: External ear normal.  Left Ear: External ear normal.  Nose: Nose normal.  Eyes: Conjunctivae are normal. Pupils are equal, round, and reactive to light.  Neck: Neck supple.  Cardiovascular: Normal rate, regular rhythm and normal heart sounds.   Pulmonary/Chest: Effort normal and breath sounds normal.  Abdominal: Soft.  Musculoskeletal: She exhibits no edema.  She has limited range of motion in both shoulders, right greater than left.  Neurological: She is alert and oriented to person, place, and time.  Skin: Skin is warm and dry.  Psychiatric: Mood, memory, affect and judgment normal.    Assessment and Plan :   1. Chronic pain LS radiculopathy - Ambulatory referral to Pain Clinic Patient is instructed on the following changes: May take  the Acetaminophen three times daily. Take Gabapentin up to 3 at bedtime. Tramadol take 1-2 during the day and 2 at bedtime.  2. Bilateral shoulder pain Rotator cuff arthropathy - Ambulatory referral to [facility] 03/01/2016 3:22 PM

## 2016-03-01 NOTE — Patient Instructions (Addendum)
May take the Acetaminophen three times daily.  Take Gabapentin up to 3 at bedtime.  Tramadol take 1-2 during the day and 2 at bedtime.

## 2016-03-05 ENCOUNTER — Other Ambulatory Visit: Payer: Self-pay

## 2016-03-05 MED ORDER — TRAMADOL HCL 50 MG PO TABS: ORAL_TABLET | ORAL | Status: DC

## 2016-03-05 NOTE — Telephone Encounter (Signed)
Fax from [place], requesting refill for [employer]

## 2016-03-06 ENCOUNTER — Other Ambulatory Visit: Payer: Self-pay

## 2016-03-06 NOTE — Telephone Encounter (Signed)
Can not find RX for Tramadol that was approved on 7/10, please reapprove-aa

## 2016-03-08 ENCOUNTER — Ambulatory Visit (INDEPENDENT_AMBULATORY_CARE_PROVIDER_SITE_OTHER): Payer: Medicare Other | Admitting: Family Medicine

## 2016-03-08 VITALS — BP 152/74 | HR 68 | Temp 97.5°F | Resp 14 | Wt 141.0 lb

## 2016-03-08 DIAGNOSIS — I10 Essential (primary) hypertension: Secondary | ICD-10-CM | POA: Diagnosis not present

## 2016-03-08 DIAGNOSIS — M791 Myalgia, unspecified site: Secondary | ICD-10-CM

## 2016-03-08 DIAGNOSIS — E119 Type 2 diabetes mellitus without complications: Secondary | ICD-10-CM | POA: Diagnosis not present

## 2016-03-08 DIAGNOSIS — E785 Hyperlipidemia, unspecified: Secondary | ICD-10-CM

## 2016-03-08 DIAGNOSIS — M2550 Pain in unspecified joint: Secondary | ICD-10-CM

## 2016-03-08 MED ORDER — TRAMADOL HCL 50 MG PO TABS: ORAL_TABLET | ORAL | Status: DC

## 2016-03-08 NOTE — Progress Notes (Signed)
Lynn Wall  MRN: [id number] DOB: December 08, 1926  Subjective:  HPI   1. Benign essential HTN Patient is an 82 year old female who presents for follow up of her hypertension.  She occasionally checks her pressure outside of the office.  She does not remember what it was when she has checked it but states that it is usually not as high as it is when she is here.  2. Diabetes mellitus type 2 without retinopathy ([place]) The patient is also here to have her DM checked.  She last had her A1C done on 11/07/15 and it was 6.7.  She checks her glucose at home and it runs 100's-130's.  She denies any hypoglycemic symptoms.  3. Hyperlipidemia It is also time for the patient to have her lipids checked.  Patient Active Problem List   Diagnosis Date Noted  . CN (constipation) 05/23/2015  . DDD (degenerative disc disease), lumbar 05/23/2015  . Diabetes mellitus type 2 without retinopathy ([place]) 05/23/2015  . Fatigue 05/23/2015  . Acid reflux 05/23/2015  . LBP (low back pain) 05/23/2015  . Drusen of macula 05/23/2015  . Neuralgia neuritis, sciatic nerve 05/23/2015  . Basal cell papilloma 05/23/2015  . Acute urinary retention 03/28/2015  . Lumbar foraminal stenosis 03/14/2015  . Chronic cystitis 03/03/2015  . Cystocele, grade 2 02/18/2015  . History of kidney stones 02/18/2015  . Atrophic vaginitis 02/15/2015  . Recurrent UTI 02/15/2015  . Displacement of lumbar intervertebral disc without myelopathy 12/01/2014  . Bulge of lumbar disc without myelopathy 12/01/2014  . Neuritis or radiculitis due to rupture of lumbar intervertebral disc 11/10/2014  . Internal hemorrhoids without complication [id number]  . Flutter-fibrillation 03/22/2008  . Paroxysmal supraventricular tachycardia ([place]) 03/22/2008  . Colon, diverticulosis 10/28/2007  . History of colon polyps 10/28/2007  . Malignant neoplasm of skin of parts of face 10/28/2007  . Calculus of kidney 10/18/2007  . Benign essential HTN 10/18/2007   . OP (osteoporosis) 10/18/2007  . Diabetes ([place]) 10/16/2007  . Hypercholesteremia 10/16/2007    Past Medical History  Diagnosis Date  . Diabetes mellitus without complication ([place])   . Hypertension   . GERD (gastroesophageal reflux disease)   . DDD (degenerative disc disease), lumbar   . Osteoporosis   . Allergy   . Hyperlipidemia   . History of kidney stones   . Urinary incontinence   . Acid reflux   . History of renal stone   . UTI (urinary tract infection)     currently on antibiotic  . Cystitis     Social History   Social History  . Marital Status: Married    Spouse Name: N/A  . Number of Children: N/A  . Years of Education: N/A   Occupational History  . Not on file.   Social History Main Topics  . Smoking status: Never Smoker   . Smokeless tobacco: Not on file  . Alcohol Use: No  . Drug Use: No  . Sexual Activity: Not Currently   Other Topics Concern  . Not on file   Social History Narrative    Outpatient Prescriptions Prior to Visit  Medication Sig Dispense Refill  . aspirin EC 81 MG tablet Take 81 mg by mouth daily.    . cyclobenzaprine (FLEXERIL) 10 MG tablet Take 1 tablet (10 mg total) by mouth 3 (three) times daily as needed for muscle spasms. 50 tablet 1  . enalapril (VASOTEC) 10 MG tablet 1 TABLET, ORAL, DAILY FOR BLOOD PRESSURE 90 tablet 4  .  gabapentin (NEURONTIN) 100 MG capsule 2 tablets three times daily 540 capsule 3  . metFORMIN (GLUCOPHAGE) 500 MG tablet TAKE 1 TABLET BY MOUTH TWICE A DAY 120 tablet 3  . metoprolol tartrate (LOPRESSOR) 25 MG tablet TAKE 1 TABLET TWICE A DAY 180 tablet 3  . simvastatin (ZOCOR) 40 MG tablet TAKE 1 TABLET BY MOUTH AT BEDTIME 90 tablet 3  . traMADol (ULTRAM) 50 MG tablet 2 tablets every 6 hours as needed 90 tablet 5   No facility-administered medications prior to visit.    No Known Allergies  Review of Systems  Constitutional: Positive for malaise/fatigue. Negative for fever.  Eyes: Negative.     Respiratory: Negative for cough, shortness of breath and wheezing.   Cardiovascular: Negative for chest pain, palpitations, orthopnea, claudication, leg swelling and PND.  Gastrointestinal: Positive for nausea.  Genitourinary: Negative.   Musculoskeletal: Positive for myalgias, back pain, joint pain and neck pain.  Skin: Negative.   Neurological: Positive for weakness. Negative for dizziness and headaches.  Endo/Heme/Allergies: Negative.   Psychiatric/Behavioral: Positive for depression.       She is starting to get depressed about her husband's dementia and her own physical disabilities as she ages.   Objective:  LMP  (LMP Unknown)  Physical Exam  Constitutional: She is oriented to person, place, and time and well-developed, well-nourished, and in no distress.  HENT:  Head: Normocephalic and atraumatic.  Right Ear: External ear normal.  Left Ear: External ear normal.  Nose: Nose normal.  Eyes: Conjunctivae are normal.  Neck: No thyromegaly present.  Cardiovascular: Normal rate, regular rhythm and normal heart sounds.   Pulmonary/Chest: Effort normal and breath sounds normal.  Abdominal: Soft.  Musculoskeletal: She exhibits tenderness.  Obvious decreased range of motion of the left shoulder with significant pain with any abduction.  Lymphadenopathy:    She has no cervical adenopathy.  Neurological: She is alert and oriented to person, place, and time.  Skin: Skin is warm and dry.  Psychiatric: Mood, memory, affect and judgment normal.    Assessment and Plan :   1. Benign essential HTN - CBC with Differential/Platelet - Comprehensive metabolic panel - TSH  2. Diabetes mellitus type 2 without retinopathy (HCC)/Prediabetes - Hemoglobin A1c  3. Hyperlipidemia - Lipid Panel With LDL/HDL Ratio  4. Myalgia - Sedimentation rate - ANA - CK - Rheumatoid factor  5. Arthralgia - Sedimentation rate - ANA - CK - Rheumatoid factor  6. Lumbo- sacral radiculopathy/DDD 50%  of this visit is spent in counseling regarding these issues. Referrals also made. She may end up requiring chronic narcotics to keep her functional. 7. Bilateral shoulder pain, left greater than right/chronic rotator cuff syndrome For her to orthopedics. It could be a component of cervical radiculopathy but I think this is mainly a shoulder impingement issue. I have done the exam and reviewed the above chart and it is accurate to the best of my knowledge.  Miguel Aschoff MD [facility] 03/08/2016 8:23 AM

## 2016-03-09 DIAGNOSIS — M7542 Impingement syndrome of left shoulder: Secondary | ICD-10-CM | POA: Diagnosis not present

## 2016-03-09 DIAGNOSIS — M7541 Impingement syndrome of right shoulder: Secondary | ICD-10-CM | POA: Diagnosis not present

## 2016-03-12 DIAGNOSIS — M2550 Pain in unspecified joint: Secondary | ICD-10-CM | POA: Diagnosis not present

## 2016-03-12 DIAGNOSIS — I10 Essential (primary) hypertension: Secondary | ICD-10-CM | POA: Diagnosis not present

## 2016-03-12 DIAGNOSIS — E119 Type 2 diabetes mellitus without complications: Secondary | ICD-10-CM | POA: Diagnosis not present

## 2016-03-12 DIAGNOSIS — M791 Myalgia: Secondary | ICD-10-CM | POA: Diagnosis not present

## 2016-03-12 DIAGNOSIS — E785 Hyperlipidemia, unspecified: Secondary | ICD-10-CM | POA: Diagnosis not present

## 2016-03-13 ENCOUNTER — Telehealth: Payer: Self-pay

## 2016-03-13 LAB — CK: Total CK: 40 U/L (ref 24–173)

## 2016-03-13 LAB — TSH: TSH: 1.1 u[IU]/mL (ref 0.450–4.500)

## 2016-03-13 LAB — CBC WITH DIFFERENTIAL/PLATELET
BASOS ABS: 0 10*3/uL (ref 0.0–0.2)
Basos: 0 %
EOS (ABSOLUTE): 0.1 10*3/uL (ref 0.0–0.4)
Eos: 1 %
Hematocrit: 34.9 % (ref 34.0–46.6)
Hemoglobin: 11.5 g/dL (ref 11.1–15.9)
IMMATURE GRANS (ABS): 0 10*3/uL (ref 0.0–0.1)
Immature Granulocytes: 0 %
LYMPHS: 28 %
Lymphocytes Absolute: 2.3 10*3/uL (ref 0.7–3.1)
MCH: 29.2 pg (ref 26.6–33.0)
MCHC: 33 g/dL (ref 31.5–35.7)
MCV: 89 fL (ref 79–97)
MONOCYTES: 7 %
Monocytes Absolute: 0.6 10*3/uL (ref 0.1–0.9)
NEUTROS ABS: 5.2 10*3/uL (ref 1.4–7.0)
Neutrophils: 64 %
PLATELETS: 244 10*3/uL (ref 150–379)
RBC: 3.94 x10E6/uL (ref 3.77–5.28)
RDW: 13.6 % (ref 12.3–15.4)
WBC: 8.1 10*3/uL (ref 3.4–10.8)

## 2016-03-13 LAB — COMPREHENSIVE METABOLIC PANEL
ALBUMIN: 4.5 g/dL (ref 3.5–4.7)
ALT: 12 IU/L (ref 0–32)
AST: 18 IU/L (ref 0–40)
Albumin/Globulin Ratio: 2 (ref 1.2–2.2)
Alkaline Phosphatase: 49 IU/L (ref 39–117)
BUN / CREAT RATIO: 31 — AB (ref 12–28)
BUN: 25 mg/dL (ref 8–27)
Bilirubin Total: 0.3 mg/dL (ref 0.0–1.2)
CALCIUM: 9.7 mg/dL (ref 8.7–10.3)
CO2: 22 mmol/L (ref 18–29)
CREATININE: 0.81 mg/dL (ref 0.57–1.00)
Chloride: 103 mmol/L (ref 96–106)
GFR, EST AFRICAN AMERICAN: 76 mL/min/{1.73_m2} (ref >59)
GFR, EST NON AFRICAN AMERICAN: 66 mL/min/{1.73_m2} (ref >59)
GLOBULIN, TOTAL: 2.3 g/dL (ref 1.5–4.5)
GLUCOSE: 102 mg/dL — AB (ref 65–99)
Potassium: 4.4 mmol/L (ref 3.5–5.2)
Sodium: 140 mmol/L (ref 134–144)
TOTAL PROTEIN: 6.8 g/dL (ref 6.0–8.5)

## 2016-03-13 LAB — LIPID PANEL WITH LDL/HDL RATIO
Cholesterol, Total: 136 mg/dL (ref 100–199)
HDL: 47 mg/dL (ref >39)
LDL CALC: 63 mg/dL (ref 0–99)
LDL/HDL RATIO: 1.3 ratio (ref 0.0–3.2)
TRIGLYCERIDES: 132 mg/dL (ref 0–149)
VLDL CHOLESTEROL CAL: 26 mg/dL (ref 5–40)

## 2016-03-13 LAB — HEMOGLOBIN A1C
Est. average glucose Bld gHb Est-mCnc: 146 mg/dL
HEMOGLOBIN A1C: 6.7 % — AB (ref 4.8–5.6)

## 2016-03-13 LAB — RHEUMATOID FACTOR: Rhuematoid fact SerPl-aCnc: <10 IU/mL (ref 0.0–13.9)

## 2016-03-13 LAB — ANA: Anti Nuclear Antibody(ANA): NEGATIVE

## 2016-03-13 LAB — SEDIMENTATION RATE: SED RATE: 14 mm/h (ref 0–40)

## 2016-03-13 NOTE — Telephone Encounter (Signed)
[name] advised. (pt's daughter)   Thanks,   Mickel Baas

## 2016-03-13 NOTE — Telephone Encounter (Signed)
-----   Message from Jerrol Banana., MD sent at 03/13/2016  8:14 AM EDT ----- Labs okay. Mild diabetes is controlkled.

## 2016-03-16 DIAGNOSIS — M50822 Other cervical disc disorders at C5-C6 level: Secondary | ICD-10-CM | POA: Diagnosis not present

## 2016-03-16 DIAGNOSIS — Z7982 Long term (current) use of aspirin: Secondary | ICD-10-CM | POA: Diagnosis not present

## 2016-03-16 DIAGNOSIS — Z7984 Long term (current) use of oral hypoglycemic drugs: Secondary | ICD-10-CM | POA: Diagnosis not present

## 2016-03-16 DIAGNOSIS — M7542 Impingement syndrome of left shoulder: Secondary | ICD-10-CM | POA: Diagnosis not present

## 2016-03-16 DIAGNOSIS — M50821 Other cervical disc disorders at C4-C5 level: Secondary | ICD-10-CM | POA: Diagnosis not present

## 2016-03-16 DIAGNOSIS — E119 Type 2 diabetes mellitus without complications: Secondary | ICD-10-CM | POA: Diagnosis not present

## 2016-03-16 DIAGNOSIS — M50823 Other cervical disc disorders at C6-C7 level: Secondary | ICD-10-CM | POA: Diagnosis not present

## 2016-03-16 DIAGNOSIS — R262 Difficulty in walking, not elsewhere classified: Secondary | ICD-10-CM | POA: Diagnosis not present

## 2016-03-16 DIAGNOSIS — M19011 Primary osteoarthritis, right shoulder: Secondary | ICD-10-CM | POA: Diagnosis not present

## 2016-03-16 DIAGNOSIS — M7541 Impingement syndrome of right shoulder: Secondary | ICD-10-CM | POA: Diagnosis not present

## 2016-03-19 DIAGNOSIS — R262 Difficulty in walking, not elsewhere classified: Secondary | ICD-10-CM | POA: Diagnosis not present

## 2016-03-19 DIAGNOSIS — M7541 Impingement syndrome of right shoulder: Secondary | ICD-10-CM | POA: Diagnosis not present

## 2016-03-19 DIAGNOSIS — M7542 Impingement syndrome of left shoulder: Secondary | ICD-10-CM | POA: Diagnosis not present

## 2016-03-19 DIAGNOSIS — E119 Type 2 diabetes mellitus without complications: Secondary | ICD-10-CM | POA: Diagnosis not present

## 2016-03-19 DIAGNOSIS — M50821 Other cervical disc disorders at C4-C5 level: Secondary | ICD-10-CM | POA: Diagnosis not present

## 2016-03-19 DIAGNOSIS — M19011 Primary osteoarthritis, right shoulder: Secondary | ICD-10-CM | POA: Diagnosis not present

## 2016-03-21 DIAGNOSIS — E119 Type 2 diabetes mellitus without complications: Secondary | ICD-10-CM | POA: Diagnosis not present

## 2016-03-21 DIAGNOSIS — M7542 Impingement syndrome of left shoulder: Secondary | ICD-10-CM | POA: Diagnosis not present

## 2016-03-21 DIAGNOSIS — M50821 Other cervical disc disorders at C4-C5 level: Secondary | ICD-10-CM | POA: Diagnosis not present

## 2016-03-21 DIAGNOSIS — M19011 Primary osteoarthritis, right shoulder: Secondary | ICD-10-CM | POA: Diagnosis not present

## 2016-03-21 DIAGNOSIS — M7541 Impingement syndrome of right shoulder: Secondary | ICD-10-CM | POA: Diagnosis not present

## 2016-03-21 DIAGNOSIS — R262 Difficulty in walking, not elsewhere classified: Secondary | ICD-10-CM | POA: Diagnosis not present

## 2016-03-22 DIAGNOSIS — E119 Type 2 diabetes mellitus without complications: Secondary | ICD-10-CM | POA: Diagnosis not present

## 2016-03-22 DIAGNOSIS — R262 Difficulty in walking, not elsewhere classified: Secondary | ICD-10-CM | POA: Diagnosis not present

## 2016-03-22 DIAGNOSIS — M19011 Primary osteoarthritis, right shoulder: Secondary | ICD-10-CM | POA: Diagnosis not present

## 2016-03-22 DIAGNOSIS — M50821 Other cervical disc disorders at C4-C5 level: Secondary | ICD-10-CM | POA: Diagnosis not present

## 2016-03-22 DIAGNOSIS — M7542 Impingement syndrome of left shoulder: Secondary | ICD-10-CM | POA: Diagnosis not present

## 2016-03-22 DIAGNOSIS — M7541 Impingement syndrome of right shoulder: Secondary | ICD-10-CM | POA: Diagnosis not present

## 2016-03-23 ENCOUNTER — Ambulatory Visit (INDEPENDENT_AMBULATORY_CARE_PROVIDER_SITE_OTHER): Payer: Medicare Other | Admitting: Family Medicine

## 2016-03-23 VITALS — BP 134/78 | HR 80 | Temp 97.6°F | Resp 18 | Wt 136.0 lb

## 2016-03-23 DIAGNOSIS — R630 Anorexia: Secondary | ICD-10-CM

## 2016-03-23 DIAGNOSIS — R5383 Other fatigue: Secondary | ICD-10-CM | POA: Diagnosis not present

## 2016-03-23 DIAGNOSIS — R112 Nausea with vomiting, unspecified: Secondary | ICD-10-CM | POA: Diagnosis not present

## 2016-03-23 MED ORDER — MIRTAZAPINE 30 MG PO TABS: 30.0000 mg | ORAL_TABLET | Freq: Every day | ORAL | 5 refills | Status: DC

## 2016-03-23 MED ORDER — ONDANSETRON HCL 4 MG PO TABS: ORAL_TABLET | ORAL | 2 refills | Status: DC

## 2016-03-23 MED ORDER — OMEPRAZOLE 20 MG PO CPDR: 20.0000 mg | DELAYED_RELEASE_CAPSULE | Freq: Every day | ORAL | 3 refills | Status: DC

## 2016-03-23 NOTE — Progress Notes (Signed)
Subjective:  HPI  Patient is here to discuss feeling nauseas and weak. She has had nausea for over a month now, it use to be off and on but now it is constant no matter the foods she tries to eat. She did vomit on Wednesday, that was the only day. She has been trying to take Phenergan a couple of times her daughter gave her. For stools she takes stool softner every other day. She does not have any stomach pain but does feel bloated after eating in the evening. She has issues with acid reflux if she eats after 7 pm especially and has to take sential oil for severe episodes of that. She still has gallbladder and appendix. She has history of gallstones and kidney stones. She feels very fatigue. Wt Readings from Last 3 Encounters:  03/23/16 136 lb (61.7 kg)  03/08/16 141 lb (64 kg)  03/01/16 142 lb (64.4 kg)    Prior to Admission medications   Medication Sig Start Date End Date Taking? Authorizing Provider  aspirin EC 81 MG tablet Take 81 mg by mouth daily.   Yes Historical Provider, MD  cyclobenzaprine (FLEXERIL) 10 MG tablet Take 1 tablet (10 mg total) by mouth 3 (three) times daily as needed for muscle spasms. 03/15/15  Yes Newman Pies, MD  enalapril (VASOTEC) 10 MG tablet 1 TABLET, ORAL, DAILY FOR BLOOD PRESSURE 05/23/15  Yes Jerrol Banana., MD  gabapentin (NEURONTIN) 100 MG capsule 2 tablets three times daily 11/14/15  Yes Kensy Blizard Maceo Pro., MD  metFORMIN (GLUCOPHAGE) 500 MG tablet TAKE 1 TABLET BY MOUTH TWICE A DAY 01/30/16  Yes Jerrol Banana., MD  metoprolol tartrate (LOPRESSOR) 25 MG tablet TAKE 1 TABLET TWICE A DAY 10/31/15  Yes Jerrol Banana., MD  simvastatin (ZOCOR) 40 MG tablet TAKE 1 TABLET BY MOUTH AT BEDTIME 05/24/15  Yes Mar Daring, PA-C  traMADol Veatrice Bourbon) 50 MG tablet 2 tablets every 6 hours as needed 03/08/16  Yes Sidni Fusco Maceo Pro., MD    Patient Active Problem List   Diagnosis Date Noted  . CN (constipation) 05/23/2015  . DDD  (degenerative disc disease), lumbar 05/23/2015  . Diabetes mellitus type 2 without retinopathy ([place]) 05/23/2015  . Fatigue 05/23/2015  . Acid reflux 05/23/2015  . LBP (low back pain) 05/23/2015  . Drusen of macula 05/23/2015  . Neuralgia neuritis, sciatic nerve 05/23/2015  . Basal cell papilloma 05/23/2015  . Acute urinary retention 03/28/2015  . Lumbar foraminal stenosis 03/14/2015  . Chronic cystitis 03/03/2015  . Cystocele, grade 2 02/18/2015  . History of kidney stones 02/18/2015  . Atrophic vaginitis 02/15/2015  . Recurrent UTI 02/15/2015  . Displacement of lumbar intervertebral disc without myelopathy 12/01/2014  . Bulge of lumbar disc without myelopathy 12/01/2014  . Neuritis or radiculitis due to rupture of lumbar intervertebral disc 11/10/2014  . Internal hemorrhoids without complication [id number]  . Flutter-fibrillation 03/22/2008  . Paroxysmal supraventricular tachycardia ([place]) 03/22/2008  . Colon, diverticulosis 10/28/2007  . History of colon polyps 10/28/2007  . Malignant neoplasm of skin of parts of face 10/28/2007  . Calculus of kidney 10/18/2007  . Benign essential HTN 10/18/2007  . OP (osteoporosis) 10/18/2007  . Diabetes ([place]) 10/16/2007  . Hypercholesteremia 10/16/2007    Past Medical History:  Diagnosis Date  . Acid reflux   . Allergy   . Cystitis   . DDD (degenerative disc disease), lumbar   . Diabetes mellitus without complication ([place])   . GERD (  gastroesophageal reflux disease)   . History of kidney stones   . History of renal stone   . Hyperlipidemia   . Hypertension   . Osteoporosis   . Urinary incontinence   . UTI (urinary tract infection)    currently on antibiotic    Social History   Social History  . Marital status: Married    Spouse name: N/A  . Number of children: N/A  . Years of education: N/A   Occupational History  . Not on file.   Social History Main Topics  . Smoking status: Never Smoker  . Smokeless tobacco: Not on  file  . Alcohol use No  . Drug use: No  . Sexual activity: Not Currently   Other Topics Concern  . Not on file   Social History Narrative  . No narrative on file    No Known Allergies  Review of Systems  Constitutional: Positive for malaise/fatigue.  HENT: Negative.   Eyes: Negative.   Respiratory: Negative.   Cardiovascular: Negative.   Gastrointestinal: Positive for constipation, heartburn, nausea and vomiting.  Musculoskeletal: Positive for back pain, joint pain, myalgias and neck pain.  Skin: Negative.   Neurological: Positive for weakness.  Endo/Heme/Allergies: Negative.   Psychiatric/Behavioral: Negative.     Immunization History  Administered Date(s) Administered  . Influenza, High Dose Seasonal PF 05/23/2015   Objective:  BP 134/78   Pulse 80   Temp 97.6 F (36.4 C)   Resp 18   Wt 136 lb (61.7 kg)   LMP  (LMP Unknown)   BMI 24.87 kg/m   Physical Exam  Constitutional: She is oriented to person, place, and time and well-developed, well-nourished, and in no distress.  HENT:  Head: Normocephalic and atraumatic.  Right Ear: External ear normal.  Left Ear: External ear normal.  Nose: Nose normal.  Eyes: Conjunctivae are normal. No scleral icterus.  Neck: No thyromegaly present.  Cardiovascular: Normal rate, regular rhythm and normal heart sounds.   Pulmonary/Chest: Effort normal and breath sounds normal.  Abdominal: Soft. She exhibits no distension. There is no rebound and no guarding.  Lymphadenopathy:    She has no cervical adenopathy.  Neurological: She is alert and oriented to person, place, and time.  Skin: Skin is warm and dry.  Psychiatric: Mood, memory, affect and judgment normal.    Lab Results  Component Value Date   WBC 8.1 03/12/2016   HGB 12.0 03/07/2015   HCT 34.9 03/12/2016   PLT 244 03/12/2016   GLUCOSE 102 (H) 03/12/2016   CHOL 136 03/12/2016   TRIG 132 03/12/2016   HDL 47 03/12/2016   LDLCALC 63 03/12/2016   TSH 1.100  03/12/2016   HGBA1C 6.7 (H) 03/12/2016    CMP     Component Value Date/Time   NA 140 03/12/2016 1439   K 4.4 03/12/2016 1439   CL 103 03/12/2016 1439   CO2 22 03/12/2016 1439   GLUCOSE 102 (H) 03/12/2016 1439   GLUCOSE 123 (H) 03/07/2015 1226   BUN 25 03/12/2016 1439   CREATININE 0.81 03/12/2016 1439   CALCIUM 9.7 03/12/2016 1439   PROT 6.8 03/12/2016 1439   ALBUMIN 4.5 03/12/2016 1439   AST 18 03/12/2016 1439   ALT 12 03/12/2016 1439   ALKPHOS 49 03/12/2016 1439   BILITOT 0.3 03/12/2016 1439   GFRNONAA 66 03/12/2016 1439   GFRAA 76 03/12/2016 1439    Assessment and Plan :  1. Non-intractable vomiting with nausea, vomiting of unspecified type Liquids today and then  brat diet tomorrow back to regular diet in 2 days if nausea resolves. - CBC w/Diff/Platelet - Comprehensive metabolic panel - omeprazole (PRILOSEC) 20 MG capsule; Take 1 capsule (20 mg total) by mouth daily.  Dispense: 30 capsule; Refill: 3 - ondansetron (ZOFRAN) 4 MG tablet; 1/2 to 1 tablet every 6 hours as needed nausea  Dispense: 100 tablet; Refill: 2 - Lipase  2. Other fatigue  - CBC w/Diff/Platelet  3. Decreased appetite 4. Shoulder arthropathy Patient had a recent steroid injections of these. This nausea could be a side effect of this but this is not likely. It is possible so hopefully this will just resolve with time. 5. Known gallstones Clinically this does not fit with cholecystitis. Again, patient is not toxic appearing today so we'll see her back next week after treatment. 6. Chronic radiculopathy It is certainly possible that the nausea is secondary to tramadol which is the newest addition with her medications.  Miguel Aschoff MD [facility] 03/23/2016 9:56 AM

## 2016-03-24 LAB — CBC WITH DIFFERENTIAL/PLATELET
BASOS: 0 %
Basophils Absolute: 0 10*3/uL (ref 0.0–0.2)
EOS (ABSOLUTE): 0.1 10*3/uL (ref 0.0–0.4)
Eos: 1 %
HEMOGLOBIN: 11.1 g/dL (ref 11.1–15.9)
Hematocrit: 34.6 % (ref 34.0–46.6)
IMMATURE GRANS (ABS): 0 10*3/uL (ref 0.0–0.1)
IMMATURE GRANULOCYTES: 0 %
LYMPHS: 21 %
Lymphocytes Absolute: 1.6 10*3/uL (ref 0.7–3.1)
MCH: 28.8 pg (ref 26.6–33.0)
MCHC: 32.1 g/dL (ref 31.5–35.7)
MCV: 90 fL (ref 79–97)
MONOCYTES: 6 %
Monocytes Absolute: 0.4 10*3/uL (ref 0.1–0.9)
NEUTROS ABS: 5.5 10*3/uL (ref 1.4–7.0)
Neutrophils: 72 %
Platelets: 197 10*3/uL (ref 150–379)
RBC: 3.86 x10E6/uL (ref 3.77–5.28)
RDW: 13.9 % (ref 12.3–15.4)
WBC: 7.7 10*3/uL (ref 3.4–10.8)

## 2016-03-24 LAB — COMPREHENSIVE METABOLIC PANEL
ALBUMIN: 4.2 g/dL (ref 3.5–4.7)
ALK PHOS: 51 IU/L (ref 39–117)
ALT: 10 IU/L (ref 0–32)
AST: 13 IU/L (ref 0–40)
Albumin/Globulin Ratio: 1.8 (ref 1.2–2.2)
BILIRUBIN TOTAL: 0.4 mg/dL (ref 0.0–1.2)
BUN/Creatinine Ratio: 18 (ref 12–28)
BUN: 16 mg/dL (ref 8–27)
CALCIUM: 9.8 mg/dL (ref 8.7–10.3)
CHLORIDE: 98 mmol/L (ref 96–106)
CO2: 22 mmol/L (ref 18–29)
CREATININE: 0.89 mg/dL (ref 0.57–1.00)
GFR calc Af Amer: 68 mL/min/{1.73_m2} (ref >59)
GFR calc non Af Amer: 59 mL/min/{1.73_m2} — ABNORMAL LOW (ref >59)
GLUCOSE: 119 mg/dL — AB (ref 65–99)
Globulin, Total: 2.3 g/dL (ref 1.5–4.5)
Potassium: 4.1 mmol/L (ref 3.5–5.2)
Sodium: 138 mmol/L (ref 134–144)
TOTAL PROTEIN: 6.5 g/dL (ref 6.0–8.5)

## 2016-03-24 LAB — LIPASE: LIPASE: 35 U/L (ref 0–59)

## 2016-03-26 DIAGNOSIS — E119 Type 2 diabetes mellitus without complications: Secondary | ICD-10-CM | POA: Diagnosis not present

## 2016-03-26 DIAGNOSIS — M7542 Impingement syndrome of left shoulder: Secondary | ICD-10-CM | POA: Diagnosis not present

## 2016-03-26 DIAGNOSIS — M7541 Impingement syndrome of right shoulder: Secondary | ICD-10-CM | POA: Diagnosis not present

## 2016-03-26 DIAGNOSIS — M19011 Primary osteoarthritis, right shoulder: Secondary | ICD-10-CM | POA: Diagnosis not present

## 2016-03-26 DIAGNOSIS — R262 Difficulty in walking, not elsewhere classified: Secondary | ICD-10-CM | POA: Diagnosis not present

## 2016-03-26 DIAGNOSIS — M50821 Other cervical disc disorders at C4-C5 level: Secondary | ICD-10-CM | POA: Diagnosis not present

## 2016-03-27 ENCOUNTER — Ambulatory Visit (INDEPENDENT_AMBULATORY_CARE_PROVIDER_SITE_OTHER): Payer: Medicare Other | Admitting: Family Medicine

## 2016-03-27 VITALS — BP 90/56 | HR 64 | Temp 97.9°F | Wt 139.0 lb

## 2016-03-27 DIAGNOSIS — M5440 Lumbago with sciatica, unspecified side: Secondary | ICD-10-CM | POA: Diagnosis not present

## 2016-03-27 DIAGNOSIS — R110 Nausea: Secondary | ICD-10-CM | POA: Diagnosis not present

## 2016-03-27 NOTE — Progress Notes (Signed)
Lynn Wall  MRN: [id number] DOB: 1927/06/17  Subjective:  HPI   The patient is an 82 year old female who presents today for follow up of her nausea, chronic pain and decreased appetite.  She was last seen on 03/23/16.  At that time she was started on Zofran for nausea, Omeprazole for her stomach and Mirtazepine for appetite.  She has increased her weight by 3 pounds and her nausea is much improved.  She still complains of muscle pain but has not been taking any of the Tramodol.   She did note that she takes Simvastatin at night.  When asked if she had ever stopped it in order to see if her muscle pains were a side effect of that she said no.    Patient Active Problem List   Diagnosis Date Noted  . CN (constipation) 05/23/2015  . DDD (degenerative disc disease), lumbar 05/23/2015  . Diabetes mellitus type 2 without retinopathy ([place]) 05/23/2015  . Fatigue 05/23/2015  . Acid reflux 05/23/2015  . LBP (low back pain) 05/23/2015  . Drusen of macula 05/23/2015  . Neuralgia neuritis, sciatic nerve 05/23/2015  . Basal cell papilloma 05/23/2015  . Acute urinary retention 03/28/2015  . Lumbar foraminal stenosis 03/14/2015  . Chronic cystitis 03/03/2015  . Cystocele, grade 2 02/18/2015  . History of kidney stones 02/18/2015  . Atrophic vaginitis 02/15/2015  . Recurrent UTI 02/15/2015  . Displacement of lumbar intervertebral disc without myelopathy 12/01/2014  . Bulge of lumbar disc without myelopathy 12/01/2014  . Neuritis or radiculitis due to rupture of lumbar intervertebral disc 11/10/2014  . Internal hemorrhoids without complication [id number]  . Flutter-fibrillation 03/22/2008  . Paroxysmal supraventricular tachycardia ([place]) 03/22/2008  . Colon, diverticulosis 10/28/2007  . History of colon polyps 10/28/2007  . Malignant neoplasm of skin of parts of face 10/28/2007  . Calculus of kidney 10/18/2007  . Benign essential HTN 10/18/2007  . OP (osteoporosis) 10/18/2007  . Diabetes  ([place]) 10/16/2007  . Hypercholesteremia 10/16/2007    Past Medical History:  Diagnosis Date  . Acid reflux   . Allergy   . Cystitis   . DDD (degenerative disc disease), lumbar   . Diabetes mellitus without complication ([place])   . GERD (gastroesophageal reflux disease)   . History of kidney stones   . History of renal stone   . Hyperlipidemia   . Hypertension   . Osteoporosis   . Urinary incontinence   . UTI (urinary tract infection)    currently on antibiotic    Social History   Social History  . Marital status: Married    Spouse name: N/A  . Number of children: N/A  . Years of education: N/A   Occupational History  . Not on file.   Social History Main Topics  . Smoking status: Never Smoker  . Smokeless tobacco: Not on file  . Alcohol use No  . Drug use: No  . Sexual activity: Not Currently   Other Topics Concern  . Not on file   Social History Narrative  . No narrative on file    Outpatient Medications Prior to Visit  Medication Sig Dispense Refill  . aspirin EC 81 MG tablet Take 81 mg by mouth daily.    . cyclobenzaprine (FLEXERIL) 10 MG tablet Take 1 tablet (10 mg total) by mouth 3 (three) times daily as needed for muscle spasms. 50 tablet 1  . enalapril (VASOTEC) 10 MG tablet 1 TABLET, ORAL, DAILY FOR BLOOD PRESSURE 90 tablet 4  .  gabapentin (NEURONTIN) 100 MG capsule 2 tablets three times daily 540 capsule 3  . metFORMIN (GLUCOPHAGE) 500 MG tablet TAKE 1 TABLET BY MOUTH TWICE A DAY 120 tablet 3  . metoprolol tartrate (LOPRESSOR) 25 MG tablet TAKE 1 TABLET TWICE A DAY 180 tablet 3  . mirtazapine (REMERON) 30 MG tablet Take 1 tablet (30 mg total) by mouth at bedtime. 30 tablet 5  . omeprazole (PRILOSEC) 20 MG capsule Take 1 capsule (20 mg total) by mouth daily. 30 capsule 3  . ondansetron (ZOFRAN) 4 MG tablet 1/2 to 1 tablet every 6 hours as needed nausea 100 tablet 2  . simvastatin (ZOCOR) 40 MG tablet TAKE 1 TABLET BY MOUTH AT BEDTIME 90 tablet 3  .  traMADol (ULTRAM) 50 MG tablet 2 tablets every 6 hours as needed 100 tablet 5   No facility-administered medications prior to visit.     No Known Allergies  Review of Systems  Constitutional: Positive for malaise/fatigue. Negative for fever.  Respiratory: Negative for cough, shortness of breath and wheezing.   Cardiovascular: Negative for chest pain, palpitations, orthopnea, claudication, leg swelling and PND.  Gastrointestinal: Negative for abdominal pain, constipation, diarrhea, heartburn, nausea and vomiting.  Musculoskeletal: Positive for back pain, joint pain and myalgias. Negative for falls and neck pain.  Neurological: Positive for weakness.   Objective:  BP (!) 90/56   Pulse 64   Temp 97.9 F (36.6 C) (Oral)   Wt 139 lb (63 kg)   LMP  (LMP Unknown)   BMI 25.42 kg/m   Physical Exam  Constitutional: She is oriented to person, place, and time and well-developed, well-nourished, and in no distress.  HENT:  Head: Normocephalic and atraumatic.  Right Ear: External ear normal.  Left Ear: External ear normal.  Nose: Nose normal.  Eyes: Conjunctivae are normal. Pupils are equal, round, and reactive to light.  Neck: Normal range of motion.  Cardiovascular: Normal rate, regular rhythm and normal heart sounds.   Pulmonary/Chest: Effort normal and breath sounds normal.  Abdominal: Soft.  Neurological: She is alert and oriented to person, place, and time.  Skin: Skin is warm and dry.  Psychiatric: Mood, memory, affect and judgment normal.    Assessment and Plan :  Chronic back pain Myalgia Stop simvastatin for now. Chronic LS DDD Likely chronic cervical DDD Bilateral shoulder arthropathy Nausea improved Probable asymptomatic gallstones  Miguel Aschoff MD [facility] 03/27/2016 2:15 PM

## 2016-03-27 NOTE — Patient Instructions (Addendum)
Gabapentin  nerve pain) 100 mg 2 pills at breakfast, 3 pills in the evening  Tramodol (narcotic pain med)  50 mg 1 pill every 6 hours if having pain.  Take during the night if you wake up and have pain.  Mirtazipine (appetite) 30 mg take 1 pill when you go to bed.  Stop Simvastatin (cholesterol) until further discussion.    Keep appointment in September.

## 2016-03-28 DIAGNOSIS — M7541 Impingement syndrome of right shoulder: Secondary | ICD-10-CM | POA: Diagnosis not present

## 2016-03-28 DIAGNOSIS — E119 Type 2 diabetes mellitus without complications: Secondary | ICD-10-CM | POA: Diagnosis not present

## 2016-03-28 DIAGNOSIS — M50821 Other cervical disc disorders at C4-C5 level: Secondary | ICD-10-CM | POA: Diagnosis not present

## 2016-03-28 DIAGNOSIS — M19011 Primary osteoarthritis, right shoulder: Secondary | ICD-10-CM | POA: Diagnosis not present

## 2016-03-28 DIAGNOSIS — M7542 Impingement syndrome of left shoulder: Secondary | ICD-10-CM | POA: Diagnosis not present

## 2016-03-28 DIAGNOSIS — R262 Difficulty in walking, not elsewhere classified: Secondary | ICD-10-CM | POA: Diagnosis not present

## 2016-04-02 DIAGNOSIS — E119 Type 2 diabetes mellitus without complications: Secondary | ICD-10-CM | POA: Diagnosis not present

## 2016-04-02 DIAGNOSIS — R262 Difficulty in walking, not elsewhere classified: Secondary | ICD-10-CM | POA: Diagnosis not present

## 2016-04-02 DIAGNOSIS — M50821 Other cervical disc disorders at C4-C5 level: Secondary | ICD-10-CM | POA: Diagnosis not present

## 2016-04-02 DIAGNOSIS — M7541 Impingement syndrome of right shoulder: Secondary | ICD-10-CM | POA: Diagnosis not present

## 2016-04-02 DIAGNOSIS — M7542 Impingement syndrome of left shoulder: Secondary | ICD-10-CM | POA: Diagnosis not present

## 2016-04-02 DIAGNOSIS — M19011 Primary osteoarthritis, right shoulder: Secondary | ICD-10-CM | POA: Diagnosis not present

## 2016-04-03 ENCOUNTER — Other Ambulatory Visit: Payer: Self-pay | Admitting: Urology

## 2016-04-03 NOTE — Telephone Encounter (Signed)
Pt pharmacy sent a refill request of premarin cream. Refill was not given as pt did not follow up in 57mo as [facility] requested.

## 2016-04-04 DIAGNOSIS — M19011 Primary osteoarthritis, right shoulder: Secondary | ICD-10-CM | POA: Diagnosis not present

## 2016-04-04 DIAGNOSIS — M7542 Impingement syndrome of left shoulder: Secondary | ICD-10-CM | POA: Diagnosis not present

## 2016-04-04 DIAGNOSIS — M7541 Impingement syndrome of right shoulder: Secondary | ICD-10-CM | POA: Diagnosis not present

## 2016-04-04 DIAGNOSIS — E119 Type 2 diabetes mellitus without complications: Secondary | ICD-10-CM | POA: Diagnosis not present

## 2016-04-04 DIAGNOSIS — M50821 Other cervical disc disorders at C4-C5 level: Secondary | ICD-10-CM | POA: Diagnosis not present

## 2016-04-04 DIAGNOSIS — R262 Difficulty in walking, not elsewhere classified: Secondary | ICD-10-CM | POA: Diagnosis not present

## 2016-04-09 DIAGNOSIS — M50821 Other cervical disc disorders at C4-C5 level: Secondary | ICD-10-CM | POA: Diagnosis not present

## 2016-04-09 DIAGNOSIS — M7542 Impingement syndrome of left shoulder: Secondary | ICD-10-CM | POA: Diagnosis not present

## 2016-04-09 DIAGNOSIS — R262 Difficulty in walking, not elsewhere classified: Secondary | ICD-10-CM | POA: Diagnosis not present

## 2016-04-09 DIAGNOSIS — M19011 Primary osteoarthritis, right shoulder: Secondary | ICD-10-CM | POA: Diagnosis not present

## 2016-04-09 DIAGNOSIS — M7541 Impingement syndrome of right shoulder: Secondary | ICD-10-CM | POA: Diagnosis not present

## 2016-04-09 DIAGNOSIS — E119 Type 2 diabetes mellitus without complications: Secondary | ICD-10-CM | POA: Diagnosis not present

## 2016-04-11 DIAGNOSIS — M19011 Primary osteoarthritis, right shoulder: Secondary | ICD-10-CM | POA: Diagnosis not present

## 2016-04-11 DIAGNOSIS — M7542 Impingement syndrome of left shoulder: Secondary | ICD-10-CM | POA: Diagnosis not present

## 2016-04-11 DIAGNOSIS — E119 Type 2 diabetes mellitus without complications: Secondary | ICD-10-CM | POA: Diagnosis not present

## 2016-04-11 DIAGNOSIS — R262 Difficulty in walking, not elsewhere classified: Secondary | ICD-10-CM | POA: Diagnosis not present

## 2016-04-11 DIAGNOSIS — M50821 Other cervical disc disorders at C4-C5 level: Secondary | ICD-10-CM | POA: Diagnosis not present

## 2016-04-11 DIAGNOSIS — M7541 Impingement syndrome of right shoulder: Secondary | ICD-10-CM | POA: Diagnosis not present

## 2016-04-16 DIAGNOSIS — M19011 Primary osteoarthritis, right shoulder: Secondary | ICD-10-CM | POA: Diagnosis not present

## 2016-04-16 DIAGNOSIS — M50821 Other cervical disc disorders at C4-C5 level: Secondary | ICD-10-CM | POA: Diagnosis not present

## 2016-04-16 DIAGNOSIS — M7541 Impingement syndrome of right shoulder: Secondary | ICD-10-CM | POA: Diagnosis not present

## 2016-04-16 DIAGNOSIS — R262 Difficulty in walking, not elsewhere classified: Secondary | ICD-10-CM | POA: Diagnosis not present

## 2016-04-16 DIAGNOSIS — M7542 Impingement syndrome of left shoulder: Secondary | ICD-10-CM | POA: Diagnosis not present

## 2016-04-16 DIAGNOSIS — E119 Type 2 diabetes mellitus without complications: Secondary | ICD-10-CM | POA: Diagnosis not present

## 2016-04-20 DIAGNOSIS — M7542 Impingement syndrome of left shoulder: Secondary | ICD-10-CM | POA: Diagnosis not present

## 2016-04-20 DIAGNOSIS — M542 Cervicalgia: Secondary | ICD-10-CM | POA: Diagnosis not present

## 2016-04-20 DIAGNOSIS — M7541 Impingement syndrome of right shoulder: Secondary | ICD-10-CM | POA: Diagnosis not present

## 2016-04-23 DIAGNOSIS — M19011 Primary osteoarthritis, right shoulder: Secondary | ICD-10-CM | POA: Diagnosis not present

## 2016-04-23 DIAGNOSIS — M7541 Impingement syndrome of right shoulder: Secondary | ICD-10-CM | POA: Diagnosis not present

## 2016-04-23 DIAGNOSIS — M7542 Impingement syndrome of left shoulder: Secondary | ICD-10-CM | POA: Diagnosis not present

## 2016-04-23 DIAGNOSIS — E119 Type 2 diabetes mellitus without complications: Secondary | ICD-10-CM | POA: Diagnosis not present

## 2016-04-23 DIAGNOSIS — R262 Difficulty in walking, not elsewhere classified: Secondary | ICD-10-CM | POA: Diagnosis not present

## 2016-04-23 DIAGNOSIS — M50821 Other cervical disc disorders at C4-C5 level: Secondary | ICD-10-CM | POA: Diagnosis not present

## 2016-05-14 ENCOUNTER — Ambulatory Visit (INDEPENDENT_AMBULATORY_CARE_PROVIDER_SITE_OTHER): Payer: Medicare Other | Admitting: Family Medicine

## 2016-05-14 VITALS — BP 128/76 | HR 74 | Temp 98.4°F | Resp 12 | Wt 139.0 lb

## 2016-05-14 DIAGNOSIS — M791 Myalgia, unspecified site: Secondary | ICD-10-CM

## 2016-05-14 DIAGNOSIS — K5909 Other constipation: Secondary | ICD-10-CM

## 2016-05-14 DIAGNOSIS — E785 Hyperlipidemia, unspecified: Secondary | ICD-10-CM

## 2016-05-14 DIAGNOSIS — R110 Nausea: Secondary | ICD-10-CM | POA: Diagnosis not present

## 2016-05-14 DIAGNOSIS — Z23 Encounter for immunization: Secondary | ICD-10-CM

## 2016-05-14 DIAGNOSIS — G8929 Other chronic pain: Secondary | ICD-10-CM | POA: Diagnosis not present

## 2016-05-14 MED ORDER — DOCUSATE SODIUM 100 MG PO CAPS: 100.0000 mg | ORAL_CAPSULE | Freq: Every day | ORAL | 0 refills | Status: AC

## 2016-05-14 MED ORDER — POLYETHYLENE GLYCOL 3350 17 GM/SCOOP PO POWD: 17.0000 g | Freq: Two times a day (BID) | ORAL | 1 refills | Status: DC | PRN

## 2016-05-14 NOTE — Progress Notes (Signed)
Lynn Wall  MRN: [id number] DOB: 25-Sep-1926  Subjective:  HPI  Patient is here for follow up. Last visit was in August and at that time for muscle pain patient was advised to stop Simvastatin which she did and muscle pain is better. She also saw her orthopedic doctor since then and was put on Meloxicam which  She takes daily with Tramadol for pain. She is feeling a good bit better with her chronic problems. Nausea is still present off and on and she uses Zofran. She has also been constipated after the first week took Citrus nitrate and she had a bowel movement at that time and now she is constipated again . She has used Miralax and suppository with no relief. She is having abdominal cramps and gargling noises present. Also of note is her husband's dementia is under improved control and that has made a big difference in her quality of life. Patient Active Problem List   Diagnosis Date Noted  . CN (constipation) 05/23/2015  . DDD (degenerative disc disease), lumbar 05/23/2015  . Diabetes mellitus type 2 without retinopathy ([place]) 05/23/2015  . Fatigue 05/23/2015  . Acid reflux 05/23/2015  . LBP (low back pain) 05/23/2015  . Drusen of macula 05/23/2015  . Neuralgia neuritis, sciatic nerve 05/23/2015  . Basal cell papilloma 05/23/2015  . Acute urinary retention 03/28/2015  . Lumbar foraminal stenosis 03/14/2015  . Chronic cystitis 03/03/2015  . Cystocele, grade 2 02/18/2015  . History of kidney stones 02/18/2015  . Atrophic vaginitis 02/15/2015  . Recurrent UTI 02/15/2015  . Displacement of lumbar intervertebral disc without myelopathy 12/01/2014  . Bulge of lumbar disc without myelopathy 12/01/2014  . Neuritis or radiculitis due to rupture of lumbar intervertebral disc 11/10/2014  . Internal hemorrhoids without complication [id number]  . Flutter-fibrillation 03/22/2008  . Paroxysmal supraventricular tachycardia ([place]) 03/22/2008  . Colon, diverticulosis 10/28/2007  . History  of colon polyps 10/28/2007  . Malignant neoplasm of skin of parts of face 10/28/2007  . Calculus of kidney 10/18/2007  . Benign essential HTN 10/18/2007  . OP (osteoporosis) 10/18/2007  . Diabetes ([place]) 10/16/2007  . Hypercholesteremia 10/16/2007    Past Medical History:  Diagnosis Date  . Acid reflux   . Allergy   . Cystitis   . DDD (degenerative disc disease), lumbar   . Diabetes mellitus without complication ([place])   . GERD (gastroesophageal reflux disease)   . History of kidney stones   . History of renal stone   . Hyperlipidemia   . Hypertension   . Osteoporosis   . Urinary incontinence   . UTI (urinary tract infection)    currently on antibiotic    Social History   Social History  . Marital status: Married    Spouse name: N/A  . Number of children: N/A  . Years of education: N/A   Occupational History  . Not on file.   Social History Main Topics  . Smoking status: Never Smoker  . Smokeless tobacco: Not on file  . Alcohol use No  . Drug use: No  . Sexual activity: Not Currently   Other Topics Concern  . Not on file   Social History Narrative  . No narrative on file    Outpatient Encounter Prescriptions as of 05/14/2016  Medication Sig Note  . aspirin EC 81 MG tablet Take 81 mg by mouth daily.   . enalapril (VASOTEC) 10 MG tablet 1 TABLET, ORAL, DAILY FOR BLOOD PRESSURE   . gabapentin (NEURONTIN) 100 MG capsule  2 tablets three times daily   . meloxicam (MOBIC) 15 MG tablet Take 1 tablet by mouth daily. 05/14/2016: Received from: External Pharmacy  . metFORMIN (GLUCOPHAGE) 500 MG tablet TAKE 1 TABLET BY MOUTH TWICE A DAY   . metoprolol tartrate (LOPRESSOR) 25 MG tablet TAKE 1 TABLET TWICE A DAY   . omeprazole (PRILOSEC) 20 MG capsule Take 1 capsule (20 mg total) by mouth daily.   . ondansetron (ZOFRAN) 4 MG tablet 1/2 to 1 tablet every 6 hours as needed nausea   . traMADol (ULTRAM) 50 MG tablet 2 tablets every 6 hours as needed   . simvastatin (ZOCOR) 40  MG tablet TAKE 1 TABLET BY MOUTH AT BEDTIME (Patient not taking: Reported on 05/14/2016)   . [DISCONTINUED] cyclobenzaprine (FLEXERIL) 10 MG tablet Take 1 tablet (10 mg total) by mouth 3 (three) times daily as needed for muscle spasms.   . [DISCONTINUED] mirtazapine (REMERON) 30 MG tablet Take 1 tablet (30 mg total) by mouth at bedtime.    No facility-administered encounter medications on file as of 05/14/2016.     No Known Allergies  Review of Systems  Constitutional: Positive for malaise/fatigue.  Eyes: Negative.   Respiratory: Negative.   Cardiovascular: Negative.   Gastrointestinal: Positive for abdominal pain, constipation and nausea.  Musculoskeletal: Positive for back pain, joint pain and myalgias.  Skin: Negative.   Neurological: Negative.   Endo/Heme/Allergies: Negative.   Psychiatric/Behavioral: Negative.    Objective:  BP 128/76   Pulse 74   Temp 98.4 F (36.9 C)   Resp 12   Wt 139 lb (63 kg)   LMP  (LMP Unknown)   BMI 25.42 kg/m   Physical Exam  Constitutional: She is oriented to person, place, and time and well-developed, well-nourished, and in no distress.  HENT:  Head: Normocephalic and atraumatic.  Right Ear: External ear normal.  Left Ear: External ear normal.  Nose: Nose normal.  Eyes: Conjunctivae are normal. Pupils are equal, round, and reactive to light.  Neck: Normal range of motion. Neck supple.  Cardiovascular: Normal rate, regular rhythm, normal heart sounds and intact distal pulses.   No murmur heard. Pulmonary/Chest: Effort normal and breath sounds normal. No respiratory distress. She has no wheezes.  Abdominal: Soft. There is no tenderness. There is no rebound.  Musculoskeletal: She exhibits no edema or tenderness.  Neurological: She is alert and oriented to person, place, and time.  Skin: Skin is warm and dry.  Psychiatric: Memory, affect and judgment normal.    Assessment and Plan :  1. Myalgia Better off Simvastatin.  2. Chronic  pain Discussed steroid injections and how often she can get it.  3. Nausea Stable.Improved.  4. Hyperlipidemia Risk and benefits discussed. Not worth the side effects of myalgia with the statin. Start Fish oil daily 5. Other constipation Advised patient she can take Miralax every day for now. Also can take Stool softener. Can take this safely and patient advised of that. Consider alternating days with taking Omeprazole. 6. Shoulder arthropathy bilateral HPI, Exam and A&P transcribed under direction and in the presence of Miguel Aschoff, MD. I have done the exam and reviewed the chart and it is accurate to the best of my knowledge. Miguel Aschoff M.D. [facility]

## 2016-05-14 NOTE — Patient Instructions (Signed)
Can take Omeprazole every other day and follow.

## 2016-05-18 ENCOUNTER — Other Ambulatory Visit: Payer: Self-pay | Admitting: Physician Assistant

## 2016-05-18 DIAGNOSIS — E7800 Pure hypercholesterolemia, unspecified: Secondary | ICD-10-CM

## 2016-05-28 ENCOUNTER — Ambulatory Visit
Admission: RE | Admit: 2016-05-28 | Discharge: 2016-05-28 | Disposition: A | Discharge status: Home / Self Care | Payer: Medicare Other | Source: Ambulatory Visit | Attending: Family Medicine | Admitting: Family Medicine

## 2016-05-28 ENCOUNTER — Encounter: Payer: Self-pay | Admitting: Family Medicine

## 2016-05-28 ENCOUNTER — Ambulatory Visit (INDEPENDENT_AMBULATORY_CARE_PROVIDER_SITE_OTHER): Payer: Medicare Other | Admitting: Family Medicine

## 2016-05-28 VITALS — BP 142/82 | HR 72 | Temp 97.8°F | Resp 16 | Wt 140.0 lb

## 2016-05-28 DIAGNOSIS — K8020 Calculus of gallbladder without cholecystitis without obstruction: Secondary | ICD-10-CM | POA: Diagnosis not present

## 2016-05-28 DIAGNOSIS — R1084 Generalized abdominal pain: Secondary | ICD-10-CM | POA: Diagnosis not present

## 2016-05-28 DIAGNOSIS — K5792 Diverticulitis of intestine, part unspecified, without perforation or abscess without bleeding: Secondary | ICD-10-CM

## 2016-05-28 DIAGNOSIS — M4186 Other forms of scoliosis, lumbar region: Secondary | ICD-10-CM | POA: Insufficient documentation

## 2016-05-28 DIAGNOSIS — R110 Nausea: Secondary | ICD-10-CM

## 2016-05-28 DIAGNOSIS — M47896 Other spondylosis, lumbar region: Secondary | ICD-10-CM | POA: Diagnosis not present

## 2016-05-28 DIAGNOSIS — K5900 Constipation, unspecified: Secondary | ICD-10-CM | POA: Diagnosis not present

## 2016-05-28 DIAGNOSIS — N200 Calculus of kidney: Secondary | ICD-10-CM | POA: Insufficient documentation

## 2016-05-28 MED ORDER — SULFAMETHOXAZOLE-TRIMETHOPRIM 800-160 MG PO TABS: 1.0000 | ORAL_TABLET | Freq: Two times a day (BID) | ORAL | 0 refills | Status: DC

## 2016-05-28 NOTE — Progress Notes (Signed)
Subjective:  HPI Pt is here for a 2 week follow. She is still having nausea but now she is having diarrhea instead of constipation. She reports that she also has cramps in her lower abdomen and feels bloated. She also reports that she is having stool incontinence now and she has never had that before. She and daughter do report that she had diverticulosis on her last colonoscopy but she does not remember what it felt like to have diverticulosis because it has been too long since she had it. Pt has not been on abx lately.   She is due to get prevnar and flu vaccines. But waiting to see what the treatment is for the above problem before they are given.   Prior to Admission medications   Medication Sig Start Date End Date Taking? Authorizing Provider  aspirin EC 81 MG tablet Take 81 mg by mouth daily.    Historical Provider, MD  docusate sodium (STOOL SOFTENER) 100 MG capsule Take 1 capsule (100 mg total) by mouth daily. 05/14/16   Everley Evora Maceo Pro., MD  enalapril (VASOTEC) 10 MG tablet 1 TABLET, ORAL, DAILY FOR BLOOD PRESSURE 05/23/15   Jerrol Banana., MD  gabapentin (NEURONTIN) 100 MG capsule 2 tablets three times daily 11/14/15   Jerrol Banana., MD  meloxicam (MOBIC) 15 MG tablet Take 1 tablet by mouth daily. 04/20/16   Historical Provider, MD  metFORMIN (GLUCOPHAGE) 500 MG tablet TAKE 1 TABLET BY MOUTH TWICE A DAY 01/30/16   Jerrol Banana., MD  metoprolol tartrate (LOPRESSOR) 25 MG tablet TAKE 1 TABLET TWICE A DAY 10/31/15   Bladen Umar Maceo Pro., MD  omeprazole (PRILOSEC) 20 MG capsule Take 1 capsule (20 mg total) by mouth daily. 03/23/16   Alazar Cherian Maceo Pro., MD  ondansetron [facility]) 4 MG tablet 1/2 to 1 tablet every 6 hours as needed nausea 03/23/16   Jerrol Banana., MD  polyethylene glycol powder (GLYCOLAX/MIRALAX) powder Take 17 g by mouth 2 (two) times daily as needed. 05/14/16   Sharbel Sahagun Maceo Pro., MD  simvastatin (ZOCOR) 40 MG tablet TAKE 1 TABLET BY MOUTH  AT BEDTIME 05/18/16   Jerrol Banana., MD  traMADol [facility]) 50 MG tablet 2 tablets every 6 hours as needed 03/08/16   Jerrol Banana., MD    Patient Active Problem List   Diagnosis Date Noted  . CN (constipation) 05/23/2015  . DDD (degenerative disc disease), lumbar 05/23/2015  . Diabetes mellitus type 2 without retinopathy ([place]) 05/23/2015  . Fatigue 05/23/2015  . Acid reflux 05/23/2015  . LBP (low back pain) 05/23/2015  . Drusen of macula 05/23/2015  . Neuralgia neuritis, sciatic nerve 05/23/2015  . Basal cell papilloma 05/23/2015  . Acute urinary retention 03/28/2015  . Lumbar foraminal stenosis 03/14/2015  . Chronic cystitis 03/03/2015  . Cystocele, grade 2 02/18/2015  . History of kidney stones 02/18/2015  . Atrophic vaginitis 02/15/2015  . Recurrent UTI 02/15/2015  . Displacement of lumbar intervertebral disc without myelopathy 12/01/2014  . Bulge of lumbar disc without myelopathy 12/01/2014  . Neuritis or radiculitis due to rupture of lumbar intervertebral disc 11/10/2014  . Internal hemorrhoids without complication [id number]  . Flutter-fibrillation 03/22/2008  . Paroxysmal supraventricular tachycardia ([place]) 03/22/2008  . Colon, diverticulosis 10/28/2007  . History of colon polyps 10/28/2007  . Malignant neoplasm of skin of parts of face 10/28/2007  . Calculus of kidney 10/18/2007  . Benign essential HTN 10/18/2007  .  OP (osteoporosis) 10/18/2007  . Diabetes ([place]) 10/16/2007  . Hypercholesteremia 10/16/2007    Past Medical History:  Diagnosis Date  . Acid reflux   . Allergy   . Cystitis   . DDD (degenerative disc disease), lumbar   . Diabetes mellitus without complication ([place])   . GERD (gastroesophageal reflux disease)   . History of kidney stones   . History of renal stone   . Hyperlipidemia   . Hypertension   . Osteoporosis   . Urinary incontinence   . UTI (urinary tract infection)    currently on antibiotic    Social History   Social  History  . Marital status: Married    Spouse name: N/A  . Number of children: N/A  . Years of education: N/A   Occupational History  . Not on file.   Social History Main Topics  . Smoking status: Never Smoker  . Smokeless tobacco: Not on file  . Alcohol use No  . Drug use: No  . Sexual activity: Not Currently   Other Topics Concern  . Not on file   Social History Narrative  . No narrative on file    No Known Allergies  Review of Systems  Constitutional: Negative.   HENT: Negative.   Eyes: Negative.   Respiratory: Negative.   Cardiovascular: Negative.   Gastrointestinal: Positive for abdominal pain, diarrhea and nausea.  Musculoskeletal: Positive for back pain.  Skin: Negative.   Neurological: Negative.   Endo/Heme/Allergies: Negative.   Psychiatric/Behavioral: Negative.     Immunization History  Administered Date(s) Administered  . Influenza, High Dose Seasonal PF 05/23/2015  . Tdap 03/27/2012   Objective:  LMP  (LMP Unknown)   Physical Exam  Constitutional: She is oriented to person, place, and time and well-developed, well-nourished, and in no distress.  HENT:  Head: Normocephalic and atraumatic.  Right Ear: External ear normal.  Left Ear: External ear normal.  Nose: Nose normal.  Eyes: Conjunctivae and EOM are normal. Pupils are equal, round, and reactive to light.  Neck: Normal range of motion. Neck supple.  Cardiovascular: Normal rate, regular rhythm, normal heart sounds and intact distal pulses.   Pulmonary/Chest: Effort normal and breath sounds normal.  Abdominal: She exhibits no distension and no mass. There is tenderness. There is no rebound and no guarding.  Minimal  tenderness without guarding or rebound very nonspecific exam. Lungs are normal.  Musculoskeletal: Normal range of motion.  Neurological: She is alert and oriented to person, place, and time. She has normal reflexes. Gait normal. GCS score is 15.  Skin: Skin is warm and dry.    Psychiatric: Mood, memory, affect and judgment normal.    Lab Results  Component Value Date   WBC 7.7 03/23/2016   HGB 12.0 03/07/2015   HCT 34.6 03/23/2016   PLT 197 03/23/2016   GLUCOSE 119 (H) 03/23/2016   CHOL 136 03/12/2016   TRIG 132 03/12/2016   HDL 47 03/12/2016   LDLCALC 63 03/12/2016   TSH 1.100 03/12/2016   HGBA1C 6.7 (H) 03/12/2016    CMP     Component Value Date/Time   NA 138 03/23/2016 1042   K 4.1 03/23/2016 1042   CL 98 03/23/2016 1042   CO2 22 03/23/2016 1042   GLUCOSE 119 (H) 03/23/2016 1042   GLUCOSE 123 (H) 03/07/2015 1226   BUN 16 03/23/2016 1042   CREATININE 0.89 03/23/2016 1042   CALCIUM 9.8 03/23/2016 1042   PROT 6.5 03/23/2016 1042   ALBUMIN 4.2 03/23/2016 1042  AST 13 03/23/2016 1042   ALT 10 03/23/2016 1042   ALKPHOS 51 03/23/2016 1042   BILITOT 0.4 03/23/2016 1042   GFRNONAA 59 (L) 03/23/2016 1042   GFRAA 68 03/23/2016 1042    Assessment and Plan :  1. Nausea   2. Generalized abdominal pain KUB ordered pending results.   3. Diverticulitis of intestine without perforation or abscess without bleeding, unspecified part of intestinal tract Treat with Septra DS and follow up next week. 4. Chronic DDD of LS-spine HPI, Exam, and A&P Transcribed under the direction and in the presence of Tempie Gibeault L. Cranford Mon, MD  Electronically Signed: Webb Laws, CMA I have done the exam and reviewed the above chart and it is accurate to the best of my knowledge.  Miguel Aschoff MD [facility] 05/28/2016 3:30 PM

## 2016-06-07 ENCOUNTER — Telehealth: Payer: Self-pay | Admitting: Family Medicine

## 2016-06-07 ENCOUNTER — Ambulatory Visit (INDEPENDENT_AMBULATORY_CARE_PROVIDER_SITE_OTHER): Payer: Medicare Other | Admitting: Family Medicine

## 2016-06-07 VITALS — BP 134/70 | HR 72 | Temp 97.6°F | Resp 16 | Wt 136.0 lb

## 2016-06-07 DIAGNOSIS — R1084 Generalized abdominal pain: Secondary | ICD-10-CM

## 2016-06-07 DIAGNOSIS — K5900 Constipation, unspecified: Secondary | ICD-10-CM

## 2016-06-07 DIAGNOSIS — R197 Diarrhea, unspecified: Secondary | ICD-10-CM | POA: Diagnosis not present

## 2016-06-07 NOTE — Progress Notes (Signed)
Lynn Wall  MRN: [id number] DOB: 05-13-1927  Subjective:  HPI   The patient is an 82 year old female who presents for follow up of her abdominal discomfort.  She has been having difficulties having constipation and then diarrhea.  She states she was 4 days not being able to have a bowel movement then on Friday, Sat and Sunday she was having frequent watery stools.  She has since then not had any movements.  She complains that her cramping is bothering her quite a bit.    Patient Active Problem List   Diagnosis Date Noted  . CN (constipation) 05/23/2015  . DDD (degenerative disc disease), lumbar 05/23/2015  . Diabetes mellitus type 2 without retinopathy ([place]) 05/23/2015  . Fatigue 05/23/2015  . Acid reflux 05/23/2015  . LBP (low back pain) 05/23/2015  . Drusen of macula 05/23/2015  . Neuralgia neuritis, sciatic nerve 05/23/2015  . Basal cell papilloma 05/23/2015  . Acute urinary retention 03/28/2015  . Lumbar foraminal stenosis 03/14/2015  . Chronic cystitis 03/03/2015  . Cystocele, grade 2 02/18/2015  . History of kidney stones 02/18/2015  . Atrophic vaginitis 02/15/2015  . Recurrent UTI 02/15/2015  . Displacement of lumbar intervertebral disc without myelopathy 12/01/2014  . Bulge of lumbar disc without myelopathy 12/01/2014  . Neuritis or radiculitis due to rupture of lumbar intervertebral disc 11/10/2014  . Internal hemorrhoids without complication [id number]  . Flutter-fibrillation 03/22/2008  . Paroxysmal supraventricular tachycardia ([place]) 03/22/2008  . Colon, diverticulosis 10/28/2007  . History of colon polyps 10/28/2007  . Malignant neoplasm of skin of parts of face 10/28/2007  . Calculus of kidney 10/18/2007  . Benign essential HTN 10/18/2007  . OP (osteoporosis) 10/18/2007  . Diabetes ([place]) 10/16/2007  . Hypercholesteremia 10/16/2007    Past Medical History:  Diagnosis Date  . Acid reflux   . Allergy   . Cystitis   . DDD (degenerative disc disease),  lumbar   . Diabetes mellitus without complication ([place])   . GERD (gastroesophageal reflux disease)   . History of kidney stones   . History of renal stone   . Hyperlipidemia   . Hypertension   . Osteoporosis   . Urinary incontinence   . UTI (urinary tract infection)    currently on antibiotic    Social History   Social History  . Marital status: Married    Spouse name: N/A  . Number of children: N/A  . Years of education: N/A   Occupational History  . Not on file.   Social History Main Topics  . Smoking status: Never Smoker  . Smokeless tobacco: Never Used  . Alcohol use No  . Drug use: No  . Sexual activity: Not Currently   Other Topics Concern  . Not on file   Social History Narrative  . No narrative on file    Outpatient Encounter Prescriptions as of 06/07/2016  Medication Sig Note  . aspirin EC 81 MG tablet Take 81 mg by mouth daily.   Marland Kitchen docusate sodium (STOOL SOFTENER) 100 MG capsule Take 1 capsule (100 mg total) by mouth daily.   . enalapril (VASOTEC) 10 MG tablet 1 TABLET, ORAL, DAILY FOR BLOOD PRESSURE   . gabapentin (NEURONTIN) 100 MG capsule 2 tablets three times daily   . meloxicam (MOBIC) 15 MG tablet Take 1 tablet by mouth daily. 05/14/2016: Received from: External Pharmacy  . metFORMIN (GLUCOPHAGE) 500 MG tablet TAKE 1 TABLET BY MOUTH TWICE A DAY   . metoprolol tartrate (LOPRESSOR) 25 MG  tablet TAKE 1 TABLET TWICE A DAY   . omeprazole (PRILOSEC) 20 MG capsule Take 1 capsule (20 mg total) by mouth daily.   . ondansetron (ZOFRAN) 4 MG tablet 1/2 to 1 tablet every 6 hours as needed nausea   . polyethylene glycol powder (GLYCOLAX/MIRALAX) powder Take 17 g by mouth 2 (two) times daily as needed.   . simvastatin (ZOCOR) 40 MG tablet TAKE 1 TABLET BY MOUTH AT BEDTIME   . sulfamethoxazole-trimethoprim (BACTRIM DS,SEPTRA DS) 800-160 MG tablet Take 1 tablet by mouth 2 (two) times daily.   . traMADol (ULTRAM) 50 MG tablet 2 tablets every 6 hours as needed    No  facility-administered encounter medications on file as of 06/07/2016.     No Known Allergies  Review of Systems  Constitutional: Positive for chills, diaphoresis and malaise/fatigue. Negative for fever.  Eyes: Negative.   Respiratory: Negative.  Negative for cough, sputum production, shortness of breath and wheezing.   Cardiovascular: Negative.  Negative for chest pain, palpitations, orthopnea and leg swelling.  Gastrointestinal: Positive for abdominal pain, constipation, diarrhea and nausea. Negative for blood in stool, heartburn and vomiting.  Skin: Negative.   Neurological: Positive for weakness. Negative for dizziness and headaches.  Endo/Heme/Allergies: Negative.   Psychiatric/Behavioral: Negative.    Objective:  BP 134/70   Pulse 72   Temp 97.6 F (36.4 C) (Oral)   Resp 16   Wt 136 lb (61.7 kg)   LMP  (LMP Unknown)   BMI 24.87 kg/m   Physical Exam  Constitutional: She is oriented to person, place, and time and well-developed, well-nourished, and in no distress.  HENT:  Head: Normocephalic and atraumatic.  Right Ear: External ear normal.  Left Ear: External ear normal.  Nose: Nose normal.  Eyes: Conjunctivae are normal. No scleral icterus.  Neck: No thyromegaly present.  Cardiovascular: Normal rate, regular rhythm and normal heart sounds.   Pulmonary/Chest: Effort normal and breath sounds normal.  Abdominal: Soft.  Lymphadenopathy:    She has no cervical adenopathy.  Neurological: She is alert and oriented to person, place, and time. No cranial nerve deficit. She exhibits normal muscle tone.  Skin: Skin is warm and dry.  Psychiatric: Mood, memory, affect and judgment normal.  Bowel sounds present--?hyperactive.  Assessment and Plan :   1. Generalized abdominal pain If ultrasound is normal follow-through with CT scan of the abdomen and pelvis unless furlough is in a very short period of time.. - Ambulatory referral to Gastroenterology - US Abdomen Complete;  Future - CBC with Differential/Platelet - Comprehensive metabolic panel - Amylase - Lipase  2. Diarrhea, unspecified type Drinks city water. - Ambulatory referral to Gastroenterology - US Abdomen Complete; Future - CBC with Differential/Platelet - Comprehensive metabolic panel - Amylase - Lipase  3. Constipation, unspecified constipation type - Ambulatory referral to Gastroenterology - US Abdomen Complete; Future - CBC with Differential/Platelet - Comprehensive metabolic panel - Amylase - Lipase Stop meloxicam and metformin. I thought we had  stopped the meloxicam before this visit. 4. Shoulder arthropathy 5 spinal DDD   HPI, Exam and A&P Transcribed under the direction and in the presence of [name]., MD. Electronically Signed: Althea Charon, RMA I have done the exam and reviewed the chart and it is accurate to the best of my knowledge. Miguel Aschoff M.D. [facility]

## 2016-06-07 NOTE — Telephone Encounter (Signed)
Called Pt to schedule just AWV with [facility] anytime open for next week. - knb

## 2016-06-08 DIAGNOSIS — R1084 Generalized abdominal pain: Secondary | ICD-10-CM | POA: Diagnosis not present

## 2016-06-08 DIAGNOSIS — R197 Diarrhea, unspecified: Secondary | ICD-10-CM | POA: Diagnosis not present

## 2016-06-08 DIAGNOSIS — K5900 Constipation, unspecified: Secondary | ICD-10-CM | POA: Diagnosis not present

## 2016-06-09 LAB — COMPREHENSIVE METABOLIC PANEL
A/G RATIO: 1.9 (ref 1.2–2.2)
ALBUMIN: 4.4 g/dL (ref 3.5–4.7)
ALT: 10 IU/L (ref 0–32)
AST: 18 IU/L (ref 0–40)
Alkaline Phosphatase: 62 IU/L (ref 39–117)
BILIRUBIN TOTAL: 0.3 mg/dL (ref 0.0–1.2)
BUN / CREAT RATIO: 18 (ref 12–28)
BUN: 18 mg/dL (ref 8–27)
CHLORIDE: 97 mmol/L (ref 96–106)
CO2: 22 mmol/L (ref 18–29)
Calcium: 9.8 mg/dL (ref 8.7–10.3)
Creatinine, Ser: 0.98 mg/dL (ref 0.57–1.00)
GFR, EST AFRICAN AMERICAN: 60 mL/min/{1.73_m2} (ref >59)
GFR, EST NON AFRICAN AMERICAN: 52 mL/min/{1.73_m2} — AB (ref >59)
GLOBULIN, TOTAL: 2.3 g/dL (ref 1.5–4.5)
Glucose: 105 mg/dL — ABNORMAL HIGH (ref 65–99)
POTASSIUM: 4.9 mmol/L (ref 3.5–5.2)
Sodium: 134 mmol/L (ref 134–144)
TOTAL PROTEIN: 6.7 g/dL (ref 6.0–8.5)

## 2016-06-09 LAB — CBC WITH DIFFERENTIAL/PLATELET
BASOS: 1 %
Basophils Absolute: 0 10*3/uL (ref 0.0–0.2)
EOS (ABSOLUTE): 0.1 10*3/uL (ref 0.0–0.4)
EOS: 1 %
HEMATOCRIT: 31.1 % — AB (ref 34.0–46.6)
HEMOGLOBIN: 10.6 g/dL — AB (ref 11.1–15.9)
IMMATURE GRANS (ABS): 0 10*3/uL (ref 0.0–0.1)
Immature Granulocytes: 0 %
LYMPHS: 34 %
Lymphocytes Absolute: 2 10*3/uL (ref 0.7–3.1)
MCH: 29.6 pg (ref 26.6–33.0)
MCHC: 34.1 g/dL (ref 31.5–35.7)
MCV: 87 fL (ref 79–97)
MONOCYTES: 8 %
Monocytes Absolute: 0.5 10*3/uL (ref 0.1–0.9)
NEUTROS ABS: 3.3 10*3/uL (ref 1.4–7.0)
Neutrophils: 56 %
Platelets: 217 10*3/uL (ref 150–379)
RBC: 3.58 x10E6/uL — ABNORMAL LOW (ref 3.77–5.28)
RDW: 14.8 % (ref 12.3–15.4)
WBC: 5.8 10*3/uL (ref 3.4–10.8)

## 2016-06-09 LAB — LIPASE: Lipase: 64 U/L (ref 14–85)

## 2016-06-09 LAB — AMYLASE: Amylase: 78 U/L (ref 31–124)

## 2016-06-12 ENCOUNTER — Ambulatory Visit
Admission: RE | Admit: 2016-06-12 | Discharge: 2016-06-12 | Disposition: A | Discharge status: Home / Self Care | Payer: Medicare Other | Source: Ambulatory Visit | Attending: Family Medicine | Admitting: Family Medicine

## 2016-06-12 DIAGNOSIS — N200 Calculus of kidney: Secondary | ICD-10-CM | POA: Diagnosis not present

## 2016-06-12 DIAGNOSIS — K5900 Constipation, unspecified: Secondary | ICD-10-CM

## 2016-06-12 DIAGNOSIS — R1084 Generalized abdominal pain: Secondary | ICD-10-CM | POA: Diagnosis present

## 2016-06-12 DIAGNOSIS — K8020 Calculus of gallbladder without cholecystitis without obstruction: Secondary | ICD-10-CM | POA: Diagnosis not present

## 2016-06-12 DIAGNOSIS — R197 Diarrhea, unspecified: Secondary | ICD-10-CM

## 2016-07-02 ENCOUNTER — Ambulatory Visit (INDEPENDENT_AMBULATORY_CARE_PROVIDER_SITE_OTHER): Payer: Medicare Other | Admitting: Family Medicine

## 2016-07-02 ENCOUNTER — Encounter: Payer: Self-pay | Admitting: Family Medicine

## 2016-07-02 VITALS — BP 138/80 | HR 76 | Temp 97.8°F | Resp 16 | Wt 139.0 lb

## 2016-07-02 DIAGNOSIS — K6289 Other specified diseases of anus and rectum: Secondary | ICD-10-CM | POA: Diagnosis not present

## 2016-07-02 MED ORDER — HYDROCORTISONE ACETATE 25 MG RE SUPP: 25.0000 mg | Freq: Every day | RECTAL | 5 refills | Status: DC

## 2016-07-02 NOTE — Progress Notes (Signed)
Subjective:  HPI Pt is here for anal burning. She reports that she has had a lot of diarrhea last time she was here and was treated with bactrim. She took all of that and for about 2 weeks now she has been having anal burning. She reports that it worse with a bowel movement but burns all the time. She reports that she does not have diarrhea anymore nor is she constipated, she has loose stools and it is daily. No blood in stools. She denies any dysuria, or vaginal itching. She has a h/o hemorrhoids. She is to GI at the end of the month, we referred her at last OV. She is not sleeping because of this as well.  In general she is feeling better than she was over the summer. Abdominal pain that she was having past few months is essentially resolved and this replaced by the perianal burning.  Prior to Admission medications   Medication Sig Start Date End Date Taking? Authorizing Provider  aspirin EC 81 MG tablet Take 81 mg by mouth daily.   Yes Historical Provider, MD  docusate sodium (STOOL SOFTENER) 100 MG capsule Take 1 capsule (100 mg total) by mouth daily. 05/14/16  Yes Shianne Zeiser Maceo Pro., MD  enalapril (VASOTEC) 10 MG tablet 1 TABLET, ORAL, DAILY FOR BLOOD PRESSURE 05/23/15  Yes Jerrol Banana., MD  metoprolol tartrate (LOPRESSOR) 25 MG tablet TAKE 1 TABLET TWICE A DAY 10/31/15  Yes Ziad Maye Maceo Pro., MD  omeprazole (PRILOSEC) 20 MG capsule Take 1 capsule (20 mg total) by mouth daily. 03/23/16  Yes Torsha Lemus Maceo Pro., MD  ondansetron [facility]) 4 MG tablet 1/2 to 1 tablet every 6 hours as needed nausea 03/23/16  Yes Dayan Kreis Maceo Pro., MD  polyethylene glycol powder (GLYCOLAX/MIRALAX) powder Take 17 g by mouth 2 (two) times daily as needed. 05/14/16  Yes Jaree Dwight Maceo Pro., MD  simvastatin (ZOCOR) 40 MG tablet TAKE 1 TABLET BY MOUTH AT BEDTIME 05/18/16  Yes Dietrick Barris Maceo Pro., MD  traMADol Veatrice [place]) 50 MG tablet 2 tablets every 6 hours as needed 03/08/16  Yes Sharrod Achille Maceo Pro.,  MD  gabapentin (NEURONTIN) 100 MG capsule 2 tablets three times daily Patient not taking: Reported on 07/02/2016 11/14/15   Jerrol Banana., MD  meloxicam (MOBIC) 15 MG tablet Take 1 tablet by mouth daily. 04/20/16   Historical Provider, MD  metFORMIN (GLUCOPHAGE) 500 MG tablet  05/29/16   Historical Provider, MD    Patient Active Problem List   Diagnosis Date Noted  . CN (constipation) 05/23/2015  . DDD (degenerative disc disease), lumbar 05/23/2015  . Diabetes mellitus type 2 without retinopathy ([place]) 05/23/2015  . Fatigue 05/23/2015  . Acid reflux 05/23/2015  . LBP (low back pain) 05/23/2015  . Drusen of macula 05/23/2015  . Neuralgia neuritis, sciatic nerve 05/23/2015  . Basal cell papilloma 05/23/2015  . Acute urinary retention 03/28/2015  . Lumbar foraminal stenosis 03/14/2015  . Chronic cystitis 03/03/2015  . Cystocele, grade 2 02/18/2015  . History of kidney stones 02/18/2015  . Atrophic vaginitis 02/15/2015  . Recurrent UTI 02/15/2015  . Displacement of lumbar intervertebral disc without myelopathy 12/01/2014  . Bulge of lumbar disc without myelopathy 12/01/2014  . Neuritis or radiculitis due to rupture of lumbar intervertebral disc 11/10/2014  . Internal hemorrhoids without complication [id number]  . Flutter-fibrillation 03/22/2008  . Paroxysmal supraventricular tachycardia ([place]) 03/22/2008  . Colon, diverticulosis 10/28/2007  . History of colon polyps 10/28/2007  .  Malignant neoplasm of skin of parts of face 10/28/2007  . Calculus of kidney 10/18/2007  . Benign essential HTN 10/18/2007  . OP (osteoporosis) 10/18/2007  . Diabetes ([place]) 10/16/2007  . Hypercholesteremia 10/16/2007    Past Medical History:  Diagnosis Date  . Acid reflux   . Allergy   . Cystitis   . DDD (degenerative disc disease), lumbar   . Diabetes mellitus without complication ([place])   . GERD (gastroesophageal reflux disease)   . History of kidney stones   . History of renal stone   .  Hyperlipidemia   . Hypertension   . Osteoporosis   . Urinary incontinence   . UTI (urinary tract infection)    currently on antibiotic    Social History   Social History  . Marital status: Married    Spouse name: N/A  . Number of children: N/A  . Years of education: N/A   Occupational History  . Not on file.   Social History Main Topics  . Smoking status: Never Smoker  . Smokeless tobacco: Never Used  . Alcohol use No  . Drug use: No  . Sexual activity: Not Currently   Other Topics Concern  . Not on file   Social History Narrative  . No narrative on file    No Known Allergies  Review of Systems  Constitutional: Negative.   HENT: Negative.   Eyes: Negative.   Respiratory: Negative.   Cardiovascular: Negative.   Gastrointestinal: Positive for abdominal pain.  Genitourinary: Negative.   Musculoskeletal: Negative.   Skin: Negative.   Neurological: Negative.   Endo/Heme/Allergies: Negative.   Psychiatric/Behavioral: Negative.     Immunization History  Administered Date(s) Administered  . Influenza, High Dose Seasonal PF 05/23/2015  . Tdap 03/27/2012    Objective:  BP 138/80 (BP Location: Left Arm, Patient Position: Sitting, Cuff Size: Normal)   Pulse 76   Temp 97.8 F (36.6 C) (Oral)   Resp 16   Wt 139 lb (63 kg)   LMP  (LMP Unknown)   BMI 25.42 kg/m   Physical Exam  Constitutional: She is oriented to person, place, and time and well-developed, well-nourished, and in no distress.  Well-nourished well-developed white female who appears younger than her age. In general she appears much more comfortable than she did a month ago.  HENT:  Head: Normocephalic and atraumatic.  Right Ear: External ear normal.  Left Ear: External ear normal.  Nose: Nose normal.  Eyes: Conjunctivae are normal. No scleral icterus.  Neck: Neck supple. No thyromegaly present.  Cardiovascular: Normal rate, regular rhythm and normal heart sounds.   Pulmonary/Chest: Effort  normal and breath sounds normal.  Abdominal: Soft.  Genitourinary:  Genitourinary Comments: The perianal area is visualized and appears to be erythematous with a small area that appears to be slightly indurated without focal tenderness or any abscess formation. I do not see an obvious hemorrhoid present. Anoscopy is not performed.  Lymphadenopathy:    She has no cervical adenopathy.  Neurological: She is alert and oriented to person, place, and time. Gait normal. GCS score is 15.  Skin: Skin is warm and dry.  Psychiatric: Mood, memory, affect and judgment normal.    Lab Results  Component Value Date   WBC 5.8 06/08/2016   HGB 12.0 03/07/2015   HCT 31.1 (L) 06/08/2016   PLT 217 06/08/2016   GLUCOSE 105 (H) 06/08/2016   CHOL 136 03/12/2016   TRIG 132 03/12/2016   HDL 47 03/12/2016   LDLCALC 63  03/12/2016   TSH 1.100 03/12/2016   HGBA1C 6.7 (H) 03/12/2016    CMP     Component Value Date/Time   NA 134 06/08/2016 1523   K 4.9 06/08/2016 1523   CL 97 06/08/2016 1523   CO2 22 06/08/2016 1523   GLUCOSE 105 (H) 06/08/2016 1523   GLUCOSE 123 (H) 03/07/2015 1226   BUN 18 06/08/2016 1523   CREATININE 0.98 06/08/2016 1523   CALCIUM 9.8 06/08/2016 1523   PROT 6.7 06/08/2016 1523   ALBUMIN 4.4 06/08/2016 1523   AST 18 06/08/2016 1523   ALT 10 06/08/2016 1523   ALKPHOS 62 06/08/2016 1523   BILITOT 0.3 06/08/2016 1523   GFRNONAA 52 (L) 06/08/2016 1523   GFRAA 60 06/08/2016 1523    Assessment and Plan :  1. Anal pain Certainly there appears to be no obvious perirectal or perianal abscess. No thrombosed hemorrhoid noted. 2. Proctitis- try to get appt sooner with GI.  - hydrocortisone (ANUSOL-HC) 25 MG suppository; Place 1 suppository (25 mg total) rectally at bedtime.  Dispense: 12 suppository; Refill: 5 - Ambulatory referral to Gastroenterology 3.DDD/OA Overall pain is much improved since she's had shoulder injection for rotator cuff arthropathies 4. Type 2  diabetes Well-controlled with last A1c being 6.7 5. History of SVT 6. Chronic constipation 7. GERD HPI, Exam, and A&P Transcribed under the direction and in the presence of Mat Stuard L. Cranford Mon, MD  Electronically Signed: Webb Laws, CMA I have done the exam and reviewed the above chart and it is accurate to the best of my knowledge.  Miguel Aschoff MD [facility] 07/02/2016 4:08 PM

## 2016-07-11 ENCOUNTER — Ambulatory Visit (INDEPENDENT_AMBULATORY_CARE_PROVIDER_SITE_OTHER): Payer: Medicare Other | Admitting: Gastroenterology

## 2016-07-11 ENCOUNTER — Encounter: Payer: Self-pay | Admitting: Gastroenterology

## 2016-07-11 VITALS — BP 114/65 | HR 72 | Temp 97.4°F | Ht 63.0 in | Wt 140.0 lb

## 2016-07-11 DIAGNOSIS — K581 Irritable bowel syndrome with constipation: Secondary | ICD-10-CM

## 2016-07-11 MED ORDER — POLYETHYLENE GLYCOL 3350 17 G PO PACK: 17.0000 g | PACK | Freq: Every day | ORAL | 0 refills | Status: DC

## 2016-07-11 MED ORDER — MAGNESIUM CITRATE PO SOLN: 1.0000 | Freq: Once | ORAL | 0 refills | Status: AC

## 2016-07-11 NOTE — Patient Instructions (Addendum)
High-Fiber Diet Fiber, also called dietary fiber, is a type of carbohydrate found in fruits, vegetables, whole grains, and beans. A high-fiber diet can have many health benefits. Your health care provider may recommend a high-fiber diet to help:  Prevent constipation. Fiber can make your bowel movements more regular.  Lower your cholesterol.  Relieve hemorrhoids, uncomplicated diverticulosis, or irritable bowel syndrome.  Prevent overeating as part of a weight-loss plan.  Prevent heart disease, type 2 diabetes, and certain cancers. What is my plan? The recommended daily intake of fiber includes:  38 grams for men under age 66.  42 grams for men over age 84.  36 grams for women under age 46.  15 grams for women over age 15. You can get the recommended daily intake of dietary fiber by eating a variety of fruits, vegetables, grains, and beans. Your health care provider may also recommend a fiber supplement if it is not possible to get enough fiber through your diet. What do I need to know about a high-fiber diet?  Fiber supplements have not been widely studied for their effectiveness, so it is better to get fiber through food sources.  Always check the fiber content on thenutrition facts label of any prepackaged food. Look for foods that contain at least 5 grams of fiber per serving.  Ask your dietitian if you have questions about specific foods that are related to your condition, especially if those foods are not listed in the following section.  Increase your daily fiber consumption gradually. Increasing your intake of dietary fiber too quickly may cause bloating, cramping, or gas.  Drink plenty of water. Water helps you to digest fiber. What foods can I eat? Grains  Whole-grain breads. Multigrain cereal. Oats and oatmeal. Brown rice. Barley. Bulgur wheat. [place]. Bran muffins. Popcorn. Rye wafer crackers. Vegetables  Sweet potatoes. Spinach. Kale. Artichokes. Cabbage. Broccoli.  Green peas. Carrots. Squash. Fruits  Berries. Pears. Apples. Oranges. Avocados. Prunes and raisins. Dried figs. Meats and Other Protein Sources  Navy, kidney, pinto, and soy beans. Split peas. Lentils. Nuts and seeds. Dairy  Fiber-fortified yogurt. Beverages  Fiber-fortified soy milk. Fiber-fortified orange juice. Other  Fiber bars. The items listed above may not be a complete list of recommended foods or beverages. Contact your dietitian for more options.  What foods are not recommended? Grains  White bread. Pasta made with refined flour. White rice. Vegetables  Fried potatoes. Canned vegetables. Well-cooked vegetables. Fruits  Fruit juice. Cooked, strained fruit. Meats and Other Protein Sources  Fatty cuts of meat. Fried Sales executive or fried fish. Dairy  Milk. Yogurt. Cream cheese. Sour cream. Beverages  Soft drinks. Other  Cakes and pastries. Butter and oils. The items listed above may not be a complete list of foods and beverages to avoid. Contact your dietitian for more information.  What are some tips for including high-fiber foods in my diet?  Eat a wide variety of high-fiber foods.  Make sure that half of all grains consumed each day are whole grains.  Replace breads and cereals made from refined flour or white flour with whole-grain breads and cereals.  Replace white rice with brown rice, bulgur wheat, or millet.  Start the day with a breakfast that is high in fiber, such as a cereal that contains at least 5 grams of fiber per serving.  Use beans in place of meat in soups, salads, or pasta.  Eat high-fiber snacks, such as berries, raw vegetables, nuts, or popcorn. This information is not intended to replace  advice given to you by your health care provider. Make sure you discuss any questions you have with your health care provider. Document Released: 08/13/2005 Document Revised: 01/19/2016 Document Reviewed: 01/26/2014 Elsevier Interactive Patient Education  2017  [employer].  High-Fiber Diet Fiber, also called dietary fiber, is a type of carbohydrate found in fruits, vegetables, whole grains, and beans. A high-fiber diet can have many health benefits. Your health care provider may recommend a high-fiber diet to help:  Prevent constipation. Fiber can make your bowel movements more regular.  Lower your cholesterol.  Relieve hemorrhoids, uncomplicated diverticulosis, or irritable bowel syndrome.  Prevent overeating as part of a weight-loss plan.  Prevent heart disease, type 2 diabetes, and certain cancers. What is my plan? The recommended daily intake of fiber includes:  38 grams for men under age 27.  22 grams for men over age 81.  49 grams for women under age 20.  48 grams for women over age 82. You can get the recommended daily intake of dietary fiber by eating a variety of fruits, vegetables, grains, and beans. Your health care provider may also recommend a fiber supplement if it is not possible to get enough fiber through your diet. What do I need to know about a high-fiber diet?  Fiber supplements have not been widely studied for their effectiveness, so it is better to get fiber through food sources.  Always check the fiber content on thenutrition facts label of any prepackaged food. Look for foods that contain at least 5 grams of fiber per serving.  Ask your dietitian if you have questions about specific foods that are related to your condition, especially if those foods are not listed in the following section.  Increase your daily fiber consumption gradually. Increasing your intake of dietary fiber too quickly may cause bloating, cramping, or gas.  Drink plenty of water. Water helps you to digest fiber. What foods can I eat? Grains  Whole-grain breads. Multigrain cereal. Oats and oatmeal. Brown rice. Barley. Bulgur wheat. [place]. Bran muffins. Popcorn. Rye wafer crackers. Vegetables  Sweet potatoes. Spinach. Kale. Artichokes.  Cabbage. Broccoli. Green peas. Carrots. Squash. Fruits  Berries. Pears. Apples. Oranges. Avocados. Prunes and raisins. Dried figs. Meats and Other Protein Sources  Navy, kidney, pinto, and soy beans. Split peas. Lentils. Nuts and seeds. Dairy  Fiber-fortified yogurt. Beverages  Fiber-fortified soy milk. Fiber-fortified orange juice. Other  Fiber bars. The items listed above may not be a complete list of recommended foods or beverages. Contact your dietitian for more options.  What foods are not recommended? Grains  White bread. Pasta made with refined flour. White rice. Vegetables  Fried potatoes. Canned vegetables. Well-cooked vegetables. Fruits  Fruit juice. Cooked, strained fruit. Meats and Other Protein Sources  Fatty cuts of meat. Fried Sales executive or fried fish. Dairy  Milk. Yogurt. Cream cheese. Sour cream. Beverages  Soft drinks. Other  Cakes and pastries. Butter and oils. The items listed above may not be a complete list of foods and beverages to avoid. Contact your dietitian for more information.  What are some tips for including high-fiber foods in my diet?  Eat a wide variety of high-fiber foods.  Make sure that half of all grains consumed each day are whole grains.  Replace breads and cereals made from refined flour or white flour with whole-grain breads and cereals.  Replace white rice with brown rice, bulgur wheat, or millet.  Start the day with a breakfast that is high in fiber, such as a cereal  that contains at least 5 grams of fiber per serving.  Use beans in place of meat in soups, salads, or pasta.  Eat high-fiber snacks, such as berries, raw vegetables, nuts, or popcorn. This information is not intended to replace advice given to you by your health care provider. Make sure you discuss any questions you have with your health care provider. Document Released: 08/13/2005 Document Revised: 01/19/2016 Document Reviewed: 01/26/2014 Elsevier Interactive Patient  Education  2017 [employer].

## 2016-07-11 NOTE — Progress Notes (Signed)
Gastroenterology Consultation  Referring Provider:     Jerrol Banana.,* Primary Care Physician:  [name], MD Primary Gastroenterologist:  Dr. Jonathon Bellows  Reason for Consultation:     Abdominal pain, diarrhea,constipation         HPI:   Lynn Wall is a 82 y.o. y/o female referred for consultation & management  by Dr. [name], MD.    She was seen by Dr Rosanna Randy for abdominal pain , constipation, diarrhea. She was on meloxicam , advised to stop. Labs 06/08/16 show a normal BMP,LFT.  Hb was 10.6 , normal MCV Hb 11.5:  02/2016 Hb 11.1  USG abdomen 05/2016 : multiple gall stones. 9 mm non obstructing kidney stone. Complex cyst in the rt kidney.   Abdominal pain Onset - 4-5 months back began , gradually worsened. On and off, Gets pain when she takes miralax. Lasts till she has a bowel movement .  Site :lower abdomen  Radiation: localized Severity : 5/10 Nature of pain: squeezing Aggravating factors: miralax,  Relieving factors :bowel movement  Weight loss: 4 lbs up and down . Lost 5 lbs over 6 months  NSAID use: Still using Meloxicam  PPI use :used to stop Prilosec but recently stopped due to concerns of long term use Gall bladder surgery: intact Frequency of bowel movements: one every few days, very hard  Change in bowel movements: Ongoing for a year, no blood in stool. Round and hard like marbles Relief with bowel movements: yes Gas/Bloating/Abdominal distension: yes when she does not have a bowel movement  Denies need for manual evacuation . She recalls some for of pelvic floor repair in the past .  Last colonoscopy in 2010 - She recalls she had polyps. No family history of colon cancer or polyps .  She has tried Miralax as needed . When she takes it works very little. Had to try magnesium citrate  Which worked very well and she felt great after that for a few days. She uses tramadol 1 in 14 days.      Past Medical History:  Diagnosis  Date  . Acid reflux   . Allergy   . Cystitis   . DDD (degenerative disc disease), lumbar   . Diabetes mellitus without complication ([place])   . GERD (gastroesophageal reflux disease)   . History of kidney stones   . History of renal stone   . Hyperlipidemia   . Hypertension   . Osteoporosis   . Urinary incontinence   . UTI (urinary tract infection)    currently on antibiotic    Past Surgical History:  Procedure Laterality Date  . ABDOMINAL HYSTERECTOMY    . CATARACT EXTRACTION, BILATERAL    . HEMORRHOID SURGERY    . LUMBAR LAMINECTOMY/DECOMPRESSION MICRODISCECTOMY Left 03/14/2015   Procedure: LUMBAR LAMINECTOMY/DECOMPRESSION MICRODISCECTOMY 1 LEVEL;  Surgeon: Newman Pies, MD;  Location: [facility];  Service: Neurosurgery;  Laterality: Left;  Left L34 microdiskectomy    Prior to Admission medications   Medication Sig Start Date End Date Taking? Authorizing Provider  aspirin EC 81 MG tablet Take 81 mg by mouth daily.    Historical Provider, MD  docusate sodium (STOOL SOFTENER) 100 MG capsule Take 1 capsule (100 mg total) by mouth daily. 05/14/16   Richard Maceo Pro., MD  enalapril (VASOTEC) 10 MG tablet 1 TABLET, ORAL, DAILY FOR BLOOD PRESSURE 05/23/15   Jerrol Banana., MD  gabapentin (NEURONTIN) 100 MG capsule 2 tablets  three times daily Patient not taking: Reported on 07/02/2016 11/14/15   Jerrol Banana., MD  hydrocortisone (ANUSOL-HC) 25 MG suppository Place 1 suppository (25 mg total) rectally at bedtime. 07/02/16   Richard Maceo Pro., MD  meloxicam (MOBIC) 15 MG tablet Take 1 tablet by mouth daily. 04/20/16   Historical Provider, MD  metFORMIN (GLUCOPHAGE) 500 MG tablet  05/29/16   Historical Provider, MD  metoprolol tartrate (LOPRESSOR) 25 MG tablet TAKE 1 TABLET TWICE A DAY 10/31/15   Richard Maceo Pro., MD  omeprazole (PRILOSEC) 20 MG capsule Take 1 capsule (20 mg total) by mouth daily. 03/23/16   Richard Maceo Pro., MD  ondansetron [facility]) 4 MG tablet  1/2 to 1 tablet every 6 hours as needed nausea 03/23/16   Jerrol Banana., MD  polyethylene glycol powder (GLYCOLAX/MIRALAX) powder Take 17 g by mouth 2 (two) times daily as needed. 05/14/16   Richard Maceo Pro., MD  simvastatin (ZOCOR) 40 MG tablet TAKE 1 TABLET BY MOUTH AT BEDTIME 05/18/16   Jerrol Banana., MD  traMADol Veatrice Bourbon) 50 MG tablet 2 tablets every 6 hours as needed 03/08/16   Jerrol Banana., MD    Family History  Problem Relation Age of Onset  . Cancer Father   . Kidney disease Neg Hx   . Prostate cancer Neg Hx   . Bladder Cancer Neg Hx      Social History  Substance Use Topics  . Smoking status: Never Smoker  . Smokeless tobacco: Never Used  . Alcohol use No    Allergies as of 07/11/2016  . (No Known Allergies)    Review of Systems:    All systems reviewed and negative except where noted in HPI.   Physical Exam:  LMP  (LMP Unknown)  No LMP recorded (lmp unknown). Patient is postmenopausal. Psych:  Alert and cooperative. Normal mood and affect. General:   Alert,  Well-developed, well-nourished, pleasant and cooperative in NAD Head:  Normocephalic and atraumatic. Eyes:  Sclera clear, no icterus.   Conjunctiva pink. Ears:  Normal auditory acuity. Nose:  No deformity, discharge, or lesions. Mouth:  No deformity or lesions,oropharynx pink & moist. Neck:  Supple; no masses or thyromegaly. Lungs:  Respirations even and unlabored.  Clear throughout to auscultation.   No wheezes, crackles, or rhonchi. No acute distress. Heart:  Regular rate and rhythm; no murmurs, clicks, rubs, or gallops. Abdomen:  Normal bowel sounds.  No bruits.  Soft, non-tender and non-distended without masses, hepatosplenomegaly or hernias noted.  No guarding or rebound tenderness.    Msk:  Symmetrical without gross deformities. Good, equal movement & strength bilaterally. Pulses:  Normal pulses noted. Extremities:  No clubbing or edema.  No cyanosis. Neurologic:  Alert and  oriented x3;  grossly normal neurologically. Skin:  Intact without significant lesions or rashes. No jaundice. Lymph Nodes:  No significant cervical adenopathy. Psych:  Alert and cooperative. Normal mood and affect.  Imaging Studies: US Abdomen Complete  Result Date: 06/12/2016 CLINICAL DATA:  Abdominal pain. EXAM: ABDOMEN ULTRASOUND COMPLETE COMPARISON:  Abdominal series 05/28/2016. MRI 07/28/2015. Ultrasound 01/14/2008. FINDINGS: Gallbladder: Multiple gallstones. Gallbladder wall thickness normal. Negative Murphy sign. No pericholecystic fluid collection. Common bile duct: Diameter: 2.8 mm Liver: Hepatic echotexture is normal. 4.1 x 3.0 x 4.5 cm septated cyst is in the right hepatic lobe. This is most likely benign. IVC: No abnormality visualized. Pancreas: Visualized portion unremarkable. Spleen: Size and appearance within normal limits. Right Kidney: Length: 9.3  cm. Echogenicity within normal limits. No mass or hydronephrosis visualized. 9 mm nonobstructing stone. Left Kidney: Length: 9.2 cm. Echogenicity within normal limits. No hydronephrosis visualized. 8.4 x 5.4 x 8.4 cm complex cyst with thin septations. Similar findings noted on prior exam. Abdominal aorta: No aneurysm visualized. Other findings: None. IMPRESSION: 1. Multiple gallstones. No evidence of cholecystitis or biliary distention. 2. 9 mm nonobstructing stone right kidney. Complex cyst with thin septations again noted in the left kidney without significant change from prior exam. Although there has been slight interval growth from 2009 this is most likely benign . Electronically Signed   By: Marcello Moores  Register   On: 06/12/2016 11:00    Assessment and Plan:   Lynn Wall is a 82 y.o. y/o female has been referred for abdominal pain and constipation. She very likely has IBS-C or chronic constipation . It is very likely she has a component of constipation related to tramadol use as well as the cystocele.   Explained we will clean  her out today with magnesium citrate and then commence on daily miralax. If no better will consider Amitiza or linzess. We did talk briefly regarding a flexible sigmoidoscopy/colonoscopy to rule out a colonic lesion , she said we will discuss further at next visit in 2 weeks.   Follow up in 2 weeks  Dr Jonathon Bellows MD

## 2016-07-13 ENCOUNTER — Telehealth: Payer: Self-pay | Admitting: Family Medicine

## 2016-07-29 ENCOUNTER — Other Ambulatory Visit: Payer: Self-pay | Admitting: Family Medicine

## 2016-07-31 ENCOUNTER — Telehealth: Payer: Self-pay

## 2016-07-31 ENCOUNTER — Ambulatory Visit
Admission: RE | Admit: 2016-07-31 | Discharge: 2016-07-31 | Disposition: A | Discharge status: Home / Self Care | Payer: Medicare Other | Source: Ambulatory Visit | Attending: Gastroenterology | Admitting: Gastroenterology

## 2016-07-31 ENCOUNTER — Other Ambulatory Visit: Payer: Self-pay

## 2016-07-31 ENCOUNTER — Encounter: Payer: Self-pay | Admitting: Gastroenterology

## 2016-07-31 ENCOUNTER — Ambulatory Visit (INDEPENDENT_AMBULATORY_CARE_PROVIDER_SITE_OTHER): Payer: Medicare Other | Admitting: Gastroenterology

## 2016-07-31 VITALS — BP 155/76 | HR 65 | Temp 98.0°F | Ht 63.0 in | Wt 140.0 lb

## 2016-07-31 DIAGNOSIS — K5900 Constipation, unspecified: Secondary | ICD-10-CM | POA: Insufficient documentation

## 2016-07-31 DIAGNOSIS — K582 Mixed irritable bowel syndrome: Secondary | ICD-10-CM

## 2016-07-31 DIAGNOSIS — N200 Calculus of kidney: Secondary | ICD-10-CM | POA: Diagnosis not present

## 2016-07-31 DIAGNOSIS — K8020 Calculus of gallbladder without cholecystitis without obstruction: Secondary | ICD-10-CM | POA: Diagnosis not present

## 2016-07-31 MED ORDER — LINACLOTIDE 145 MCG PO CAPS: 145.0000 ug | ORAL_CAPSULE | Freq: Every day | ORAL | 5 refills | Status: DC

## 2016-07-31 NOTE — Telephone Encounter (Signed)
Spoke with pt's daughter, [name] and advised of xray results. Rx for Linzess 14mcg sent to [facility], [place].

## 2016-07-31 NOTE — Telephone Encounter (Signed)
-----   Message from Jonathon Bellows, MD sent at 07/31/2016  3:52 PM EST ----- I saw this patient today , she complained of diarrhea, I suspected she has constipation with overflow diarrhea. The xray confirms that she is constipated. Inform her of the same and I would suggest linzess 145 mcg once daily and if no better in 10 days to increase to 290 mcg .

## 2016-07-31 NOTE — Patient Instructions (Signed)
Please refer below for your follow up appointment.   Please head over to the medical mall, registration desk to get registered for your x-ray.  Please call us if you have any questions or concerns

## 2016-07-31 NOTE — Progress Notes (Signed)
Primary Care Physician: [name], MD  Primary Gastroenterologist:  Dr. Jonathon Bellows   Chief Complaint  Patient presents with  . Follow-up    Proctitis     HPI: ROSITA BABAUTA is a 82 y.o. female here for follow up for IBS-C. She was last seen on 07/11/2016 . She has been on meloxicam ,h/o pelvic floor repair , on tramdol occasionally .   Last time I cleaned her out with magnesium citrate , which she says it worked, then started [employer], which did not work. Says she has been passing only liquid. Presently she says she has "diarrhea" , not been on miralax for about atleast 5 days.   Current Outpatient Prescriptions  Medication Sig Dispense Refill  . aspirin EC 81 MG tablet Take 81 mg by mouth daily.    Marland Kitchen docusate sodium (STOOL SOFTENER) 100 MG capsule Take 1 capsule (100 mg total) by mouth daily. 10 capsule 0  . enalapril (VASOTEC) 10 MG tablet 1 TABLET, ORAL, DAILY FOR BLOOD PRESSURE 90 tablet 4  . meloxicam (MOBIC) 15 MG tablet Take 1 tablet by mouth daily.    . metoprolol tartrate (LOPRESSOR) 25 MG tablet TAKE 1 TABLET TWICE A DAY 180 tablet 3  . omeprazole (PRILOSEC) 20 MG capsule Take 1 capsule (20 mg total) by mouth daily. 30 capsule 3  . ondansetron (ZOFRAN) 4 MG tablet 1/2 to 1 tablet every 6 hours as needed nausea 100 tablet 2  . polyethylene glycol (MIRALAX / GLYCOLAX) packet Take 17 g by mouth daily. 14 each 0  . polyethylene glycol powder (GLYCOLAX/MIRALAX) powder Take 17 g by mouth 2 (two) times daily as needed. 3350 g 1  . simvastatin (ZOCOR) 40 MG tablet TAKE 1 TABLET BY MOUTH AT BEDTIME 90 tablet 3  . traMADol (ULTRAM) 50 MG tablet 2 tablets every 6 hours as needed 100 tablet 5   No current facility-administered medications for this visit.     Allergies as of 07/31/2016  . (No Known Allergies)    ROS:  General: Negative for anorexia, weight loss, fever, chills, fatigue, weakness. ENT: Negative for hoarseness, difficulty swallowing , nasal  congestion. CV: Negative for chest pain, angina, palpitations, dyspnea on exertion, peripheral edema.  Respiratory: Negative for dyspnea at rest, dyspnea on exertion, cough, sputum, wheezing.  GI: See history of present illness. GU:  Negative for dysuria, hematuria, urinary incontinence, urinary frequency, nocturnal urination.  Endo: Negative for unusual weight change.    Physical Examination:   BP (!) 155/76   Pulse 65   Temp 98 F (36.7 C) (Oral)   Ht 5\' 3"  (1.6 m)   Wt 140 lb (63.5 kg)   LMP  (LMP Unknown)   BMI 24.80 kg/m   General: Well-nourished, well-developed in no acute distress.  Eyes: No icterus. Conjunctivae pink. Mouth: Oropharyngeal mucosa moist and pink , no lesions erythema or exudate. Extremities: No lower extremity edema. No clubbing or deformities. Neuro: Alert and oriented x 3.  Grossly intact.  Psych: Alert and cooperative, normal mood and affect.  Imaging Studies: No results found.  Assessment and Plan:   JAREN LOWENTHAL is a 82 y.o. y/o female here for follow up for  IBS-C or chronic constipation . It is very likely she has a component of constipation related to tramadol use as well as the cystocele. Presently she says she has been having diarrhea after a period of 7 days when she had no bowel movements suggesting constipation with overflow diarrhea.  Plan : 1. Flat plate x ray - if shows constipation then will start on linzess If no constipation will check stool for c diff and PCR +/- colonoscopy.   Dr Jonathon Bellows  MD Follow up in 1 week

## 2016-08-06 ENCOUNTER — Ambulatory Visit (INDEPENDENT_AMBULATORY_CARE_PROVIDER_SITE_OTHER): Payer: Medicare Other | Admitting: Family Medicine

## 2016-08-06 DIAGNOSIS — Z23 Encounter for immunization: Secondary | ICD-10-CM | POA: Diagnosis not present

## 2016-08-06 DIAGNOSIS — Z0000 Encounter for general adult medical examination without abnormal findings: Secondary | ICD-10-CM

## 2016-08-07 ENCOUNTER — Ambulatory Visit: Payer: Medicare Other | Admitting: Gastroenterology

## 2016-08-08 NOTE — Progress Notes (Signed)
Flu shot today 

## 2016-08-21 ENCOUNTER — Other Ambulatory Visit: Payer: Self-pay

## 2016-08-22 ENCOUNTER — Ambulatory Visit (INDEPENDENT_AMBULATORY_CARE_PROVIDER_SITE_OTHER): Payer: Medicare Other | Admitting: Gastroenterology

## 2016-08-22 ENCOUNTER — Encounter: Payer: Self-pay | Admitting: Gastroenterology

## 2016-08-22 VITALS — BP 153/76 | HR 64 | Temp 97.6°F | Ht 63.0 in | Wt 141.0 lb

## 2016-08-22 DIAGNOSIS — K5900 Constipation, unspecified: Secondary | ICD-10-CM | POA: Diagnosis not present

## 2016-08-22 NOTE — Progress Notes (Signed)
Primary Care Physician: [name], MD  Primary Gastroenterologist:  Dr. Jonathon Bellows   Chief Complaint  Patient presents with  . Follow up IBS    HPI: Lynn Wall is a 82 y.o. female here to follow up for IBS-C. She/He was last seen on 07/31/16 .She has been on meloxicam ,h/o pelvic floor repair , on tramdol occasionally .   At her last visit she complained of diarrhea and I suspected constipation with overflow diarrhea. A flat plate x ray confirmed constipation and I commenced her on linzess 145 mcg, she increased it to 290 mcg, passing small qty of stool daily , prior to which was going once a week. She feels she is comfortable at this point and is much improved.    Current Outpatient Prescriptions  Medication Sig Dispense Refill  . aspirin EC 81 MG tablet Take 81 mg by mouth daily.    Marland Kitchen docusate sodium (STOOL SOFTENER) 100 MG capsule Take 1 capsule (100 mg total) by mouth daily. 10 capsule 0  . enalapril (VASOTEC) 10 MG tablet 1 TABLET, ORAL, DAILY FOR BLOOD PRESSURE 90 tablet 4  . hydrocortisone (ANUSOL-HC) 25 MG suppository PLACE 1 SUPPOSITORY (25 MG TOTAL) RECTALLY AT BEDTIME.  5  . linaclotide (LINZESS) 145 MCG CAPS capsule Take 1 capsule (145 mcg total) by mouth daily before breakfast. 30 capsule 5  . meloxicam (MOBIC) 15 MG tablet Take 1 tablet by mouth daily.    . metFORMIN (GLUCOPHAGE) 500 MG tablet     . metoprolol tartrate (LOPRESSOR) 25 MG tablet TAKE 1 TABLET TWICE A DAY 180 tablet 3  . omeprazole (PRILOSEC) 20 MG capsule Take 1 capsule (20 mg total) by mouth daily. 30 capsule 3  . ondansetron (ZOFRAN) 4 MG tablet 1/2 to 1 tablet every 6 hours as needed nausea 100 tablet 2  . polyethylene glycol (MIRALAX / GLYCOLAX) packet Take 17 g by mouth daily. 14 each 0  . polyethylene glycol powder (GLYCOLAX/MIRALAX) powder Take 17 g by mouth 2 (two) times daily as needed. 3350 g 1  . simvastatin (ZOCOR) 40 MG tablet TAKE 1 TABLET BY MOUTH AT BEDTIME 90 tablet 3  .  traMADol (ULTRAM) 50 MG tablet 2 tablets every 6 hours as needed 100 tablet 5   No current facility-administered medications for this visit.     Allergies as of 08/22/2016  . (No Known Allergies)    ROS:  General: Negative for anorexia, weight loss, fever, chills, fatigue, weakness. ENT: Negative for hoarseness, difficulty swallowing , nasal congestion. CV: Negative for chest pain, angina, palpitations, dyspnea on exertion, peripheral edema.  Respiratory: Negative for dyspnea at rest, dyspnea on exertion, cough, sputum, wheezing.  GI: See history of present illness. GU:  Negative for dysuria, hematuria, urinary incontinence, urinary frequency, nocturnal urination.  Endo: Negative for unusual weight change.    Physical Examination:   BP (!) 153/76   Pulse 64   Temp 97.6 F (36.4 C) (Oral)   Ht 5\' 3"  (1.6 m)   Wt 141 lb (64 kg)   LMP  (LMP Unknown)   BMI 24.98 kg/m   General: Well-nourished, well-developed in no acute distress.  Eyes: No icterus. Conjunctivae pink. Mouth: Oropharyngeal mucosa moist and pink , no lesions erythema or exudate. Lungs: Clear to auscultation bilaterally. Non-labored. Heart: Regular rate and rhythm, no murmurs rubs or gallops.  Abdomen: Bowel sounds are normal, nontender, nondistended, no hepatosplenomegaly or masses, no abdominal bruits or hernia , no rebound or guarding.  Extremities: No lower extremity edema. No clubbing or deformities. Neuro: Alert and oriented x 3.  Grossly intact. Skin: Warm and dry, no jaundice.   Psych: Alert and cooperative, normal mood and affect.   Imaging Studies: Dg Abd 1 View  Result Date: 07/31/2016 CLINICAL DATA:  History of diarrhea EXAM: ABDOMEN - 1 VIEW COMPARISON:  05/28/2016 FINDINGS: Fecal material is noted throughout the colon consistent with a degree of constipation. Multiple gallstones are noted. There are bilateral renal calculi worse on the right than the left. The largest of the right renal calculi  measures approximately 11 mm in greatest dimension. These changes are stable from the prior exam. Stable degenerative change of the lumbar spine with scoliosis is seen. No free air is noted. IMPRESSION: Changes consistent with constipation. Cholelithiasis and bilateral renal calculi. Electronically Signed   By: Inez Catalina M.D.   On: 07/31/2016 15:26    Assessment and Plan:    Lynn Wall is a 82 y.o. y/o female here for follow up for IBS-C or chronic constipation . It is very likely she has a component of constipation related to tramadol use as well as the cystocele. She is presently doing well on Linzess with PRNuse of miralax. She is going to see her GYN to have the cystocele evaluated . I did talk briefly of other causes of constipation such as large polyps or neoplasm and the possible use of colonoscopy to rule them out. We disucssed the risks vs benefits and she chose not to go for a colonoscopy at this time.   Dr Jonathon Bellows  MD F/u in 3 months

## 2016-08-23 ENCOUNTER — Other Ambulatory Visit: Payer: Self-pay

## 2016-08-23 ENCOUNTER — Telehealth: Payer: Self-pay | Admitting: Family Medicine

## 2016-08-23 MED ORDER — OMEPRAZOLE 20 MG PO CPDR: 20.0000 mg | DELAYED_RELEASE_CAPSULE | Freq: Every day | ORAL | 3 refills | Status: DC

## 2016-08-23 NOTE — Addendum Note (Signed)
Addended by: Arnette Norris on: 08/23/2016 02:37 PM   Modules accepted: Orders

## 2016-08-23 NOTE — Telephone Encounter (Signed)
Called Pt to schedule AWV with [facility] - knb °

## 2016-09-07 ENCOUNTER — Ambulatory Visit (INDEPENDENT_AMBULATORY_CARE_PROVIDER_SITE_OTHER): Payer: Medicare Other | Admitting: Urology

## 2016-09-07 ENCOUNTER — Encounter: Payer: Self-pay | Admitting: Urology

## 2016-09-07 VITALS — BP 145/75 | HR 91 | Ht 63.0 in | Wt 139.0 lb

## 2016-09-07 DIAGNOSIS — N952 Postmenopausal atrophic vaginitis: Secondary | ICD-10-CM

## 2016-09-07 DIAGNOSIS — N3946 Mixed incontinence: Secondary | ICD-10-CM

## 2016-09-07 DIAGNOSIS — R102 Pelvic and perineal pain: Secondary | ICD-10-CM

## 2016-09-07 DIAGNOSIS — K582 Mixed irritable bowel syndrome: Secondary | ICD-10-CM

## 2016-09-07 DIAGNOSIS — N8111 Cystocele, midline: Secondary | ICD-10-CM

## 2016-09-07 NOTE — Progress Notes (Signed)
09/07/2016 10:35 AM   Bonnell Public 09-May-1927 [id number]  Referring provider: Jerrol Banana., MD [address] [place], [place] [address]  Chief Complaint  Patient presents with  . Bladder Prolapse    patient called clinic    HPI: Patient is an 83 year old Caucasian female who presents today for an evaluation for a possible cystocele with her daughter, [name].    We had last seen the patient in 03/2015 for post operative urinary retention, UTI and vaginal atrophy.    She had been seen recently by her GI physician, Dr. Vicente Males, for constipation.  She had her Linzess increased and is using [employer] prn.   She states she is still having issues with constipation alternating with diarrhea.  She is experiencing stool incontinence.    She is experiencing pelvic discomfort. She feels a sharp pain when she rises to a standing position in the pelvic region.    She states at night time she feels she has to rush to the bathroom 7 times to urinate, she states he uses the restroom 7 times during the day to urinate. She states she is returned do see fluid intake in an effort to stave off urination, she does engage in toilet mapping, she has 8 or more incontinent episodes during the week and she states she's getting up 3  times nightly to urinate.   PMH: Past Medical History:  Diagnosis Date  . Acid reflux   . Allergy   . Cystitis   . DDD (degenerative disc disease), lumbar   . Diabetes mellitus without complication ([place] [facility])   . GERD (gastroesophageal reflux disease)   . History of kidney stones   . History of renal stone   . Hyperlipidemia   . Hypertension   . Osteoporosis   . Urinary incontinence   . UTI (urinary tract infection)    currently on antibiotic    Surgical History: Past Surgical History:  Procedure Laterality Date  . ABDOMINAL HYSTERECTOMY    . CATARACT EXTRACTION, BILATERAL    . HEMORRHOID SURGERY    . LUMBAR LAMINECTOMY/DECOMPRESSION  MICRODISCECTOMY Left 03/14/2015   Procedure: LUMBAR LAMINECTOMY/DECOMPRESSION MICRODISCECTOMY 1 LEVEL;  Surgeon: Newman Pies, MD;  Location: [facility];  Service: Neurosurgery;  Laterality: Left;  Left L34 microdiskectomy    Home Medications:  Allergies as of 09/07/2016   No Known Allergies     Medication List       Accurate as of 09/07/16 10:35 AM. Always use your most recent med list.          aspirin EC 81 MG tablet Take 81 mg by mouth daily.   docusate sodium 100 MG capsule Commonly known as:  STOOL SOFTENER Take 1 capsule (100 mg total) by mouth daily.   enalapril 10 MG tablet Commonly known as:  VASOTEC 1 TABLET, ORAL, DAILY FOR BLOOD PRESSURE   hydrocortisone 25 MG suppository Commonly known as:  ANUSOL-HC PLACE 1 SUPPOSITORY (25 MG TOTAL) RECTALLY AT BEDTIME.   linaclotide 145 MCG Caps capsule Commonly known as:  LINZESS Take 1 capsule (145 mcg total) by mouth daily before breakfast.   meloxicam 15 MG tablet Commonly known as:  MOBIC Take 1 tablet by mouth daily.   metFORMIN 500 MG tablet Commonly known as:  GLUCOPHAGE   metoprolol tartrate 25 MG tablet Commonly known as:  LOPRESSOR TAKE 1 TABLET TWICE A DAY   omeprazole 20 MG capsule Commonly known as:  PRILOSEC Take 1 capsule (20  mg total) by mouth daily.   ondansetron 4 MG tablet Commonly known as:  ZOFRAN 1/2 to 1 tablet every 6 hours as needed nausea   polyethylene glycol powder powder Commonly known as:  GLYCOLAX/MIRALAX Take 17 g by mouth 2 (two) times daily as needed.   polyethylene glycol packet Commonly known as:  MIRALAX / GLYCOLAX Take 17 g by mouth daily.   simvastatin 40 MG tablet Commonly known as:  ZOCOR TAKE 1 TABLET BY MOUTH AT BEDTIME   traMADol 50 MG tablet Commonly known as:  ULTRAM 2 tablets every 6 hours as needed       Allergies: No Known Allergies  Family History: Family History  Problem Relation Age of Onset  . Cancer Father   . Kidney disease Neg Hx    . Prostate cancer Neg Hx   . Bladder Cancer Neg Hx     Social History:  reports that she has never smoked. She has never used smokeless tobacco. She reports that she does not drink alcohol or use drugs.  ROS: UROLOGY Frequent Urination?: No Hard to postpone urination?: Yes Burning/pain with urination?: Yes Get up at night to urinate?: Yes Leakage of urine?: Yes Urine stream starts and stops?: No Trouble starting stream?: No Do you have to strain to urinate?: No Blood in urine?: No Urinary tract infection?: No Sexually transmitted disease?: No Injury to kidneys or bladder?: No Painful intercourse?: No Weak stream?: No Currently pregnant?: No Vaginal bleeding?: No Last menstrual period?: n  Gastrointestinal Nausea?: Yes Vomiting?: No Indigestion/heartburn?: Yes Diarrhea?: Yes Constipation?: Yes  Constitutional Fever: No Night sweats?: No Weight loss?: No Fatigue?: Yes  Skin Skin rash/lesions?: Yes Itching?: No  Eyes Blurred vision?: No Double vision?: No  Ears/Nose/Throat Sore throat?: No Sinus problems?: No  Hematologic/Lymphatic Swollen glands?: No Easy bruising?: Yes  Cardiovascular Leg swelling?: No Chest pain?: No  Respiratory Cough?: No Shortness of breath?: No  Endocrine Excessive thirst?: No  Musculoskeletal Back pain?: Yes Joint pain?: No  Neurological Headaches?: No Dizziness?: Yes  Psychologic Depression?: No Anxiety?: No  Physical Exam: BP (!) 145/75   Pulse 91   Ht 5\' 3"  (1.6 m)   Wt 139 lb (63 kg)   LMP  (LMP Unknown)   BMI 24.62 kg/m   Constitutional: Well nourished. Alert and oriented, No acute distress. HEENT: [place] AT, moist mucus membranes. Trachea midline, no masses. Cardiovascular: No clubbing, cyanosis, or edema. Respiratory: Normal respiratory effort, no increased work of breathing. GI: Abdomen is soft, non tender, non distended, no abdominal masses. Liver and spleen not palpable.  No hernias appreciated.   Stool sample for occult testing is not indicated.   GU: No CVA tenderness.  No bladder fullness or masses.  Atrophic external genitalia, normal pubic hair distribution, no lesions.  Normal urethral meatus, no lesions, no prolapse, no discharge.   No urethral masses, tenderness and/or tenderness. No bladder fullness, tenderness or masses. Pale vagina mucosa, poor estrogen effect, no discharge, no lesions, good pelvic support, Grade II cystocele noted.  No rectocele noted.  No cervical motion tenderness.  Uterus is freely mobile and non-fixed.  No adnexal/parametria masses or tenderness noted.  Anus and perineum are without rashes or lesions.    Skin: No rashes, bruises or suspicious lesions. Lymph: No cervical or inguinal adenopathy. Neurologic: Grossly intact, no focal deficits, moving all 4 extremities. Psychiatric: Normal mood and affect.  Laboratory Data: Lab Results  Component Value Date   WBC 5.8 06/08/2016   HGB 12.0 03/07/2015  HCT 31.1 (L) 06/08/2016   MCV 87 06/08/2016   PLT 217 06/08/2016    Lab Results  Component Value Date   CREATININE 0.98 06/08/2016    Lab Results  Component Value Date   HGBA1C 6.7 (H) 03/12/2016    Lab Results  Component Value Date   TSH 1.100 03/12/2016       Component Value Date/Time   CHOL 136 03/12/2016 1439   HDL 47 03/12/2016 1439   LDLCALC 63 03/12/2016 1439    Lab Results  Component Value Date   AST 18 06/08/2016   Lab Results  Component Value Date   ALT 10 06/08/2016      Assessment & Plan:    1. Cystocele  - offered behavioral therapies, bladder training, bladder control strategies and pelvic floor muscle training - referral made to PT  - fluid management - patient is drinking adequate amounts of water  - offered refer to gynecology for a pessary fitting - referral made to gynecology   - daughter requested an appointment with one of our surgeon for a possible pelvic sling procedure, I have made the referral, but I  expressed by concerns about undergoing a surgery at her age and STRONGLY encouraged her to try the non-surgical options at this time (physical therapy and pessary, I also explained that bladder surgery would not address her IBS and/or pelvic pain and may actually increase her pelvic pain and/or IBS  2. Mixed incontinence  - see above   3. Vaginal atrophy  - patient will restart her vaginal estrogen cream, three nights weekly  4. Pelvic pain  - referral to PT  5. IBS  - followed by GI   Return for appointment with Dr. Matilde Sprang.  These notes generated with voice recognition software. I apologize for typographical errors.  Zara Council, [facility] [address], [place], [place] [address] [phone]

## 2016-09-07 NOTE — Telephone Encounter (Signed)
2nd message Called Pt to schedule AWV with [facility] - knb ° °

## 2016-09-10 ENCOUNTER — Telehealth: Payer: Self-pay

## 2016-09-10 ENCOUNTER — Ambulatory Visit (INDEPENDENT_AMBULATORY_CARE_PROVIDER_SITE_OTHER): Payer: Medicare Other | Admitting: Urology

## 2016-09-10 ENCOUNTER — Encounter: Payer: Self-pay | Admitting: Urology

## 2016-09-10 VITALS — BP 156/80 | HR 71 | Wt 146.0 lb

## 2016-09-10 DIAGNOSIS — N8110 Cystocele, unspecified: Secondary | ICD-10-CM

## 2016-09-10 NOTE — Telephone Encounter (Signed)
Dr. Vicente Males prescribed Linzess 1 a day then increased to 2 a day. She is needing another refill, but since he increased it, it's short before she is allowed to get another refill. Please call [facility] [place] to authorize her refill and change the dosage at that time. Please call patient and update afterwards.

## 2016-09-10 NOTE — Progress Notes (Signed)
09/10/2016 4:11 PM   Lynn Wall Jan 20, 1927 [id number]  Referring provider: Jerrol Banana., MD [address] [place],  [address]  Chief Complaint  Patient presents with  . Bladder Prolapse    HPI: Lynn Wall Patient is an 83 year old Caucasian female who presents today for an evaluation for a possible cystocele with her daughter, Lynn Wall.    We had last seen the patient in 03/2015 for post operative urinary retention, UTI and vaginal atrophy.    She had been seen recently by her GI physician, Dr. Vicente Males, for constipation.  She had her Linzess increased and is using [employer] prn.   She states she is still having issues with constipation alternating with diarrhea.  She is experiencing stool incontinence.    She is experiencing pelvic discomfort. She feels a sharp pain when she rises to a standing position in the pelvic region.  Grade 2 cystocele noted. Pt ordered for pelvic pain and estrogen for atrophy.   Today The patient does not feel vaginal bulging. She said when she sits or stands she can feel a lot of pressure diffusely in her lower mid abdomen and this is her primary symptom. She feels bloated. As noted she is having a lot of bowel issues and fecal incontinence.  During the day she is continent. She has foot on the floor syndrome but no bedwetting. She voids every 3 hours during the day and gets up twice a night to urinate.  She likely had a bladder suspension years ago a kidney stone. She has had back surgery.  She is concerned about getting a urinary tract infection because of her fecal incontinence not associated with awareness  Modifying factors: There are no other modifying factors  Associated signs and symptoms: There are no other associated signs and symptoms Aggravating and relieving factors: There are no other aggravating or relieving factors Severity: Moderate Duration: Persistent  PMH: Past Medical History:  Diagnosis Date  .  Acid reflux   . Allergy   . Cystitis   . DDD (degenerative disc disease), lumbar   . Diabetes mellitus without complication ([place])   . GERD (gastroesophageal reflux disease)   . History of kidney stones   . History of renal stone   . Hyperlipidemia   . Hypertension   . Osteoporosis   . Urinary incontinence   . UTI (urinary tract infection)    currently on antibiotic    Surgical History: Past Surgical History:  Procedure Laterality Date  . ABDOMINAL HYSTERECTOMY    . CATARACT EXTRACTION, BILATERAL    . HEMORRHOID SURGERY    . LUMBAR LAMINECTOMY/DECOMPRESSION MICRODISCECTOMY Left 03/14/2015   Procedure: LUMBAR LAMINECTOMY/DECOMPRESSION MICRODISCECTOMY 1 LEVEL;  Surgeon: Newman Pies, MD;  Location: [facility];  Service: Neurosurgery;  Laterality: Left;  Left L34 microdiskectomy    Home Medications:  Allergies as of 09/10/2016   No Known Allergies     Medication List       Accurate as of 09/10/16  4:11 PM. Always use your most recent med list.          aspirin EC 81 MG tablet Take 81 mg by mouth daily.   docusate sodium 100 MG capsule Commonly known as:  STOOL SOFTENER Take 1 capsule (100 mg total) by mouth daily.   enalapril 10 MG tablet Commonly known as:  VASOTEC 1 TABLET, ORAL, DAILY FOR BLOOD PRESSURE   hydrocortisone 25 MG suppository Commonly known as:  ANUSOL-HC PLACE 1 SUPPOSITORY (25  MG TOTAL) RECTALLY AT BEDTIME.   linaclotide 145 MCG Caps capsule Commonly known as:  LINZESS Take 1 capsule (145 mcg total) by mouth daily before breakfast.   meloxicam 15 MG tablet Commonly known as:  MOBIC Take 1 tablet by mouth daily.   metFORMIN 500 MG tablet Commonly known as:  GLUCOPHAGE   metoprolol tartrate 25 MG tablet Commonly known as:  LOPRESSOR TAKE 1 TABLET TWICE A DAY   omeprazole 20 MG capsule Commonly known as:  PRILOSEC Take 1 capsule (20 mg total) by mouth daily.   ondansetron 4 MG tablet Commonly known as:  ZOFRAN 1/2 to 1 tablet  every 6 hours as needed nausea   polyethylene glycol powder powder Commonly known as:  GLYCOLAX/MIRALAX Take 17 g by mouth 2 (two) times daily as needed.   polyethylene glycol packet Commonly known as:  MIRALAX / GLYCOLAX Take 17 g by mouth daily.   simvastatin 40 MG tablet Commonly known as:  ZOCOR TAKE 1 TABLET BY MOUTH AT BEDTIME   traMADol 50 MG tablet Commonly known as:  ULTRAM 2 tablets every 6 hours as needed       Allergies: No Known Allergies  Family History: Family History  Problem Relation Age of Onset  . Cancer Father   . Kidney disease Neg Hx   . Prostate cancer Neg Hx   . Bladder Cancer Neg Hx     Social History:  reports that she has never smoked. She has never used smokeless tobacco. She reports that she does not drink alcohol or use drugs.  ROS: UROLOGY Frequent Urination?: Yes Hard to postpone urination?: Yes Burning/pain with urination?: No Get up at night to urinate?: Yes Leakage of urine?: Yes Urine stream starts and stops?: No Trouble starting stream?: No Do you have to strain to urinate?: No Blood in urine?: No Urinary tract infection?: No Sexually transmitted disease?: No Injury to kidneys or bladder?: No Painful intercourse?: No Weak stream?: No Currently pregnant?: No Vaginal bleeding?: No Last menstrual period?: hysterectomy  Gastrointestinal Nausea?: No Vomiting?: No Indigestion/heartburn?: Yes Diarrhea?: Yes Constipation?: Yes  Constitutional Fever: No Night sweats?: No Weight loss?: No Fatigue?: No  Skin Skin rash/lesions?: No Itching?: No  Eyes Blurred vision?: No Double vision?: No  Ears/Nose/Throat Sore throat?: No Sinus problems?: No  Hematologic/Lymphatic Swollen glands?: No Easy bruising?: No  Cardiovascular Leg swelling?: No Chest pain?: No  Respiratory Cough?: No Shortness of breath?: No  Endocrine Excessive thirst?: No  Musculoskeletal Back pain?: Yes Joint pain?:  No  Neurological Headaches?: No Dizziness?: No  Psychologic Depression?: No Anxiety?: No  Physical Exam: BP (!) 156/80   Pulse 71   Wt 146 lb (66.2 kg)   LMP  (LMP Unknown)   BMI 25.86 kg/m   Constitutional:  Alert and oriented, No acute distress. HEENT: [place] AT, moist mucus membranes.  Trachea midline, no masses. Cardiovascular: No clubbing, cyanosis, or edema. Respiratory: Normal respiratory effort, no increased work of breathing. GI: Abdomen is soft, nontender, nondistended, no abdominal masses GU: No CVA tenderness. Moderate grade 2 cystocele with minimal atrophy. No rectocele or stress incontinence. The cystocele did not reach the urethrovesical angle and her vaginal cuff was reasonably well supported Skin: No rashes, bruises or suspicious lesions. Lymph: No cervical or inguinal adenopathy. Neurologic: Grossly intact, no focal deficits, moving all 4 extremities. Psychiatric: Normal mood and affect.  Laboratory Data: Lab Results  Component Value Date   WBC 5.8 06/08/2016   HGB 12.0 03/07/2015   HCT 31.1 (L)  06/08/2016   MCV 87 06/08/2016   PLT 217 06/08/2016    Lab Results  Component Value Date   CREATININE 0.98 06/08/2016    No results found for: PSA  No results found for: TESTOSTERONE  Lab Results  Component Value Date   HGBA1C 6.7 (H) 03/12/2016    Urinalysis    Component Value Date/Time   COLORURINE YELLOW 03/14/2015 1710   APPEARANCEUR Clear 03/28/2015 0956   LABSPEC 1.011 03/14/2015 1710   PHURINE 6.0 03/14/2015 1710   GLUCOSEU Negative 03/28/2015 0956   HGBUR NEGATIVE 03/14/2015 1710   BILIRUBINUR Negative 03/28/2015 0956   KETONESUR 15 (A) 03/14/2015 1710   PROTEINUR Negative 03/28/2015 0956   PROTEINUR NEGATIVE 03/14/2015 1710   UROBILINOGEN 0.2 03/14/2015 1710   NITRITE Negative 03/28/2015 0956   NITRITE NEGATIVE 03/14/2015 1710   LEUKOCYTESUR Trace (A) 03/28/2015 0956    Pertinent Imaging: none  Assessment & Plan:  The patient has  mild frequency and nocturia. I do not believe that the abdominal pressure is from her bladder. It is likely from the gastrointestinal tract. By history she does not have symptomatic prolapse  The patient has a moderate cystocele and certainly can see why she is not feeling it. A picture was drawn. The family understands that it is not the cause of her abdominal complaints and nocturia.  Reassurance given. See when necessary  There are no diagnoses linked to this encounter.  Return if symptoms worsen or fail to improve.  Reece Packer, MD  [facility] [address], [place], [place] [address] [phone]

## 2016-09-11 ENCOUNTER — Other Ambulatory Visit: Payer: Self-pay

## 2016-09-11 MED ORDER — LINACLOTIDE 290 MCG PO CAPS: 290.0000 ug | ORAL_CAPSULE | Freq: Every day | ORAL | 6 refills | Status: DC

## 2016-09-11 NOTE — Telephone Encounter (Signed)
Spoke with pt's daughter regarding medication. Since pt is currently taking Linzess 187mcg BID. I have sent to the pharmacy Linzess 253mcg daily.

## 2016-09-11 NOTE — Telephone Encounter (Signed)
Patient is calling again per the Linzess message below. Please call patient and advice

## 2016-10-30 DIAGNOSIS — L578 Other skin changes due to chronic exposure to nonionizing radiation: Secondary | ICD-10-CM | POA: Diagnosis not present

## 2016-10-30 DIAGNOSIS — C44319 Basal cell carcinoma of skin of other parts of face: Secondary | ICD-10-CM | POA: Diagnosis not present

## 2016-10-30 DIAGNOSIS — Z85828 Personal history of other malignant neoplasm of skin: Secondary | ICD-10-CM | POA: Diagnosis not present

## 2016-10-30 DIAGNOSIS — L821 Other seborrheic keratosis: Secondary | ICD-10-CM | POA: Diagnosis not present

## 2016-10-31 ENCOUNTER — Encounter: Payer: Medicare Other | Admitting: Obstetrics and Gynecology

## 2016-11-01 ENCOUNTER — Ambulatory Visit: Payer: Medicare Other | Admitting: Physical Therapy

## 2016-11-06 ENCOUNTER — Encounter: Payer: Medicare Other | Admitting: Physical Therapy

## 2016-11-15 ENCOUNTER — Encounter: Payer: Medicare Other | Admitting: Physical Therapy

## 2016-11-20 ENCOUNTER — Other Ambulatory Visit: Payer: Self-pay

## 2016-11-20 ENCOUNTER — Encounter (INDEPENDENT_AMBULATORY_CARE_PROVIDER_SITE_OTHER): Payer: Self-pay

## 2016-11-20 ENCOUNTER — Ambulatory Visit (INDEPENDENT_AMBULATORY_CARE_PROVIDER_SITE_OTHER): Payer: Medicare Other | Admitting: Gastroenterology

## 2016-11-20 ENCOUNTER — Encounter: Payer: Self-pay | Admitting: Gastroenterology

## 2016-11-20 ENCOUNTER — Other Ambulatory Visit: Payer: Self-pay | Admitting: Gastroenterology

## 2016-11-20 VITALS — BP 170/75 | HR 59 | Temp 97.8°F | Ht 63.0 in | Wt 141.4 lb

## 2016-11-20 DIAGNOSIS — R194 Change in bowel habit: Secondary | ICD-10-CM

## 2016-11-20 NOTE — Progress Notes (Signed)
Primary Care Physician: [name], MD  Primary Gastroenterologist:  Dr. Jonathon Bellows   Chief Complaint  Patient presents with  . Follow-up    Room 2    HPI: Lynn Wall is a 83 y.o. female here to follow up for IBS-C. She/He was last seen on 08/22/16 .She has been on meloxicam ,h/o pelvic floor repair , on tramdol occasionally .   I diagnosed her with chronic constipation with overflow diarrha. A flat plate x ray confirmed constipation and I commenced her on linzess 145 mcg, she increased it to 290 mcg, passing small qty of stool daily , prior to which was going once a week. She feels she is comfortable at this point and is much improved.   Interval history 07/2016-10/2016  Evaluated by urology and diagnosed with a grade 2 cystocele    Current Outpatient Prescriptions  Medication Sig Dispense Refill  . aspirin EC 81 MG tablet Take 81 mg by mouth daily.    Marland Kitchen docusate sodium (STOOL SOFTENER) 100 MG capsule Take 1 capsule (100 mg total) by mouth daily. 10 capsule 0  . enalapril (VASOTEC) 10 MG tablet 1 TABLET, ORAL, DAILY FOR BLOOD PRESSURE 90 tablet 4  . linaclotide (LINZESS) 290 MCG CAPS capsule Take 1 capsule (290 mcg total) by mouth daily before breakfast. 30 capsule 6  . meloxicam (MOBIC) 15 MG tablet Take 1 tablet by mouth daily.    . metoprolol tartrate (LOPRESSOR) 25 MG tablet TAKE 1 TABLET TWICE A DAY 180 tablet 3  . omeprazole (PRILOSEC) 20 MG capsule Take 1 capsule (20 mg total) by mouth daily. 90 capsule 3  . ondansetron (ZOFRAN) 4 MG tablet 1/2 to 1 tablet every 6 hours as needed nausea 100 tablet 2  . polyethylene glycol (MIRALAX / GLYCOLAX) packet Take 17 g by mouth daily. 14 each 0  . raloxifene (EVISTA) 60 MG tablet Take by mouth.    . simvastatin (ZOCOR) 40 MG tablet TAKE 1 TABLET BY MOUTH AT BEDTIME 90 tablet 3  . traMADol (ULTRAM) 50 MG tablet 2 tablets every 6 hours as needed 100 tablet 5  . gabapentin (NEURONTIN) 100 MG capsule Take by mouth.    .  hydrocortisone (ANUSOL-HC) 25 MG suppository PLACE 1 SUPPOSITORY (25 MG TOTAL) RECTALLY AT BEDTIME.  5  . linaclotide (LINZESS) 145 MCG CAPS capsule Take 1 capsule (145 mcg total) by mouth daily before breakfast. (Patient not taking: Reported on 11/20/2016) 30 capsule 5  . metFORMIN (GLUCOPHAGE) 500 MG tablet     . polyethylene glycol powder (GLYCOLAX/MIRALAX) powder Take 17 g by mouth 2 (two) times daily as needed. (Patient not taking: Reported on 11/20/2016) 3350 g 1   No current facility-administered medications for this visit.     Allergies as of 11/20/2016  . (No Known Allergies)    ROS:  General: Negative for anorexia, weight loss, fever, chills, fatigue, weakness. ENT: Negative for hoarseness, difficulty swallowing , nasal congestion. CV: Negative for chest pain, angina, palpitations, dyspnea on exertion, peripheral edema.  Respiratory: Negative for dyspnea at rest, dyspnea on exertion, cough, sputum, wheezing.  GI: See history of present illness. GU:  Negative for dysuria, hematuria, urinary incontinence, urinary frequency, nocturnal urination.  Endo: Negative for unusual weight change.    Physical Examination:   BP (!) 170/75 (BP Location: Left Arm, Patient Position: Sitting, Cuff Size: Normal)   Pulse (!) 59   Temp 97.8 F (36.6 C) (Oral)   Ht 5\' 3"  (1.6 m)   Wt  141 lb 6.4 oz (64.1 kg)   LMP  (LMP Unknown)   BMI 25.05 kg/m   General: Well-nourished, well-developed in no acute distress.  Eyes: No icterus. Conjunctivae pink. Mouth: Oropharyngeal mucosa moist and pink , no lesions erythema or exudate. Lungs: Clear to auscultation bilaterally. Non-labored. Heart: Regular rate and rhythm, no murmurs rubs or gallops.  Abdomen: Bowel sounds are normal, nontender, nondistended, no hepatosplenomegaly or masses, no abdominal bruits or hernia , no rebound or guarding.   Extremities: No lower extremity edema. No clubbing or deformities. Neuro: Alert and oriented x 3.  Grossly  intact.e.   Psych: Alert and cooperative, normal mood and affect   Imaging Studies: No results found.  Assessment and Plan:   Lynn Wall a 83 y.o.y/o femalehere for follow up for IBS-C or chronic constipation . It is very likely she has a component of constipation related to tramadol . She is doing ok with linzess and prn Miralax. We did discuss about a colonoscopy to r/o any luminal lesions causing a change in bowel habits but she is not interested due to the associated risks when discussed and would like to try a CT colonography. She also has a lot of has likely from the constipation due to production of methane. Suggest daily miralax and use of activated charcoal.    Dr Jonathon Bellows  MD Follow up in 2 months

## 2016-11-22 ENCOUNTER — Encounter: Payer: Medicare Other | Admitting: Physical Therapy

## 2016-11-29 ENCOUNTER — Ambulatory Visit: Payer: Medicare Other

## 2016-11-29 ENCOUNTER — Ambulatory Visit
Admission: RE | Admit: 2016-11-29 | Discharge: 2016-11-29 | Disposition: A | Discharge status: Home / Self Care | Payer: Medicare Other | Source: Ambulatory Visit | Attending: Gastroenterology | Admitting: Gastroenterology

## 2016-11-29 ENCOUNTER — Other Ambulatory Visit: Payer: Self-pay | Admitting: Family Medicine

## 2016-11-29 DIAGNOSIS — R194 Change in bowel habit: Secondary | ICD-10-CM

## 2016-11-29 DIAGNOSIS — K5730 Diverticulosis of large intestine without perforation or abscess without bleeding: Secondary | ICD-10-CM | POA: Diagnosis not present

## 2016-11-29 NOTE — Telephone Encounter (Signed)
Done. Prescription called into pharmacy.  

## 2016-11-29 NOTE — Telephone Encounter (Signed)
May call in or print out tramadol 50 mg. #100 no refills

## 2016-12-04 ENCOUNTER — Telehealth: Payer: Self-pay

## 2016-12-04 NOTE — Telephone Encounter (Signed)
-----   Message from Jonathon Bellows, MD sent at 12/03/2016  9:54 AM EDT ----- Inform no mass , growth or polyp seen that is large on the CT scan which is reassuring , she does have sigmoid diverticulosis which may be causing her constipation from underdistension of the lumen

## 2016-12-04 NOTE — Telephone Encounter (Signed)
Advise HPA of results per Dr. Vicente Males.   Confirmed next appointment.

## 2016-12-06 ENCOUNTER — Encounter: Payer: Medicare Other | Admitting: Physical Therapy

## 2016-12-20 ENCOUNTER — Encounter: Payer: Medicare Other | Admitting: Physical Therapy

## 2017-01-01 DIAGNOSIS — L905 Scar conditions and fibrosis of skin: Secondary | ICD-10-CM | POA: Diagnosis not present

## 2017-01-01 DIAGNOSIS — Z85828 Personal history of other malignant neoplasm of skin: Secondary | ICD-10-CM | POA: Diagnosis not present

## 2017-01-03 ENCOUNTER — Encounter: Payer: Medicare Other | Admitting: Physical Therapy

## 2017-01-10 ENCOUNTER — Ambulatory Visit (INDEPENDENT_AMBULATORY_CARE_PROVIDER_SITE_OTHER): Payer: Medicare Other | Admitting: Family Medicine

## 2017-01-10 ENCOUNTER — Ambulatory Visit (INDEPENDENT_AMBULATORY_CARE_PROVIDER_SITE_OTHER): Payer: Medicare Other

## 2017-01-10 VITALS — BP 128/72 | HR 64 | Temp 97.7°F | Ht 63.0 in | Wt 140.8 lb

## 2017-01-10 VITALS — BP 128/72 | HR 64 | Temp 97.7°F | Ht 63.0 in | Wt 140.0 lb

## 2017-01-10 DIAGNOSIS — Z0000 Encounter for general adult medical examination without abnormal findings: Secondary | ICD-10-CM | POA: Diagnosis not present

## 2017-01-10 DIAGNOSIS — E119 Type 2 diabetes mellitus without complications: Secondary | ICD-10-CM | POA: Diagnosis not present

## 2017-01-10 DIAGNOSIS — E784 Other hyperlipidemia: Secondary | ICD-10-CM

## 2017-01-10 DIAGNOSIS — E7849 Other hyperlipidemia: Secondary | ICD-10-CM

## 2017-01-10 DIAGNOSIS — G8929 Other chronic pain: Secondary | ICD-10-CM | POA: Diagnosis not present

## 2017-01-10 DIAGNOSIS — G4709 Other insomnia: Secondary | ICD-10-CM | POA: Diagnosis not present

## 2017-01-10 DIAGNOSIS — I10 Essential (primary) hypertension: Secondary | ICD-10-CM | POA: Diagnosis not present

## 2017-01-10 DIAGNOSIS — Z23 Encounter for immunization: Secondary | ICD-10-CM

## 2017-01-10 DIAGNOSIS — R197 Diarrhea, unspecified: Secondary | ICD-10-CM

## 2017-01-10 LAB — POCT UA - MICROALBUMIN: Microalbumin Ur, POC: 20 mg/L

## 2017-01-10 LAB — POCT GLYCOSYLATED HEMOGLOBIN (HGB A1C): Hemoglobin A1C: 6.7

## 2017-01-10 MED ORDER — TRAZODONE HCL 50 MG PO TABS: 50.0000 mg | ORAL_TABLET | Freq: Every day | ORAL | 12 refills | Status: DC

## 2017-01-10 NOTE — Progress Notes (Signed)
Subjective:   Lynn Wall is a 83 y.o. female who presents for Medicare Annual (Subsequent) preventive examination.  Review of Systems:  N/A  Cardiac Risk Factors include: advanced age (>6men, >6 women);diabetes mellitus;dyslipidemia;hypertension     Objective:     Vitals: BP 128/72 (BP Location: Right Arm)   Pulse 64   Temp 97.7 F (36.5 C) (Oral)   Ht 5\' 3"  (1.6 m)   Wt 140 lb 12.8 oz (63.9 kg)   LMP  (LMP Unknown)   BMI 24.94 kg/m   Body mass index is 24.94 kg/m.   Tobacco History  Smoking Status  . Never Smoker  Smokeless Tobacco  . Never Used     Counseling given: Not Answered   Past Medical History:  Diagnosis Date  . Acid reflux   . Allergy   . Cystitis   . DDD (degenerative disc disease), lumbar   . Diabetes mellitus without complication ([place])   . GERD (gastroesophageal reflux disease)   . History of kidney stones   . History of renal stone   . Hyperlipidemia   . Hypertension   . Osteoporosis   . Urinary incontinence   . UTI (urinary tract infection)    currently on antibiotic   Past Surgical History:  Procedure Laterality Date  . ABDOMINAL HYSTERECTOMY    . CATARACT EXTRACTION, BILATERAL    . HEMORRHOID SURGERY    . LUMBAR LAMINECTOMY/DECOMPRESSION MICRODISCECTOMY Left 03/14/2015   Procedure: LUMBAR LAMINECTOMY/DECOMPRESSION MICRODISCECTOMY 1 LEVEL;  Surgeon: Newman Pies, MD;  Location: [facility];  Service: Neurosurgery;  Laterality: Left;  Left L34 microdiskectomy   Family History  Problem Relation Age of Onset  . Cancer Father   . Kidney disease Neg Hx   . Prostate cancer Neg Hx   . Bladder Cancer Neg Hx    History  Sexual Activity  . Sexual activity: Not Currently    Outpatient Encounter Prescriptions as of 01/10/2017  Medication Sig  . aspirin EC 81 MG tablet Take 81 mg by mouth daily.  Marland Kitchen docusate sodium (STOOL SOFTENER) 100 MG capsule Take 1 capsule (100 mg total) by mouth daily.  . enalapril (VASOTEC) 10 MG  tablet 1 TABLET, ORAL, DAILY FOR BLOOD PRESSURE  . linaclotide (LINZESS) 290 MCG CAPS capsule Take 1 capsule (290 mcg total) by mouth daily before breakfast.  . meloxicam (MOBIC) 15 MG tablet Take 1 tablet by mouth daily.  . metoprolol tartrate (LOPRESSOR) 25 MG tablet TAKE 1 TABLET TWICE A DAY  . omeprazole (PRILOSEC) 20 MG capsule Take 1 capsule (20 mg total) by mouth daily.  . ondansetron (ZOFRAN) 4 MG tablet 1/2 to 1 tablet every 6 hours as needed nausea  . polyethylene glycol (MIRALAX / GLYCOLAX) packet Take 17 g by mouth daily.  . simvastatin (ZOCOR) 40 MG tablet TAKE 1 TABLET BY MOUTH AT BEDTIME (Patient taking differently: TAKE 80 mg TABLET BY MOUTH AT BEDTIME)  . traMADol (ULTRAM) 50 MG tablet TAKE 2 TABLETS EVERY 6 HOURS AS NEEDED  . gabapentin (NEURONTIN) 100 MG capsule Take by mouth.  . hydrocortisone (ANUSOL-HC) 25 MG suppository PLACE 1 SUPPOSITORY (25 MG TOTAL) RECTALLY AT BEDTIME.  Marland Kitchen linaclotide (LINZESS) 145 MCG CAPS capsule Take 1 capsule (145 mcg total) by mouth daily before breakfast. (Patient not taking: Reported on 11/20/2016)  . metFORMIN (GLUCOPHAGE) 500 MG tablet   . polyethylene glycol powder (GLYCOLAX/MIRALAX) powder Take 17 g by mouth 2 (two) times daily as needed. (Patient not taking: Reported on 11/20/2016)  .  raloxifene (EVISTA) 60 MG tablet Take by mouth.   No facility-administered encounter medications on file as of 01/10/2017.     Activities of Daily Living In your present state of health, do you have any difficulty performing the following activities: 01/10/2017 05/28/2016  Hearing? N N  Vision? N N  Difficulty concentrating or making decisions? Y N  Walking or climbing stairs? Y N  Dressing or bathing? N N  Doing errands, shopping? Y N  Preparing Food and eating ? N -  Using the Toilet? N -  In the past six months, have you accidently leaked urine? Y -  Do you have problems with loss of bowel control? Y -  Managing your Medications? N -  Managing your  Finances? N -  Housekeeping or managing your Housekeeping? N -  Some recent data might be hidden    Patient Care Team: Jerrol Banana., MD as PCP - General (Family Medicine)    Assessment:     Exercise Activities and Dietary recommendations Current Exercise Habits: Home exercise routine, Type of exercise: stretching, Time (Minutes): 15, Frequency (Times/Week): 3, Weekly Exercise (Minutes/Week): 45, Intensity: Mild, Exercise limited by: orthopedic condition(s)  Goals    . Increase water intake          Recommend increasing water intake to 6 glasses a day.      Fall Risk Fall Risk  01/10/2017 11/07/2015  Falls in the past year? Yes No  Number falls in past yr: 1 -  Injury with Fall? No -  Follow up Falls prevention discussed -   Depression Screen PHQ 2/9 Scores 01/10/2017 11/07/2015  PHQ - 2 Score 0 0     Cognitive Function        Immunization History  Administered Date(s) Administered  . Influenza, High Dose Seasonal PF 05/23/2015, 08/06/2016  . Tdap 03/27/2012   Screening Tests Health Maintenance  Topic Date Due  . FOOT EXAM  08/20/1937  . OPHTHALMOLOGY EXAM  08/20/1937  . PNA vac Low Risk Adult (1 of 2 - PCV13) 08/19/1994  . HEMOGLOBIN A1C  09/12/2016  . INFLUENZA VACCINE  03/27/2017  . TETANUS/TDAP  03/27/2022  . DEXA SCAN  Completed      Plan:  I have personally reviewed and addressed the Medicare Annual Wellness questionnaire and have noted the following in the patient's chart:  A. Medical and social history B. Use of alcohol, tobacco or illicit drugs  C. Current medications and supplements D. Functional ability and status E.  Nutritional status F.  Physical activity G. Advance directives H. List of other physicians I.  Hospitalizations, surgeries, and ER visits in previous 12 months J.  [place] such as hearing and vision if needed, cognitive and depression L. Referrals and appointments - none  In addition, I have reviewed and  discussed with patient certain preventive protocols, quality metrics, and best practice recommendations. A written personalized care plan for preventive services as well as general preventive health recommendations were provided to patient.  See attached scanned questionnaire for additional information.   Signed,  Fabio Neighbors, LPN Nurse Health Advisor   MD Recommendations: Pt needs a diabetic foot exam and her Hgb A1c checked today. Pt to discuss with PCP about pneumonia vaccines. I have reviewed the health advisors note, was  available for consultation and I agree with documentation and plan. Miguel Aschoff MD [facility]

## 2017-01-10 NOTE — Progress Notes (Signed)
Lynn Wall  MRN: [id number] DOB: 11/28/1926  Subjective:  HPI  Patient is here for follow up. Last office visit was on 07/02/16 for acute issue. Last lab work: [place] on 06/08/16, TSH and Lipid on 03/12/16. B/P: checks her b/p but is not sure of the exact readings.  BP Readings from Last 3 Encounters:  01/10/17 128/72  01/10/17 128/72  11/20/16 (!) 170/75   Diabetes: patient is checking her sugar fasting and readings have been around 119-120. No hypoglycemic episodes. Patient is not taking Metformin this was stopped before in case it was contributing to diarrhea issue. Lab Results  Component Value Date   HGBA1C 6.7 (H) 03/12/2016   GERD: takes Omeprazole about 2 times a week. Patient is seen Dr Lynn Wall for change in bowel habits. She had CT virtual colonoscopy on 11/29/16 and results were as below: IMPRESSION: Moderate layering fluid in the colon, constraining endoluminal fly through imaging. Evaluation is primarily performed based on 2D CT images.  Within that constraint, there is no significant colonic polyp, mass, apple core lesion, or stricture.  Sigmoid colon is underdistended with sigmoid diverticulosis and mild mucosal hypertrophy. No evidence of diverticulitis.  Cholelithiasis without associated inflammatory changes. 11 mm nonobstructing right lower pole renal calculus, without hydronephrosis. Left renal cysts, measuring up to 8.0 cm in the upper pole.  Additional ancillary unenhanced CT findings as above. Patient is on Linzess at this time. Patient states she is not having constipation issues, she is taking Miralax daily. Her bowels are daily either a little bit and normal bowel or she is having bowel incontinence. Patient is also having issues with insomnia, patient has chronic back pain and it is hard to lay down for too long. Patient Active Problem List   Diagnosis Date Noted  . Basal cell carcinoma 01/17/2016  . CN (constipation) 05/23/2015  . DDD  (degenerative disc disease), lumbar 05/23/2015  . Diabetes mellitus type 2 without retinopathy ([name]) 05/23/2015  . Fatigue 05/23/2015  . Acid reflux 05/23/2015  . LBP (low back pain) 05/23/2015  . Drusen of macula 05/23/2015  . Neuralgia neuritis, sciatic nerve 05/23/2015  . Basal cell papilloma 05/23/2015  . Acute urinary retention 03/28/2015  . Lumbar foraminal stenosis 03/14/2015  . Chronic cystitis 03/03/2015  . Cystocele, grade 2 02/18/2015  . History of kidney stones 02/18/2015  . Atrophic vaginitis 02/15/2015  . Recurrent UTI 02/15/2015  . Displacement of lumbar intervertebral disc without myelopathy 12/01/2014  . Bulge of lumbar disc without myelopathy 12/01/2014  . Lumbar radiculitis 11/10/2014  . Internal hemorrhoids without complication 21/30/8657  . Flutter-fibrillation 03/22/2008  . Paroxysmal supraventricular tachycardia ([place]) 03/22/2008  . Colon, diverticulosis 10/28/2007  . History of colon polyps 10/28/2007  . Malignant neoplasm of skin of parts of face 10/28/2007  . Calculus of kidney 10/18/2007  . Benign essential HTN 10/18/2007  . OP (osteoporosis) 10/18/2007  . Diabetes ([place]) 10/16/2007  . Hypercholesteremia 10/16/2007    Past Medical History:  Diagnosis Date  . Acid reflux   . Allergy   . Cystitis   . DDD (degenerative disc disease), lumbar   . Diabetes mellitus without complication ([place])   . GERD (gastroesophageal reflux disease)   . History of kidney stones   . History of renal stone   . Hyperlipidemia   . Hypertension   . Osteoporosis   . Urinary incontinence   . UTI (urinary tract infection)    currently on antibiotic    Social History   Social History  .  Marital status: Married    Spouse name: N/A  . Number of children: N/A  . Years of education: N/A   Occupational History  . Not on file.   Social History Main Topics  . Smoking status: Never Smoker  . Smokeless tobacco: Never Used  . Alcohol use No  . Drug use: No  . Sexual  activity: Not Currently   Other Topics Concern  . Not on file   Social History Narrative  . No narrative on file    Outpatient Encounter Prescriptions as of 01/10/2017  Medication Sig Note  . aspirin EC 81 MG tablet Take 81 mg by mouth daily.   Marland Kitchen docusate sodium (STOOL SOFTENER) 100 MG capsule Take 1 capsule (100 mg total) by mouth daily.   . enalapril (VASOTEC) 10 MG tablet 1 TABLET, ORAL, DAILY FOR BLOOD PRESSURE   . gabapentin (NEURONTIN) 100 MG capsule Take by mouth.   . hydrocortisone (ANUSOL-HC) 25 MG suppository PLACE 1 SUPPOSITORY (25 MG TOTAL) RECTALLY AT BEDTIME. 08/21/2016: Received from: External Pharmacy  . linaclotide (LINZESS) 290 MCG CAPS capsule Take 1 capsule (290 mcg total) by mouth daily before breakfast.   . meloxicam (MOBIC) 15 MG tablet Take 1 tablet by mouth daily. 05/14/2016: Received from: External Pharmacy  . metoprolol tartrate (LOPRESSOR) 25 MG tablet TAKE 1 TABLET TWICE A DAY   . omeprazole (PRILOSEC) 20 MG capsule Take 1 capsule (20 mg total) by mouth daily.   . ondansetron (ZOFRAN) 4 MG tablet 1/2 to 1 tablet every 6 hours as needed nausea 09/07/2016: PRN  . polyethylene glycol (MIRALAX / GLYCOLAX) packet Take 17 g by mouth daily.   . raloxifene (EVISTA) 60 MG tablet Take by mouth.   . simvastatin (ZOCOR) 40 MG tablet TAKE 1 TABLET BY MOUTH AT BEDTIME (Patient taking differently: TAKE 80 mg TABLET BY MOUTH AT BEDTIME)   . traMADol (ULTRAM) 50 MG tablet TAKE 2 TABLETS EVERY 6 HOURS AS NEEDED   . [DISCONTINUED] linaclotide (LINZESS) 145 MCG CAPS capsule Take 1 capsule (145 mcg total) by mouth daily before breakfast. (Patient not taking: Reported on 11/20/2016)   . [DISCONTINUED] metFORMIN (GLUCOPHAGE) 500 MG tablet  08/21/2016: Received from: External Pharmacy  . [DISCONTINUED] polyethylene glycol powder (GLYCOLAX/MIRALAX) powder Take 17 g by mouth 2 (two) times daily as needed. (Patient not taking: Reported on 11/20/2016)    No facility-administered encounter  medications on file as of 01/10/2017.     No Known Allergies  Review of Systems  Constitutional: Positive for malaise/fatigue.       Decreased appetite  Eyes: Negative.   Respiratory: Negative.   Cardiovascular: Negative.   Gastrointestinal: Positive for diarrhea (at times) and nausea (sometimes).       Excessive gas. Bowel incontinence.  Musculoskeletal: Positive for back pain and joint pain.  Neurological: Positive for dizziness (sometimes). Negative for tingling, tremors and weakness.  Psychiatric/Behavioral: The patient has insomnia (not sleeping all night, keeps waking up or has hard time going to sleep sometimes).     Objective:  BP 128/72   Pulse 64   Temp 97.7 F (36.5 C)   Ht 5\' 3"  (1.6 m)   Wt 140 lb (63.5 kg)   LMP  (LMP Unknown)   BMI 24.80 kg/m   Physical Exam  Constitutional: She is oriented to person, place, and time and well-developed, well-nourished, and in no distress.  HENT:  Head: Normocephalic and atraumatic.  Right Ear: External ear normal.  Left Ear: External ear normal.  Nose: Nose  normal.  Eyes: Conjunctivae are normal. Pupils are equal, round, and reactive to light.  Neck: Normal range of motion. Neck supple.  Cardiovascular: Normal rate, regular rhythm, normal heart sounds and intact distal pulses.  Exam reveals no gallop.   No murmur heard. Pulmonary/Chest: Effort normal and breath sounds normal. No respiratory distress. She has no wheezes.  Abdominal: Soft.  Neurological: She is alert and oriented to person, place, and time.  Skin: Skin is warm and dry.  Psychiatric: Mood, memory, affect and judgment normal.   Assessment and Plan :  1. Benign essential HTN Stable. continue current regimen.  2. Other chronic pain Discussed risks vs benefits of taking Prednisone long term. May do a Depo Medrol on the next visit. 3. Other hyperlipidemia  4. Diabetes mellitus type 2 without retinopathy (HCC) A1C 6.7-stable. - POCT HgB A1C - POCT UA -  Microalbumin  5. Diarrhea, unspecified type Discussed this with patient. Patient advised to take Linzess lower dose. Patient has follow up with Dr Lynn Wall at the end of May 2018.  6. Need for pneumococcal vaccine Prevnar 13 given today. Will need Pneumovax 23 in 1 year. 7. Insomnia Discussed risks vs benefits of taking medications for this issue. Try Trazodone 50 mg. Follow.  HPI, Exam and A&P transcribed by Theressa Millard, RMA under direction and in the presence of Miguel Aschoff, MD. I have done the exam and reviewed the chart and it is accurate to the best of my knowledge. Development worker, community has been used and  any errors in dictation or transcription are unintentional. Miguel Aschoff M.D. [facility]

## 2017-01-10 NOTE — Patient Instructions (Signed)
Lynn Wall , Thank you for taking time to come for your Medicare Wellness Visit. I appreciate your ongoing commitment to your health goals. Please review the following plan we discussed and let me know if I can assist you in the future.   Screening recommendations/referrals: Colonoscopy: completed 09/20/11 Mammogram: completed 04/24/11 Bone Density: completed 11/08/14 Recommended yearly ophthalmology/optometry visit for glaucoma screening and checkup Recommended yearly dental visit for hygiene and checkup  Vaccinations: Influenza vaccine: up to date, due 04/2017 Pneumococcal vaccine: pt to f/u with PCP Tdap vaccine: completed 03/27/12, due 03/2022 Shingles vaccine: completed 06/04/13    Advanced directives: Please bring a copy of your POA (Power of [place]) and/or Living Will to your next appointment.   Conditions/risks identified: Fall risk; Recommend increasing water intake 6 glasses of water a day.  Next appointment: None, need to schedule 1 year AWV.   Preventive Care 81 Years and Older, Female Preventive care refers to lifestyle choices and visits with your health care provider that can promote health and wellness. What does preventive care include?  A yearly physical exam. This is also called an annual well check.  Dental exams once or twice a year.  Routine eye exams. Ask your health care provider how often you should have your eyes checked.  Personal lifestyle choices, including:  Daily care of your teeth and gums.  Regular physical activity.  Eating a healthy diet.  Avoiding tobacco and drug use.  Limiting alcohol use.  Practicing safe sex.  Taking low-dose aspirin every day.  Taking vitamin and mineral supplements as recommended by your health care provider. What happens during an annual well check? The services and screenings done by your health care provider during your annual well check will depend on your age, overall health, lifestyle risk  factors, and family history of disease. Counseling  Your health care provider may ask you questions about your:  Alcohol use.  Tobacco use.  Drug use.  Emotional well-being.  Home and relationship well-being.  Sexual activity.  Eating habits.  History of falls.  Memory and ability to understand (cognition).  Work and work Statistician.  Reproductive health. Screening  You may have the following tests or measurements:  Height, weight, and BMI.  Blood pressure.  Lipid and cholesterol levels. These may be checked every 5 years, or more frequently if you are over 10 years old.  Skin check.  Lung cancer screening. You may have this screening every year starting at age 81 if you have a 30-pack-year history of smoking and currently smoke or have quit within the past 81 years.  Fecal occult blood test (FOBT) of the stool. You may have this test every year starting at age 81.  Flexible sigmoidoscopy or colonoscopy. You may have a sigmoidoscopy every 5 years or a colonoscopy every 10 years starting at age 81.  Hepatitis C blood test.  Hepatitis B blood test.  Sexually transmitted disease (STD) testing.  Diabetes screening. This is done by checking your blood sugar (glucose) after you have not eaten for a while (fasting). You may have this done every 1-3 years.  Bone density scan. This is done to screen for osteoporosis. You may have this done starting at age 81.  Mammogram. This may be done every 1-2 years. Talk to your health care provider about how often you should have regular mammograms. Talk with your health care provider about your test results, treatment options, and if necessary, the need for more tests. Vaccines  Your health care provider  may recommend certain vaccines, such as:  Influenza vaccine. This is recommended every year.  Tetanus, diphtheria, and acellular pertussis (Tdap, Td) vaccine. You may need a Td booster every 10 years.  Zoster vaccine. You  may need this after age 81.  Pneumococcal 13-valent conjugate (PCV13) vaccine. One dose is recommended after age 81.  Pneumococcal polysaccharide (PPSV23) vaccine. One dose is recommended after age 81. Talk to your health care provider about which screenings and vaccines you need and how often you need them. This information is not intended to replace advice given to you by your health care provider. Make sure you discuss any questions you have with your health care provider. Document Released: 09/09/2015 Document Revised: 05/02/2016 Document Reviewed: 06/14/2015 Elsevier Interactive Patient Education  2017 [place] Prevention in the Home Falls can cause injuries. They can happen to people of all ages. There are many things you can do to make your home safe and to help prevent falls. What can I do on the outside of my home?  Regularly fix the edges of walkways and driveways and fix any cracks.  Remove anything that might make you trip as you walk through a door, such as a raised step or threshold.  Trim any bushes or trees on the path to your home.  Use bright outdoor lighting.  Clear any walking paths of anything that might make someone trip, such as rocks or tools.  Regularly check to see if handrails are loose or broken. Make sure that both sides of any steps have handrails.  Any raised decks and porches should have guardrails on the edges.  Have any leaves, snow, or ice cleared regularly.  Use sand or salt on walking paths during winter.  Clean up any spills in your garage right away. This includes oil or grease spills. What can I do in the bathroom?  Use night lights.  Install grab bars by the toilet and in the tub and shower. Do not use towel bars as grab bars.  Use non-skid mats or decals in the tub or shower.  If you need to sit down in the shower, use a plastic, non-slip stool.  Keep the floor dry. Clean up any water that spills on the floor as soon as  it happens.  Remove soap buildup in the tub or shower regularly.  Attach bath mats securely with double-sided non-slip rug tape.  Do not have throw rugs and other things on the floor that can make you trip. What can I do in the bedroom?  Use night lights.  Make sure that you have a light by your bed that is easy to reach.  Do not use any sheets or blankets that are too big for your bed. They should not hang down onto the floor.  Have a firm chair that has side arms. You can use this for support while you get dressed.  Do not have throw rugs and other things on the floor that can make you trip. What can I do in the kitchen?  Clean up any spills right away.  Avoid walking on wet floors.  Keep items that you use a lot in easy-to-reach places.  If you need to reach something above you, use a strong step stool that has a grab bar.  Keep electrical cords out of the way.  Do not use floor polish or wax that makes floors slippery. If you must use wax, use non-skid floor wax.  Do not have throw rugs  and other things on the floor that can make you trip. What can I do with my stairs?  Do not leave any items on the stairs.  Make sure that there are handrails on both sides of the stairs and use them. Fix handrails that are broken or loose. Make sure that handrails are as long as the stairways.  Check any carpeting to make sure that it is firmly attached to the stairs. Fix any carpet that is loose or worn.  Avoid having throw rugs at the top or bottom of the stairs. If you do have throw rugs, attach them to the floor with carpet tape.  Make sure that you have a light switch at the top of the stairs and the bottom of the stairs. If you do not have them, ask someone to add them for you. What else can I do to help prevent falls?  Wear shoes that:  Do not have high heels.  Have rubber bottoms.  Are comfortable and fit you well.  Are closed at the toe. Do not wear sandals.  If  you use a stepladder:  Make sure that it is fully opened. Do not climb a closed stepladder.  Make sure that both sides of the stepladder are locked into place.  Ask someone to hold it for you, if possible.  Clearly mark and make sure that you can see:  Any grab bars or handrails.  First and last steps.  Where the edge of each step is.  Use tools that help you move around (mobility aids) if they are needed. These include:  Canes.  Walkers.  Scooters.  Crutches.  Turn on the lights when you go into a dark area. Replace any light bulbs as soon as they burn out.  Set up your furniture so you have a clear path. Avoid moving your furniture around.  If any of your floors are uneven, fix them.  If there are any pets around you, be aware of where they are.  Review your medicines with your doctor. Some medicines can make you feel dizzy. This can increase your chance of falling. Ask your doctor what other things that you can do to help prevent falls. This information is not intended to replace advice given to you by your health care provider. Make sure you discuss any questions you have with your health care provider. Document Released: 06/09/2009 Document Revised: 01/19/2016 Document Reviewed: 09/17/2014 Elsevier Interactive Patient Education  2017 [employer].

## 2017-01-15 ENCOUNTER — Other Ambulatory Visit: Payer: Self-pay | Admitting: Family Medicine

## 2017-01-16 DIAGNOSIS — G4709 Other insomnia: Secondary | ICD-10-CM | POA: Diagnosis not present

## 2017-01-16 DIAGNOSIS — R197 Diarrhea, unspecified: Secondary | ICD-10-CM | POA: Diagnosis not present

## 2017-01-16 DIAGNOSIS — E119 Type 2 diabetes mellitus without complications: Secondary | ICD-10-CM | POA: Diagnosis not present

## 2017-01-16 DIAGNOSIS — E784 Other hyperlipidemia: Secondary | ICD-10-CM | POA: Diagnosis not present

## 2017-01-16 DIAGNOSIS — I10 Essential (primary) hypertension: Secondary | ICD-10-CM | POA: Diagnosis not present

## 2017-01-17 LAB — COMPREHENSIVE METABOLIC PANEL
ALBUMIN: 4.3 g/dL (ref 3.5–4.7)
ALK PHOS: 74 IU/L (ref 39–117)
ALT: 13 IU/L (ref 0–32)
AST: 20 IU/L (ref 0–40)
Albumin/Globulin Ratio: 1.9 (ref 1.2–2.2)
BILIRUBIN TOTAL: 0.4 mg/dL (ref 0.0–1.2)
BUN / CREAT RATIO: 21 (ref 12–28)
BUN: 20 mg/dL (ref 8–27)
CHLORIDE: 104 mmol/L (ref 96–106)
CO2: 23 mmol/L (ref 18–29)
CREATININE: 0.96 mg/dL (ref 0.57–1.00)
Calcium: 9.6 mg/dL (ref 8.7–10.3)
GFR calc Af Amer: 61 mL/min/{1.73_m2} (ref >59)
GFR calc non Af Amer: 53 mL/min/{1.73_m2} — ABNORMAL LOW (ref >59)
Globulin, Total: 2.3 g/dL (ref 1.5–4.5)
Glucose: 112 mg/dL — ABNORMAL HIGH (ref 65–99)
Potassium: 4.5 mmol/L (ref 3.5–5.2)
Sodium: 140 mmol/L (ref 134–144)
Total Protein: 6.6 g/dL (ref 6.0–8.5)

## 2017-01-17 LAB — LIPID PANEL WITH LDL/HDL RATIO
CHOLESTEROL TOTAL: 135 mg/dL (ref 100–199)
HDL: 46 mg/dL (ref >39)
LDL CALC: 69 mg/dL (ref 0–99)
LDl/HDL Ratio: 1.5 ratio (ref 0.0–3.2)
TRIGLYCERIDES: 100 mg/dL (ref 0–149)
VLDL CHOLESTEROL CAL: 20 mg/dL (ref 5–40)

## 2017-01-17 LAB — CBC WITH DIFFERENTIAL/PLATELET
Basophils Absolute: 0 10*3/uL (ref 0.0–0.2)
Basos: 1 %
EOS (ABSOLUTE): 0.2 10*3/uL (ref 0.0–0.4)
Eos: 4 %
HEMOGLOBIN: 11 g/dL — AB (ref 11.1–15.9)
Hematocrit: 34.4 % (ref 34.0–46.6)
IMMATURE GRANS (ABS): 0 10*3/uL (ref 0.0–0.1)
Immature Granulocytes: 0 %
LYMPHS ABS: 1.5 10*3/uL (ref 0.7–3.1)
Lymphs: 31 %
MCH: 29.1 pg (ref 26.6–33.0)
MCHC: 32 g/dL (ref 31.5–35.7)
MCV: 91 fL (ref 79–97)
MONOCYTES: 9 %
Monocytes Absolute: 0.4 10*3/uL (ref 0.1–0.9)
Neutrophils Absolute: 2.7 10*3/uL (ref 1.4–7.0)
Neutrophils: 55 %
PLATELETS: 170 10*3/uL (ref 150–379)
RBC: 3.78 x10E6/uL (ref 3.77–5.28)
RDW: 14.1 % (ref 12.3–15.4)
WBC: 4.9 10*3/uL (ref 3.4–10.8)

## 2017-01-17 LAB — TSH: TSH: 1.05 u[IU]/mL (ref 0.450–4.500)

## 2017-01-22 ENCOUNTER — Encounter: Payer: Self-pay | Admitting: Gastroenterology

## 2017-01-22 ENCOUNTER — Ambulatory Visit: Payer: Medicare Other | Admitting: Gastroenterology

## 2017-01-22 ENCOUNTER — Ambulatory Visit (INDEPENDENT_AMBULATORY_CARE_PROVIDER_SITE_OTHER): Payer: Medicare Other | Admitting: Gastroenterology

## 2017-01-22 VITALS — BP 124/75 | HR 64 | Temp 97.6°F | Ht 63.0 in | Wt 140.8 lb

## 2017-01-22 DIAGNOSIS — K5909 Other constipation: Secondary | ICD-10-CM | POA: Diagnosis not present

## 2017-01-22 MED ORDER — LINACLOTIDE 290 MCG PO CAPS: 290.0000 ug | ORAL_CAPSULE | Freq: Every day | ORAL | 6 refills | Status: DC

## 2017-01-22 NOTE — Progress Notes (Signed)
Jonathon Bellows MD, MRCP(U.K) [address]  [place]  [place], [place] [address]  Main: [phone]  Fax: [phone]   Primary Care Physician: Jerrol Banana., MD  Primary Gastroenterologist:  Dr. Jonathon Bellows   No chief complaint on file.   HPI: Lynn Wall is a 83 y.o. female    Lynn Wall is a 83 y.o. female here to follow up for IBS-C. She was last seen on 11/20/2016  .She has been on meloxicam ,h/o pelvic floor repair , on tramdol occasionally . I diagnosed her with chronic constipation with overflow diarrhea. A flat plate x ray confirmed constipation and I commenced her on linzess 145 mcg, she increased it to 290 mcg, passing small qty of stool daily , prior to which was going once a week. She feels she is comfortable at this point and is much improved. Evaluated by urology and diagnosed with a grade 2 cystocele . She was previously not keen on a colonoscopy and hence decided to do a CT colonography.   Interval history 10/2016-12/2016  CT colonography 11/29/16 showed no polyps or mass, sigmoid diverticulosis noted , renal cysts, 11 mm renal calculus.   She says she was doing fine, was having some fecal incontinece - dropped the dose of linzess to 145 mcg, was commenced on oxycodone for back pain and is back constipated. Last good bowel movement was last Thursday . Consuming a lot of fruits and vegetables.    Current Outpatient Prescriptions  Medication Sig Dispense Refill  . aspirin EC 81 MG tablet Take 81 mg by mouth daily.    Marland Kitchen docusate sodium (STOOL SOFTENER) 100 MG capsule Take 1 capsule (100 mg total) by mouth daily. 10 capsule 0  . enalapril (VASOTEC) 10 MG tablet 1 TABLET, ORAL, DAILY FOR BLOOD PRESSURE 90 tablet 4  . gabapentin (NEURONTIN) 100 MG capsule Take by mouth.    . hydrocortisone (ANUSOL-HC) 25 MG suppository PLACE 1 SUPPOSITORY (25 MG TOTAL) RECTALLY AT BEDTIME.  5  . linaclotide (LINZESS) 290 MCG CAPS capsule Take 1 capsule (290 mcg  total) by mouth daily before breakfast. 30 capsule 6  . meloxicam (MOBIC) 15 MG tablet Take 1 tablet by mouth daily.    . metoprolol tartrate (LOPRESSOR) 25 MG tablet TAKE 1 TABLET TWICE A DAY 180 tablet 3  . omeprazole (PRILOSEC) 20 MG capsule Take 1 capsule (20 mg total) by mouth daily. 90 capsule 3  . ondansetron (ZOFRAN) 4 MG tablet 1/2 to 1 tablet every 6 hours as needed nausea 100 tablet 2  . polyethylene glycol (MIRALAX / GLYCOLAX) packet Take 17 g by mouth daily. 14 each 0  . raloxifene (EVISTA) 60 MG tablet Take by mouth.    . simvastatin (ZOCOR) 40 MG tablet TAKE 1 TABLET BY MOUTH AT BEDTIME (Patient taking differently: TAKE 80 mg TABLET BY MOUTH AT BEDTIME) 90 tablet 3  . traMADol (ULTRAM) 50 MG tablet TAKE 2 TABLETS EVERY 6 HOURS AS NEEDED 100 tablet 0  . traZODone (DESYREL) 50 MG tablet Take 1 tablet (50 mg total) by mouth at bedtime. 30 tablet 12   No current facility-administered medications for this visit.     Allergies as of 01/22/2017  . (No Known Allergies)    ROS:  General: Negative for anorexia, weight loss, fever, chills, fatigue, weakness. ENT: Negative for hoarseness, difficulty swallowing , nasal congestion. CV: Negative for chest pain, angina, palpitations, dyspnea on exertion, peripheral edema.  Respiratory: Negative for dyspnea at rest, dyspnea  on exertion, cough, sputum, wheezing.  GI: See history of present illness. GU:  Negative for dysuria, hematuria, urinary incontinence, urinary frequency, nocturnal urination.  Endo: Negative for unusual weight change.    Physical Examination:   LMP  (LMP Unknown)   General: Well-nourished, well-developed in no acute distress.  Eyes: No icterus. Conjunctivae pink. Mouth: Oropharyngeal mucosa moist and pink , no lesions erythema or exudate. Lungs: Clear to auscultation bilaterally. Non-labored. Heart: Regular rate and rhythm, no murmurs rubs or gallops.  Abdomen: Bowel sounds are normal, nontender, nondistended,  no hepatosplenomegaly or masses, no abdominal bruits or hernia , no rebound or guarding.   Extremities: No lower extremity edema. No clubbing or deformities. Neuro: Alert and oriented x 3.  Grossly intact. Skin: Warm and dry, no jaundice.   Psych: Alert and cooperative, normal mood and affect.   Imaging Studies: No results found.  Assessment and Plan:   Lynn Wall is a 83 y.o. y/o female here for follow up for IBS-C or chronic constipation . It is very likely she has a component of constipation related to tramadol . She was doing ok with low dose linzess but after starting oxycodone for back pain the constipation has returned.  Plan  1. Increase dose of linzess to 290 MCG and if still has constipation she will call me then will call me and I will give her a trial sample of Trulance for 2 weeks    Dr Jonathon Bellows  MD,MRCP [facility]) Follow up as needed

## 2017-03-07 ENCOUNTER — Other Ambulatory Visit: Payer: Self-pay | Admitting: Family Medicine

## 2017-03-07 NOTE — Telephone Encounter (Signed)
See refill request.

## 2017-03-11 ENCOUNTER — Telehealth: Payer: Self-pay | Admitting: Family Medicine

## 2017-03-11 ENCOUNTER — Other Ambulatory Visit: Payer: Self-pay | Admitting: Family Medicine

## 2017-03-11 NOTE — Telephone Encounter (Signed)
Pt needs refill on her tramadol 50 mg   [facility] [place] [facility]  Pt call back is [phone]  Thanks teri

## 2017-03-11 NOTE — Telephone Encounter (Signed)
[facility], looks like on 03/07/17 RX was approved but was never called in-aa

## 2017-03-18 ENCOUNTER — Ambulatory Visit (INDEPENDENT_AMBULATORY_CARE_PROVIDER_SITE_OTHER): Payer: Medicare Other | Admitting: Family Medicine

## 2017-03-18 ENCOUNTER — Encounter: Payer: Self-pay | Admitting: Family Medicine

## 2017-03-18 VITALS — BP 138/78 | HR 62 | Temp 97.7°F | Resp 16 | Wt 135.0 lb

## 2017-03-18 DIAGNOSIS — G8929 Other chronic pain: Secondary | ICD-10-CM | POA: Diagnosis not present

## 2017-03-18 DIAGNOSIS — I10 Essential (primary) hypertension: Secondary | ICD-10-CM | POA: Diagnosis not present

## 2017-03-18 DIAGNOSIS — E119 Type 2 diabetes mellitus without complications: Secondary | ICD-10-CM

## 2017-03-18 NOTE — Progress Notes (Signed)
Subjective:  HPI  Chronic pain- pt is still about the same from before. She and her daughter are requesting a referral back to the pain clinic because they gave her an injection in her neck and it helped her back and she has no back pain. Pt takes tramadol and hydrocodone for her pain but then she gets constipated and then to take care of the constipation, she gets diarrhea. Pt reports that she is nauseated a lot and does not feel like eating. Pt would like to see about getting a lift chair for her home due to her back pain and having a hard time getting in and out of her chair.  Insomnia- started on Trazodone on 01/10/17. Trazodone is making her sleep better, but makes her sleepy in the mornings.    Prior to Admission medications   Medication Sig Start Date End Date Taking? Authorizing Provider  aspirin EC 81 MG tablet Take 81 mg by mouth daily.    [provider]  docusate sodium (STOOL SOFTENER) 100 MG capsule Take 1 capsule (100 mg total) by mouth daily. 05/14/16   Jerrol Banana., MD  enalapril (VASOTEC) 10 MG tablet 1 TABLET, ORAL, DAILY FOR BLOOD PRESSURE 07/30/16   Jerrol Banana., MD  gabapentin (NEURONTIN) 100 MG capsule Take by mouth.    [provider]  hydrocortisone (ANUSOL-HC) 25 MG suppository PLACE 1 SUPPOSITORY (25 MG TOTAL) RECTALLY AT BEDTIME. 07/02/16   [provider]  linaclotide (LINZESS) 290 MCG CAPS capsule Take 1 capsule (290 mcg total) by mouth daily before breakfast. 01/22/17   Jonathon Bellows, MD  meloxicam (MOBIC) 15 MG tablet Take 1 tablet by mouth daily. 04/20/16   [provider]  metoprolol tartrate (LOPRESSOR) 25 MG tablet TAKE 1 TABLET TWICE A DAY 01/15/17   Jerrol Banana., MD  omeprazole (PRILOSEC) 20 MG capsule Take 1 capsule (20 mg total) by mouth daily. 08/23/16   Jerrol Banana., MD  ondansetron [facility]) 4 MG tablet 1/2 to 1 tablet every 6 hours as needed nausea 03/23/16   Jerrol Banana., MD   polyethylene glycol [facility] Floria Raveling) packet Take 17 g by mouth daily. 07/11/16   Jonathon Bellows, MD  raloxifene (EVISTA) 60 MG tablet Take by mouth.    [provider]  simvastatin (ZOCOR) 40 MG tablet TAKE 1 TABLET BY MOUTH AT BEDTIME Patient taking differently: TAKE 80 mg TABLET BY MOUTH AT BEDTIME 05/18/16   Jerrol Banana., MD  traMADol [facility]at[facility]bon) 50 MG tablet TAKE 2 TABLETS EVERY 6 HOURS AS NEEDED 03/07/17   Jerrol Banana., MD  traZODone (DESYREL) 50 MG tablet Take 1 tablet (50 mg total) by mouth at bedtime. 01/10/17   Jerrol Banana., MD    Patient Active Problem List   Diagnosis Date Noted  . Basal cell carcinoma 01/17/2016  . CN (constipation) 05/23/2015  . DDD (degenerative disc disease), lumbar 05/23/2015  . Diabetes mellitus type 2 without retinopathy ([place]) 05/23/2015  . Fatigue 05/23/2015  . Acid reflux 05/23/2015  . LBP (low back pain) 05/23/2015  . [place] of macula 05/23/2015  . Neuralgia neuritis, sciatic nerve 05/23/2015  . Basal cell papilloma 05/23/2015  . Acute urinary retention 03/28/2015  . Lumbar foraminal stenosis 03/14/2015  . Chronic cystitis 03/03/2015  . Cystocele, grade 2 02/18/2015  . History of kidney stones 02/18/2015  . Atrophic vaginitis 02/15/2015  . Recurrent UTI 02/15/2015  . Displacement of lumbar intervertebral  disc without myelopathy 12/01/2014  . Bulge of lumbar disc without myelopathy 12/01/2014  . Lumbar radiculitis 11/10/2014  . Internal hemorrhoids without complication 37/05/6268  . Flutter-fibrillation 03/22/2008  . Paroxysmal supraventricular tachycardia ([place]) 03/22/2008  . Colon, diverticulosis 10/28/2007  . History of colon polyps 10/28/2007  . Malignant neoplasm of skin of parts of face 10/28/2007  . Calculus of kidney 10/18/2007  . Benign essential HTN 10/18/2007  . OP (osteoporosis) 10/18/2007  . Diabetes ([place]) 10/16/2007  . Hypercholesteremia 10/16/2007    Past Medical History:  Diagnosis Date   . Acid reflux   . Allergy   . Cystitis   . DDD (degenerative disc disease), lumbar   . Diabetes mellitus without complication ([place])   . GERD (gastroesophageal reflux disease)   . History of kidney stones   . History of renal stone   . Hyperlipidemia   . Hypertension   . Osteoporosis   . Urinary incontinence   . UTI (urinary tract infection)    currently on antibiotic    Social History   Social History  . Marital status: Married    Spouse name: N/A  . Number of children: N/A  . Years of education: N/A   Occupational History  . Not on file.   Social History Main Topics  . Smoking status: Never Smoker  . Smokeless tobacco: Never Used  . Alcohol use No  . Drug use: No  . Sexual activity: Not Currently   Other Topics Concern  . Not on file   Social History Narrative  . No narrative on file    No Known Allergies  Review of Systems  Constitutional: Positive for malaise/fatigue.  HENT: Negative.   Eyes: Negative.   Respiratory: Negative.   Cardiovascular: Negative.   Gastrointestinal: Positive for constipation, diarrhea and nausea.  Genitourinary: Negative.   Musculoskeletal: Positive for back pain.  Skin: Negative.   Neurological: Negative.   Endo/Heme/Allergies: Negative.   Psychiatric/Behavioral: Negative.     Immunization History  Administered Date(s) Administered  . Influenza, High Dose Seasonal PF 05/23/2015, 08/06/2016  . Pneumococcal Conjugate-13 01/10/2017  . Tdap 03/27/2012    Objective:  BP 138/78 (BP Location: Left Arm, Patient Position: Sitting, Cuff Size: Normal)   Pulse 62   Temp 97.7 F (36.5 C) (Oral)   Resp 16   Wt 135 lb (61.2 kg)   LMP  (LMP Unknown)   BMI 23.91 kg/m   Physical Exam  Constitutional: She is oriented to person, place, and time and well-developed, well-nourished, and in no distress.  HENT:  Head: Normocephalic and atraumatic.  Right Ear: External ear normal.  Left Ear: External ear normal.  Nose: Nose normal.   Eyes: Pupils are equal, round, and reactive to light. Conjunctivae and EOM are normal.  Neck: Normal range of motion. Neck supple.  Cardiovascular: Normal rate, regular rhythm, normal heart sounds and intact distal pulses.   Pulmonary/Chest: Effort normal and breath sounds normal.  Musculoskeletal:  FROM in shoulders.  Neurological: She is alert and oriented to person, place, and time. She has normal reflexes. Gait normal. GCS score is 15.  Skin: Skin is warm and dry.  Psychiatric: Mood, memory, affect and judgment normal.    Lab Results  Component Value Date   WBC 4.9 01/16/2017   HGB 11.0 (L) 01/16/2017   HCT 34.4 01/16/2017   PLT 170 01/16/2017   GLUCOSE 112 (H) 01/16/2017   CHOL 135 01/16/2017   TRIG 100 01/16/2017   HDL 46 01/16/2017   [place]  69 01/16/2017   TSH 1.050 01/16/2017   HGBA1C 6.7 01/10/2017   MICROALBUR 20 01/10/2017    CMP     Component Value Date/Time   NA 140 01/16/2017 0926   K 4.5 01/16/2017 0926   CL 104 01/16/2017 0926   CO2 23 01/16/2017 0926   GLUCOSE 112 (H) 01/16/2017 0926   GLUCOSE 123 (H) 03/07/2015 1226   BUN 20 01/16/2017 0926   CREATININE 0.96 01/16/2017 0926   CALCIUM 9.6 01/16/2017 0926   PROT 6.6 01/16/2017 0926   ALBUMIN 4.3 01/16/2017 0926   AST 20 01/16/2017 0926   ALT 13 01/16/2017 0926   ALKPHOS 74 01/16/2017 0926   BILITOT 0.4 01/16/2017 0926   GFRNONAA 53 (L) 01/16/2017 0926   GFRAA 61 01/16/2017 0926    Assessment and Plan :  1. Benign essential HTN Stable.   2. Other chronic pain Pain still the same. Will write script for lift chair as pt has a hard time getting out of chair due to back pain.  - Ambulatory referral to Physical Medicine Rehab  3. Diabetes mellitus type 2 without retinopathy ([place]) 4.Cervical DDD 5.Shoulder OA  HPI, Exam, and A&P Transcribed under the direction and in the presence of Dareld Mcauliffe L. Cranford Mon, MD  Electronically Signed: Katina Dung, CMA I have done the exam and reviewed the  above chart and it is accurate to the best of my knowledge. Development worker, community has been used in this note in any air is in the dictation or transcription are unintentional.  [facility] 03/18/2017 11:41 AM

## 2017-03-18 NOTE — Patient Instructions (Addendum)
Cut trazodone in half about an hour or 2 before bedtime and see how this helps with sleepiness.

## 2017-04-10 DIAGNOSIS — D225 Melanocytic nevi of trunk: Secondary | ICD-10-CM | POA: Diagnosis not present

## 2017-04-10 DIAGNOSIS — Z85828 Personal history of other malignant neoplasm of skin: Secondary | ICD-10-CM | POA: Diagnosis not present

## 2017-04-10 DIAGNOSIS — D692 Other nonthrombocytopenic purpura: Secondary | ICD-10-CM | POA: Diagnosis not present

## 2017-04-10 DIAGNOSIS — L821 Other seborrheic keratosis: Secondary | ICD-10-CM | POA: Diagnosis not present

## 2017-04-22 ENCOUNTER — Telehealth: Payer: Self-pay

## 2017-04-22 MED ORDER — LISINOPRIL 10 MG PO TABS: 10.0000 mg | ORAL_TABLET | Freq: Every day | ORAL | 3 refills | Status: DC

## 2017-04-22 NOTE — Telephone Encounter (Signed)
Pharmacy faxed over a request to switch patient from Enalapril to Lisinopril, due to insurance-insurance prefers Lisinopril and patient will save money with the change. Please review=-aa

## 2017-04-22 NOTE — Telephone Encounter (Signed)
Change to lisinopril 10mg  daily

## 2017-04-22 NOTE — Telephone Encounter (Signed)
RX sent in, pharmacy advised, medication updated in the chart-aa

## 2017-05-25 ENCOUNTER — Other Ambulatory Visit: Payer: Self-pay | Admitting: Family Medicine

## 2017-05-25 DIAGNOSIS — E7800 Pure hypercholesterolemia, unspecified: Secondary | ICD-10-CM

## 2017-06-04 ENCOUNTER — Other Ambulatory Visit: Payer: Self-pay | Admitting: Physical Medicine and Rehabilitation

## 2017-06-04 DIAGNOSIS — M5136 Other intervertebral disc degeneration, lumbar region: Secondary | ICD-10-CM | POA: Diagnosis not present

## 2017-06-04 DIAGNOSIS — M5416 Radiculopathy, lumbar region: Secondary | ICD-10-CM

## 2017-06-04 DIAGNOSIS — M62830 Muscle spasm of back: Secondary | ICD-10-CM | POA: Diagnosis not present

## 2017-06-04 DIAGNOSIS — M48062 Spinal stenosis, lumbar region with neurogenic claudication: Secondary | ICD-10-CM | POA: Diagnosis not present

## 2017-06-05 ENCOUNTER — Ambulatory Visit (INDEPENDENT_AMBULATORY_CARE_PROVIDER_SITE_OTHER): Payer: Medicare Other | Admitting: Gastroenterology

## 2017-06-05 ENCOUNTER — Encounter (INDEPENDENT_AMBULATORY_CARE_PROVIDER_SITE_OTHER): Payer: Self-pay

## 2017-06-05 ENCOUNTER — Encounter: Payer: Self-pay | Admitting: Gastroenterology

## 2017-06-05 VITALS — BP 154/75 | HR 60 | Temp 97.6°F | Ht 63.0 in | Wt 132.8 lb

## 2017-06-05 DIAGNOSIS — K581 Irritable bowel syndrome with constipation: Secondary | ICD-10-CM | POA: Diagnosis not present

## 2017-06-05 MED ORDER — LINACLOTIDE 72 MCG PO CAPS: 72.0000 ug | ORAL_CAPSULE | Freq: Every day | ORAL | 6 refills | Status: DC

## 2017-06-05 NOTE — Progress Notes (Signed)
Jonathon Bellows MD, MRCP(U.K) [address]  [place]  [place], [place] [address]  Main: [phone]  Fax: [phone]   Primary Care Physician: Jerrol Banana., MD  Primary Gastroenterologist:  Dr. Jonathon Bellows   No chief complaint on file.   HPI: Lynn Wall is a 83 y.o. female  She is here today to follow up for IBS-C.   Summary of history :  She was last seen on 12/2016  .She has been on meloxicam ,h/o pelvic floor repair , on tramdol occasionally . I diagnosed her with chronic constipation with overflow diarrhea. A flat plate x ray confirmed constipation and I commenced her on linzess 145 mcg, she increased it to 290 mcg, passing small qty of stool daily , prior to which was going once a week. She feels she is comfortable at this point and is much improved. Evaluated by urology and diagnosed with a grade 2 cystocele . She was previously not keen on a colonoscopy and hence decided to do a CT colonography. CT colonography 11/29/16 showed no polyps or mass, sigmoid diverticulosis noted , renal cysts, 11 mm renal calculus.   Interval history   01/22/2017-  06/05/2017    She says she was doing fine, was having some fecal incontinece - dropped the dose of linzess to 145 mcg, was commenced on oxycodone for back pain and is back constipated. Last good bowel movement was last Thursday . Consuming a lot of fruits and vegetables.      Current Outpatient Prescriptions  Medication Sig Dispense Refill  . aspirin EC 81 MG tablet Take 81 mg by mouth daily.    Marland Kitchen docusate sodium (STOOL SOFTENER) 100 MG capsule Take 1 capsule (100 mg total) by mouth daily. 10 capsule 0  . gabapentin (NEURONTIN) 100 MG capsule Take by mouth.    . hydrocortisone (ANUSOL-HC) 25 MG suppository PLACE 1 SUPPOSITORY (25 MG TOTAL) RECTALLY AT BEDTIME.  5  . linaclotide (LINZESS) 290 MCG CAPS capsule Take 1 capsule (290 mcg total) by mouth daily before breakfast. 30 capsule 6  . lisinopril  (PRINIVIL,ZESTRIL) 10 MG tablet Take 1 tablet (10 mg total) by mouth daily. 90 tablet 3  . meloxicam (MOBIC) 15 MG tablet Take 1 tablet by mouth daily.    . metoprolol tartrate (LOPRESSOR) 25 MG tablet TAKE 1 TABLET TWICE A DAY 180 tablet 3  . omeprazole (PRILOSEC) 20 MG capsule Take 1 capsule (20 mg total) by mouth daily. 90 capsule 3  . ondansetron (ZOFRAN) 4 MG tablet 1/2 to 1 tablet every 6 hours as needed nausea 100 tablet 2  . polyethylene glycol (MIRALAX / GLYCOLAX) packet Take 17 g by mouth daily. 14 each 0  . raloxifene (EVISTA) 60 MG tablet Take by mouth.    . simvastatin (ZOCOR) 40 MG tablet TAKE 1 TABLET BY MOUTH AT BEDTIME 90 tablet 3  . traMADol (ULTRAM) 50 MG tablet TAKE 2 TABLETS EVERY 6 HOURS AS NEEDED 100 tablet 2  . traZODone (DESYREL) 50 MG tablet Take 1 tablet (50 mg total) by mouth at bedtime. 30 tablet 12   No current facility-administered medications for this visit.     Allergies as of 06/05/2017  . (No Known Allergies)    ROS:  General: Negative for anorexia, weight loss, fever, chills, fatigue, weakness. ENT: Negative for hoarseness, difficulty swallowing , nasal congestion. CV: Negative for chest pain, angina, palpitations, dyspnea on exertion, peripheral edema.  Respiratory: Negative for dyspnea at rest, dyspnea on exertion, cough,  sputum, wheezing.  GI: See history of present illness. GU:  Negative for dysuria, hematuria, urinary incontinence, urinary frequency, nocturnal urination.  Endo: Negative for unusual weight change.    Physical Examination:   LMP  (LMP Unknown)   General: Well-nourished, well-developed in no acute distress.  Eyes: No icterus. Conjunctivae pink. Mouth: Oropharyngeal mucosa moist and pink , no lesions erythema or exudate. Lungs: Clear to auscultation bilaterally. Non-labored. Heart: Regular rate and rhythm, no murmurs rubs or gallops.  Abdomen: Bowel sounds are normal, nontender, nondistended, no hepatosplenomegaly or masses,  no abdominal bruits or hernia , no rebound or guarding.   Extremities: No lower extremity edema. No clubbing or deformities. Neuro: Alert and oriented x 3.  Grossly intact. Skin: Warm and dry, no jaundice.   Psych: Alert and cooperative, normal mood and affect.   Imaging Studies: No results found.  Assessment and Plan:   Lynn Wall is a 83 y.o. y/o female  here for follow up for IBS-C or chronic constipation . It is very likely she has a component of constipation related to tramadol use, cystocele . Having diarrhea on 290 mcglinzess. Suggest decrease dose to 72 mcg and stop miralax  Dr Jonathon Bellows  MD,MRCP [facility]) Follow up in PRN

## 2017-06-10 ENCOUNTER — Ambulatory Visit
Admission: RE | Admit: 2017-06-10 | Discharge: 2017-06-10 | Disposition: A | Discharge status: Home / Self Care | Payer: Medicare Other | Source: Ambulatory Visit | Attending: Physical Medicine and Rehabilitation | Admitting: Physical Medicine and Rehabilitation

## 2017-06-10 DIAGNOSIS — M47817 Spondylosis without myelopathy or radiculopathy, lumbosacral region: Secondary | ICD-10-CM | POA: Diagnosis not present

## 2017-06-10 DIAGNOSIS — M5416 Radiculopathy, lumbar region: Secondary | ICD-10-CM

## 2017-06-10 DIAGNOSIS — M48061 Spinal stenosis, lumbar region without neurogenic claudication: Secondary | ICD-10-CM | POA: Diagnosis not present

## 2017-06-10 DIAGNOSIS — M5116 Intervertebral disc disorders with radiculopathy, lumbar region: Secondary | ICD-10-CM | POA: Insufficient documentation

## 2017-06-10 DIAGNOSIS — M545 Low back pain: Secondary | ICD-10-CM | POA: Diagnosis not present

## 2017-06-11 DIAGNOSIS — M62830 Muscle spasm of back: Secondary | ICD-10-CM | POA: Diagnosis not present

## 2017-06-11 DIAGNOSIS — M5416 Radiculopathy, lumbar region: Secondary | ICD-10-CM | POA: Diagnosis not present

## 2017-06-11 DIAGNOSIS — M48062 Spinal stenosis, lumbar region with neurogenic claudication: Secondary | ICD-10-CM | POA: Diagnosis not present

## 2017-06-11 DIAGNOSIS — M5136 Other intervertebral disc degeneration, lumbar region: Secondary | ICD-10-CM | POA: Diagnosis not present

## 2017-06-19 ENCOUNTER — Other Ambulatory Visit: Payer: Self-pay | Admitting: Gastroenterology

## 2017-06-24 ENCOUNTER — Ambulatory Visit (INDEPENDENT_AMBULATORY_CARE_PROVIDER_SITE_OTHER): Payer: Medicare Other | Admitting: Family Medicine

## 2017-06-24 ENCOUNTER — Encounter: Payer: Self-pay | Admitting: Family Medicine

## 2017-06-24 VITALS — BP 182/92 | HR 62 | Temp 97.6°F | Resp 16 | Wt 131.0 lb

## 2017-06-24 DIAGNOSIS — G8929 Other chronic pain: Secondary | ICD-10-CM | POA: Diagnosis not present

## 2017-06-24 DIAGNOSIS — K529 Noninfective gastroenteritis and colitis, unspecified: Secondary | ICD-10-CM | POA: Diagnosis not present

## 2017-06-24 DIAGNOSIS — I10 Essential (primary) hypertension: Secondary | ICD-10-CM | POA: Diagnosis not present

## 2017-06-24 DIAGNOSIS — R300 Dysuria: Secondary | ICD-10-CM

## 2017-06-24 DIAGNOSIS — Z23 Encounter for immunization: Secondary | ICD-10-CM

## 2017-06-24 LAB — POCT URINALYSIS DIPSTICK
Bilirubin, UA: NEGATIVE
Blood, UA: NEGATIVE
GLUCOSE UA: NEGATIVE
Ketones, UA: NEGATIVE
LEUKOCYTES UA: NEGATIVE
NITRITE UA: NEGATIVE
PROTEIN UA: NEGATIVE
SPEC GRAV UA: 1.02 (ref 1.010–1.025)
UROBILINOGEN UA: 0.2 U/dL
pH, UA: 6 (ref 5.0–8.0)

## 2017-06-24 MED ORDER — HYDROCORTISONE ACE-PRAMOXINE 1-1 % RE FOAM: 1.0000 | Freq: Two times a day (BID) | RECTAL | 1 refills | Status: DC

## 2017-06-24 NOTE — Progress Notes (Signed)
Patient: Lynn Wall Female    DOB: September 13, 1926   83 y.o.   MRN: [id number] Visit Date: 06/24/2017  Today's Provider: [name], MD   Chief Complaint  Patient presents with  . Hypertension  . Pain   Subjective:    HPI   Hypertension, follow-up:  BP Readings from Last 3 Encounters:  06/24/17 (!) 182/92  06/05/17 (!) 154/75  03/18/17 138/78    She was last seen for hypertension 6 months ago.  BP at that visit was 154/75. Management since that visit includes none. She reports good compliance with treatment. She is not having side effects.  She is not exercising. She is not adherent to low salt diet.   Outside blood pressures are being checked but does not remember but thinks they were ok. Patient denies chest pain, chest pressure/discomfort, claudication, dyspnea, exertional chest pressure/discomfort, irregular heart beat, lower extremity edema, near-syncope, orthopnea, palpitations, paroxysmal nocturnal dyspnea, syncope and tachypnea.   Wt Readings from Last 3 Encounters:  06/24/17 131 lb (59.4 kg)  06/05/17 132 lb 12.8 oz (60.2 kg)  03/18/17 135 lb (61.2 kg)    ------------------------------------------------------------------------ Pt is upset today. She is not feeling well. She is still dealing with the nausea, abdominal pain, decreased appetite and bloating. She is seeing Dr. [name] and they have been working with her linzess but the she gets diarrhea. She also has back pain and is getting injections for this. She is scheduled to get an epidural tomorrow. Pt would like to get her flu vaccine today but she is also have burning and she is unsure if it is urinary related or if from the diarrhea she is irritated.      No Known Allergies   Current Outpatient Prescriptions:  .  aspirin EC 81 MG tablet, Take 81 mg by mouth daily., Disp: , Rfl:  .  docusate sodium (STOOL SOFTENER) 100 MG capsule, Take 1 capsule (100 mg total) by mouth daily., Disp: 10  capsule, Rfl: 0 .  gabapentin (NEURONTIN) 100 MG capsule, Take by mouth., Disp: , Rfl:  .  hydrocortisone (ANUSOL-HC) 25 MG suppository, PLACE 1 SUPPOSITORY (25 MG TOTAL) RECTALLY AT BEDTIME., Disp: , Rfl: 5 .  linaclotide (LINZESS) 72 MCG capsule, Take 1 capsule (72 mcg total) by mouth daily before breakfast., Disp: 60 capsule, Rfl: 6 .  lisinopril (PRINIVIL,ZESTRIL) 10 MG tablet, Take 1 tablet (10 mg total) by mouth daily., Disp: 90 tablet, Rfl: 3 .  meloxicam (MOBIC) 15 MG tablet, Take 1 tablet by mouth daily., Disp: , Rfl:  .  metoprolol tartrate (LOPRESSOR) 25 MG tablet, TAKE 1 TABLET TWICE A DAY, Disp: 180 tablet, Rfl: 3 .  omeprazole (PRILOSEC) 20 MG capsule, Take 1 capsule (20 mg total) by mouth daily., Disp: 90 capsule, Rfl: 3 .  ondansetron (ZOFRAN) 4 MG tablet, 1/2 to 1 tablet every 6 hours as needed nausea, Disp: 100 tablet, Rfl: 2 .  polyethylene glycol (MIRALAX / GLYCOLAX) packet, Take 17 g by mouth daily., Disp: 14 each, Rfl: 0 .  raloxifene (EVISTA) 60 MG tablet, Take by mouth., Disp: , Rfl:  .  simvastatin (ZOCOR) 40 MG tablet, TAKE 1 TABLET BY MOUTH AT BEDTIME, Disp: 90 tablet, Rfl: 3 .  traMADol (ULTRAM) 50 MG tablet, TAKE 2 TABLETS EVERY 6 HOURS AS NEEDED, Disp: 100 tablet, Rfl: 2 .  traZODone (DESYREL) 50 MG tablet, Take 1 tablet (50 mg total) by mouth at bedtime., Disp: 30 tablet, Rfl: 12  Review  of Systems  Constitutional: Positive for appetite change.  HENT: Negative.   Eyes: Negative.   Respiratory: Negative.   Cardiovascular: Negative.   Gastrointestinal: Positive for abdominal pain, diarrhea and nausea.  Endocrine: Negative.   Genitourinary: Negative.   Musculoskeletal: Positive for back pain.  Skin: Negative.   Allergic/Immunologic: Negative.   Neurological: Negative.   Hematological: Negative.   Psychiatric/Behavioral: Negative.     Social History  Substance Use Topics  . Smoking status: Never Smoker  . Smokeless tobacco: Never Used  . Alcohol use No     Objective:   BP (!) 182/92 (BP Location: Left Arm, Patient Position: Sitting, Cuff Size: Normal)   Pulse 62   Temp 97.6 F (36.4 C) (Oral)   Resp 16   Wt 131 lb (59.4 kg)   LMP  (LMP Unknown)   BMI 23.21 kg/m  Vitals:   06/24/17 1153  BP: (!) 182/92  Pulse: 62  Resp: 16  Temp: 97.6 F (36.4 C)  TempSrc: Oral  Weight: 131 lb (59.4 kg)     Physical Exam  Constitutional: She is oriented to person, place, and time. She appears well-developed and well-nourished.  HENT:  Head: Normocephalic and atraumatic.  Right Ear: External ear normal.  Left Ear: External ear normal.  Nose: Nose normal.  Eyes: Conjunctivae are normal. No scleral icterus.  Neck: No thyromegaly present.  Cardiovascular: Normal rate, regular rhythm and normal heart sounds.   Pulmonary/Chest: Effort normal.  Abdominal: Soft. She exhibits no distension and no mass. There is no tenderness. There is no rebound and no guarding.  Neurological: She is alert and oriented to person, place, and time.  Skin: Skin is warm and dry.  Psychiatric: She has a normal mood and affect. Her behavior is normal. Judgment and thought content normal.        Assessment & Plan:     1. Benign essential HTN   2. Other chronic pain   3. Proctitis/colitis More than 50% of 25 minute spent in counseling. - hydrocortisone-pramoxine (PROCTOFOAM-HC) rectal foam; Place 1 applicator rectally 2 (two) times daily.  Dispense: 10 g; Refill: 1  4. Dysuria  - POCT urinalysis dipstick 5.LS Radiculopathy 6.s.p TAH  I have done the exam and reviewed the chart and it is accurate to the best of my knowledge. Development worker, community has been used and  any errors in dictation or transcription are unintentional. Miguel Aschoff M.D. [place], MD  [facility]

## 2017-06-25 DIAGNOSIS — M5136 Other intervertebral disc degeneration, lumbar region: Secondary | ICD-10-CM | POA: Diagnosis not present

## 2017-06-25 DIAGNOSIS — M48062 Spinal stenosis, lumbar region with neurogenic claudication: Secondary | ICD-10-CM | POA: Diagnosis not present

## 2017-06-25 DIAGNOSIS — M5416 Radiculopathy, lumbar region: Secondary | ICD-10-CM | POA: Diagnosis not present

## 2017-06-27 ENCOUNTER — Other Ambulatory Visit: Payer: Self-pay | Admitting: Emergency Medicine

## 2017-06-27 MED ORDER — HYDROCORTISONE 2.5 % RE CREA: 1.0000 "application " | TOPICAL_CREAM | Freq: Two times a day (BID) | RECTAL | 0 refills | Status: DC

## 2017-07-23 DIAGNOSIS — Z23 Encounter for immunization: Secondary | ICD-10-CM | POA: Diagnosis not present

## 2017-08-16 ENCOUNTER — Other Ambulatory Visit: Payer: Self-pay | Admitting: Family Medicine

## 2017-08-16 NOTE — Telephone Encounter (Signed)
[facility] for the following medication. Thanks CC ° °traMADol (ULTRAM) 50 MG tablet  ° °

## 2017-08-21 ENCOUNTER — Telehealth: Payer: Self-pay | Admitting: Family Medicine

## 2017-08-21 MED ORDER — TRAMADOL HCL 50 MG PO TABS: 50.0000 mg | ORAL_TABLET | Freq: Four times a day (QID) | ORAL | 2 refills | Status: DC | PRN

## 2017-08-21 NOTE — Telephone Encounter (Signed)
Daughter Rich Fuchs, RMA

## 2017-08-21 NOTE — Telephone Encounter (Signed)
Pt's daughter called pt needs new refill on her Tramadol.  [facility] should have faxed a request  Daughters call back is [phone]

## 2017-10-14 DIAGNOSIS — H353131 Nonexudative age-related macular degeneration, bilateral, early dry stage: Secondary | ICD-10-CM | POA: Diagnosis not present

## 2017-10-14 LAB — HM DIABETES EYE EXAM

## 2017-12-22 IMAGING — US US ABDOMEN COMPLETE
1 series · 13 of 25 positions shown · non-contrast
Comparison: Abdominal series 05/28/2016. MRI 07/28/2015. Ultrasound
01/14/2008.

CLINICAL DATA: Abdominal pain.

EXAM:
ABDOMEN ULTRASOUND COMPLETE

[Series 1: us abdomen complete · 0.20mm/px · 13 of 118 slices shown]
[im 1/118]
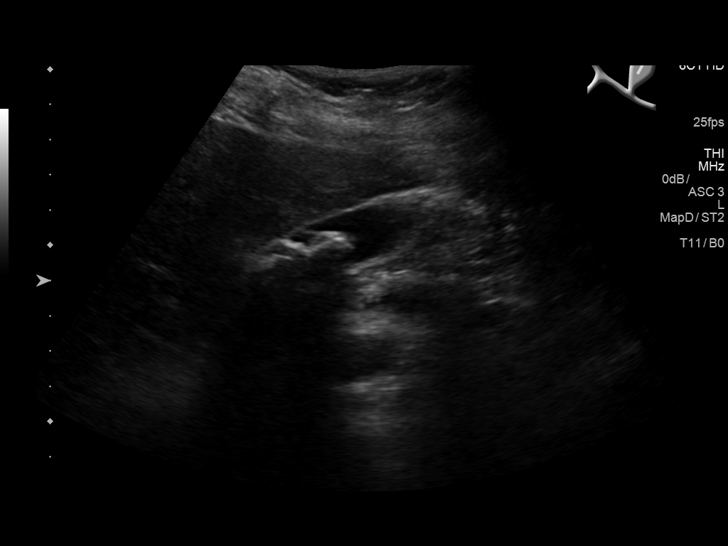
[im 10/118]
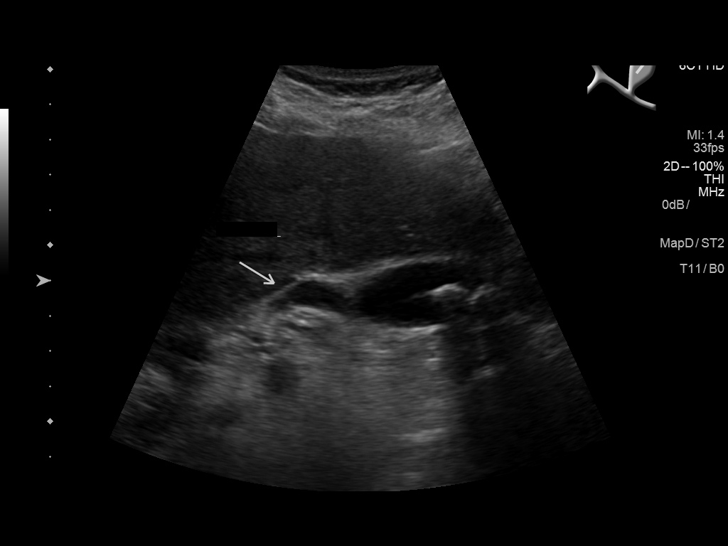
[im 20/118]
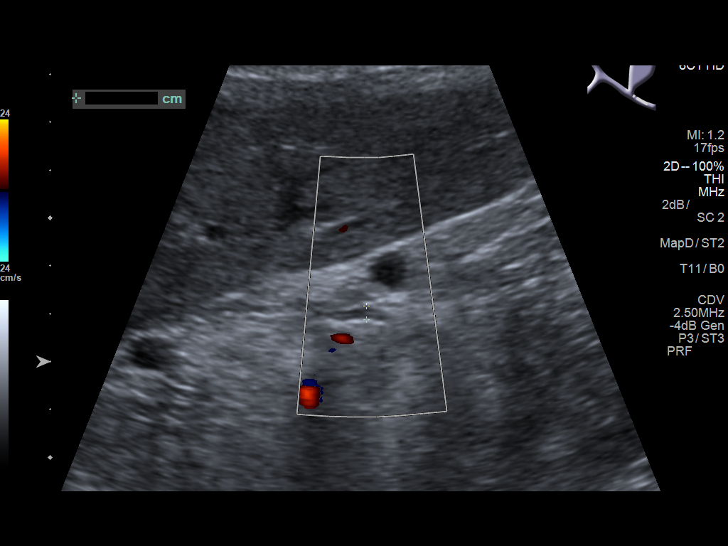
[im 30/118]
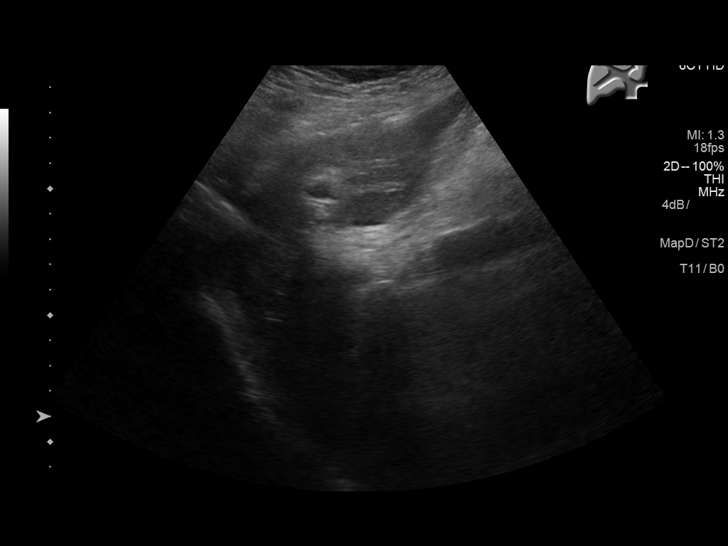
[im 40/118]
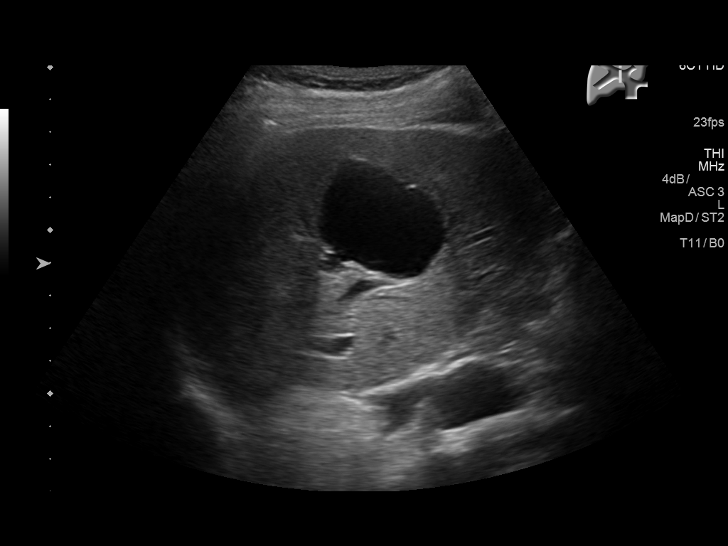
[im 49/118]
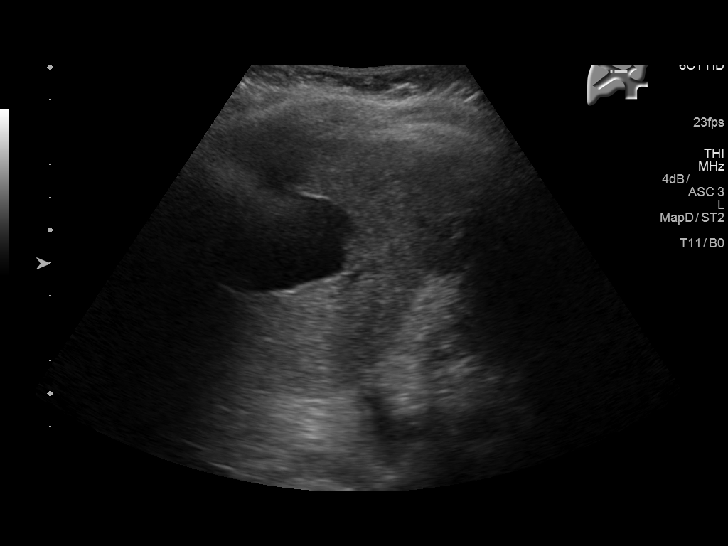
[im 59/118]
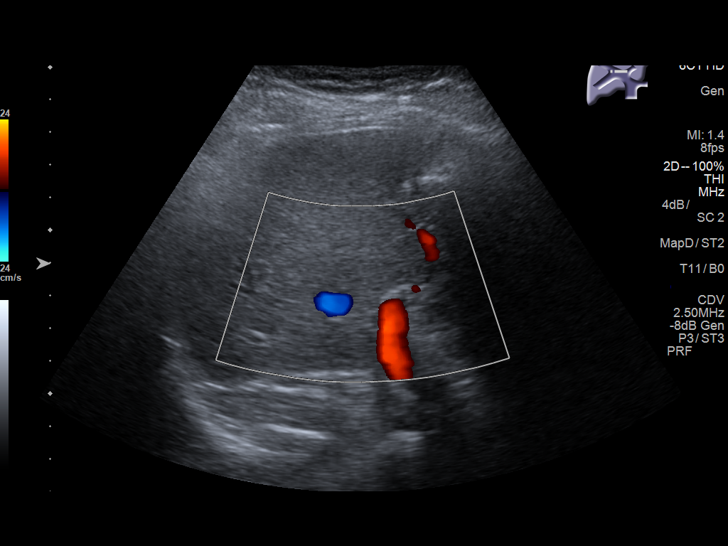
[im 69/118]
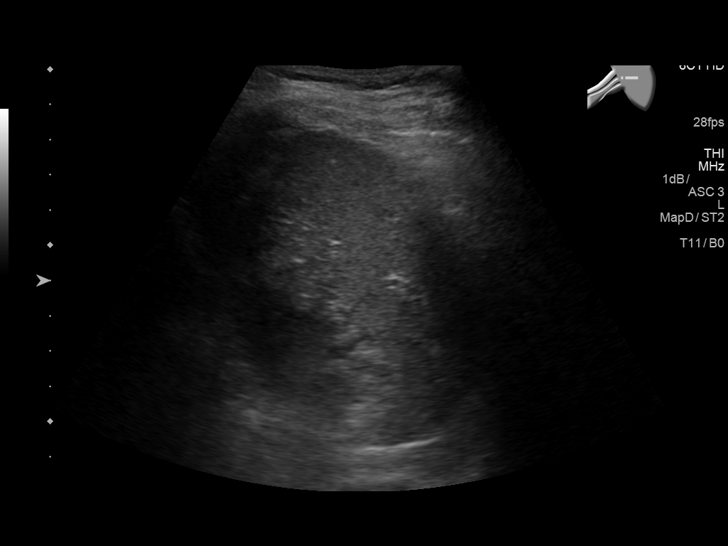
[im 79/118]
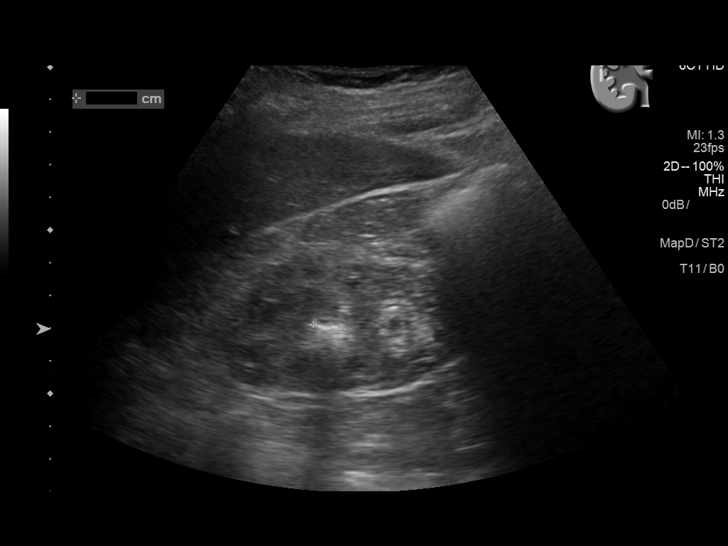
[im 88/118]
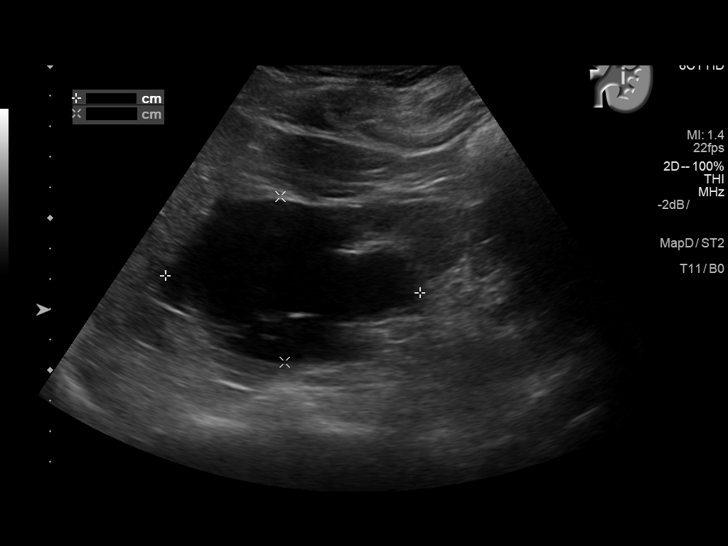
[im 98/118]
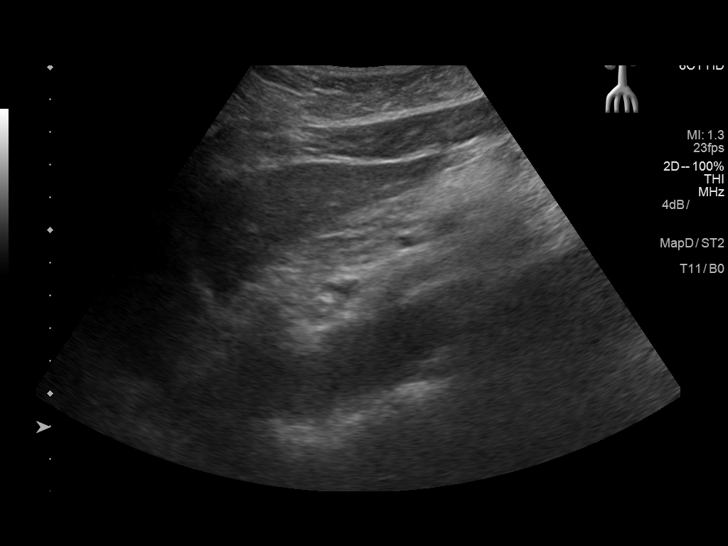
[im 108/118]
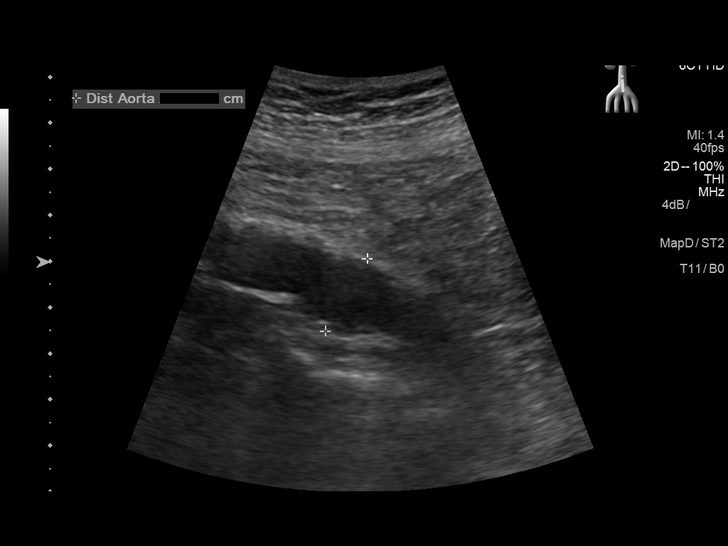
[im 118/118]
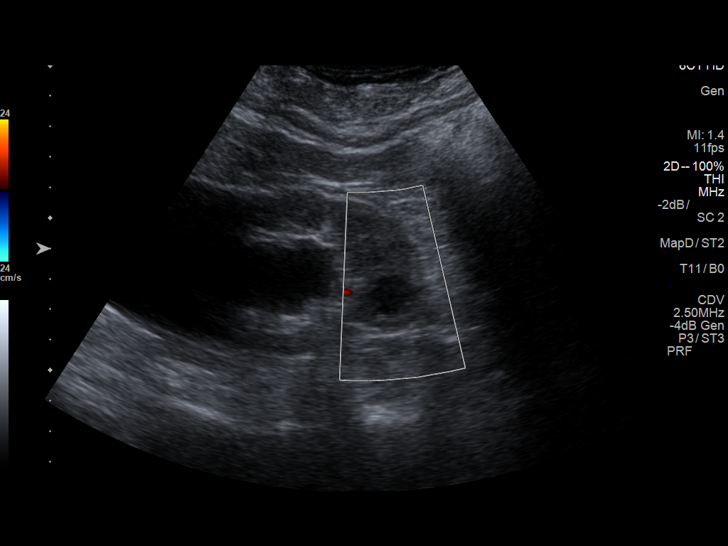

[13 of 25 positions shown; findings below may reference images not displayed]

FINDINGS: Gallbladder: Multiple gallstones. Gallbladder wall thickness normal.
Negative Murphy sign. No pericholecystic fluid collection.

Common bile duct: Diameter: 2.8 mm

Liver: Hepatic echotexture is normal. 4.1 x 3.0 x 4.5 cm septated
cyst is in the right hepatic lobe. This is most likely benign.

IVC: No abnormality visualized.

Pancreas: Visualized portion unremarkable.

Spleen: Size and appearance within normal limits.

Right Kidney: Length: 9.3 cm. Echogenicity within normal limits. No
mass or hydronephrosis visualized. 9 mm nonobstructing stone.

Left Kidney: Length: 9.2 cm. Echogenicity within normal limits. No
hydronephrosis visualized. 8.4 x 5.4 x 8.4 cm complex cyst with thin
septations. Similar findings noted on prior exam.

Abdominal aorta: No aneurysm visualized.

Other findings: None.
IMPRESSION: 1. Multiple gallstones. No evidence of cholecystitis or biliary
distention.

2. 9 mm nonobstructing stone right kidney. Complex cyst with thin
septations again noted in the left kidney without significant change
from prior exam. Although there has been slight interval growth from
6666 this is most likely benign .

## 2017-12-23 ENCOUNTER — Ambulatory Visit: Payer: Self-pay | Admitting: Family Medicine

## 2018-01-02 ENCOUNTER — Other Ambulatory Visit: Payer: Self-pay | Admitting: Family Medicine

## 2018-01-02 MED ORDER — TRAMADOL HCL 50 MG PO TABS: 50.0000 mg | ORAL_TABLET | Freq: Four times a day (QID) | ORAL | 3 refills | Status: DC | PRN

## 2018-01-02 NOTE — Telephone Encounter (Signed)
[facility] the following medication. Thanks [facility]  traMADol (ULTRAM) 50 MG tablet

## 2018-01-14 ENCOUNTER — Encounter: Payer: Self-pay | Admitting: Family Medicine

## 2018-01-14 ENCOUNTER — Ambulatory Visit (INDEPENDENT_AMBULATORY_CARE_PROVIDER_SITE_OTHER): Payer: Medicare Other | Admitting: Family Medicine

## 2018-01-14 VITALS — BP 144/72 | HR 62 | Temp 98.6°F | Resp 16 | Wt 132.0 lb

## 2018-01-14 DIAGNOSIS — F329 Major depressive disorder, single episode, unspecified: Secondary | ICD-10-CM | POA: Diagnosis not present

## 2018-01-14 DIAGNOSIS — Z9181 History of falling: Secondary | ICD-10-CM | POA: Diagnosis not present

## 2018-01-14 DIAGNOSIS — G8929 Other chronic pain: Secondary | ICD-10-CM | POA: Diagnosis not present

## 2018-01-14 DIAGNOSIS — M25512 Pain in left shoulder: Secondary | ICD-10-CM | POA: Diagnosis not present

## 2018-01-14 DIAGNOSIS — M25511 Pain in right shoulder: Secondary | ICD-10-CM | POA: Diagnosis not present

## 2018-01-14 DIAGNOSIS — G4709 Other insomnia: Secondary | ICD-10-CM | POA: Diagnosis not present

## 2018-01-14 DIAGNOSIS — I10 Essential (primary) hypertension: Secondary | ICD-10-CM | POA: Diagnosis not present

## 2018-01-14 MED ORDER — MIRTAZAPINE 30 MG PO TABS: 30.0000 mg | ORAL_TABLET | Freq: Every day | ORAL | 11 refills | Status: DC

## 2018-01-14 MED ORDER — GABAPENTIN 100 MG PO CAPS: 100.0000 mg | ORAL_CAPSULE | Freq: Two times a day (BID) | ORAL | 5 refills | Status: DC

## 2018-01-14 NOTE — Progress Notes (Signed)
Patient: Lynn Wall Female    DOB: June 12, 1927   84 y.o.   MRN: [id number] Visit Date: 01/14/2018  Today's Provider: [name], MD   Chief Complaint  Patient presents with  . Hypertension   Subjective:    HPI  Hypertension, follow-up:  BP Readings from Last 3 Encounters:  01/14/18 (!) 144/72  06/24/17 (!) 182/92  06/05/17 (!) 154/75    She was last seen for hypertension 6 months ago.  BP at that visit was 182/92. Management since that visit includes none. She reports good compliance with treatment. She is not having side effects.  She is exercising. She is adherent to low salt diet.   Outside blood pressures are being checked but unsure of the numbers.  Patient denies chest pain, chest pressure/discomfort, claudication, dyspnea, exertional chest pressure/discomfort, fatigue, irregular heart beat, lower extremity edema, near-syncope, orthopnea, palpitations, paroxysmal nocturnal dyspnea, syncope and tachypnea.    Wt Readings from Last 3 Encounters:  01/14/18 132 lb (59.9 kg)  06/24/17 131 lb (59.4 kg)  06/05/17 132 lb 12.8 oz (60.2 kg)   ------------------------------------------------------------------------ Pt reports that she is going through a tough season of life. She does not feel depressed but has a lot on her plate with her husbands heath.  She complains of ongoing back pain and insomnia.     Depression screen [facility] 2/9 01/15/2018 01/14/2018 01/10/2017 01/10/2017 11/07/2015  Decreased Interest 1 0 0 0 0  Down, Depressed, Hopeless 1 1 0 0 0  PHQ - 2 Score 2 1 0 0 0  Altered sleeping 1 - 3 - -  Tired, decreased energy 3 - 3 - -  Change in appetite 3 - 1 - -  Feeling bad or failure about yourself  0 - 0 - -  Trouble concentrating 2 - 0 - -  Moving slowly or fidgety/restless 1 - 0 - -  Suicidal thoughts 1 - 0 - -  PHQ-9 Score 13 - 7 - -  Difficult doing work/chores Very difficult - - - -     No Known Allergies   Current Outpatient  Medications:  .  aspirin EC 81 MG tablet, Take 81 mg by mouth daily., Disp: , Rfl:  .  docusate sodium (STOOL SOFTENER) 100 MG capsule, Take 1 capsule (100 mg total) by mouth daily., Disp: 10 capsule, Rfl: 0 .  linaclotide (LINZESS) 72 MCG capsule, Take 1 capsule (72 mcg total) by mouth daily before breakfast. (Patient taking differently: Take 290 mcg by mouth daily before breakfast. ), Disp: 60 capsule, Rfl: 6 .  lisinopril (PRINIVIL,ZESTRIL) 10 MG tablet, Take 1 tablet (10 mg total) by mouth daily., Disp: 90 tablet, Rfl: 3 .  metoprolol tartrate (LOPRESSOR) 25 MG tablet, TAKE 1 TABLET TWICE A DAY, Disp: 180 tablet, Rfl: 3 .  ondansetron (ZOFRAN) 4 MG tablet, 1/2 to 1 tablet every 6 hours as needed nausea, Disp: 100 tablet, Rfl: 2 .  polyethylene glycol (MIRALAX / GLYCOLAX) packet, Take 17 g by mouth daily., Disp: 14 each, Rfl: 0 .  simvastatin (ZOCOR) 40 MG tablet, TAKE 1 TABLET BY MOUTH AT BEDTIME, Disp: 90 tablet, Rfl: 3 .  traMADol (ULTRAM) 50 MG tablet, Take 1 tablet (50 mg total) by mouth every 6 (six) hours as needed., Disp: 100 tablet, Rfl: 3 .  traZODone (DESYREL) 50 MG tablet, Take 1 tablet (50 mg total) by mouth at bedtime., Disp: 30 tablet, Rfl: 12 .  gabapentin (NEURONTIN) 100 MG capsule, Take by  mouth., Disp: , Rfl:  .  hydrocortisone (ANUSOL-HC) 2.5 % rectal cream, Place 1 application rectally 2 (two) times daily. (Patient not taking: Reported on 01/14/2018), Disp: 30 g, Rfl: 0 .  hydrocortisone (ANUSOL-HC) 25 MG suppository, PLACE 1 SUPPOSITORY (25 MG TOTAL) RECTALLY AT BEDTIME., Disp: , Rfl: 5 .  hydrocortisone-pramoxine (PROCTOFOAM-HC) rectal foam, Place 1 applicator rectally 2 (two) times daily. (Patient not taking: Reported on 01/14/2018), Disp: 10 g, Rfl: 1 .  meloxicam (MOBIC) 15 MG tablet, Take 1 tablet by mouth daily., Disp: , Rfl:  .  omeprazole (PRILOSEC) 20 MG capsule, Take 1 capsule (20 mg total) by mouth daily. (Patient not taking: Reported on 06/24/2017), Disp: 90  capsule, Rfl: 3 .  raloxifene (EVISTA) 60 MG tablet, Take by mouth., Disp: , Rfl:   Review of Systems  Constitutional: Negative.   HENT: Negative.   Eyes: Negative.   Respiratory: Negative.   Cardiovascular: Negative.   Gastrointestinal: Positive for abdominal distention, abdominal pain, constipation and diarrhea.  Endocrine: Negative.   Genitourinary: Negative.   Musculoskeletal: Negative.   Skin: Negative.   Allergic/Immunologic: Negative.   Neurological: Negative.   Hematological: Negative.   Psychiatric/Behavioral: Positive for dysphoric mood. The patient is nervous/anxious.     Social History   Tobacco Use  . Smoking status: Never Smoker  . Smokeless tobacco: Never Used  Substance Use Topics  . Alcohol use: No    Alcohol/week: 0.0 oz   Objective:   BP (!) 144/72 (BP Location: Left Arm, Patient Position: Sitting, Cuff Size: Normal)   Pulse 62   Temp 98.6 F (37 C) (Oral)   Resp 16   Wt 132 lb (59.9 kg)   LMP  (LMP Unknown)   BMI 23.38 kg/m  Vitals:   01/14/18 1538  BP: (!) 144/72  Pulse: 62  Resp: 16  Temp: 98.6 F (37 C)  TempSrc: Oral  Weight: 132 lb (59.9 kg)     Physical Exam  Constitutional: She is oriented to person, place, and time. She appears well-developed and well-nourished.  HENT:  Head: Normocephalic and atraumatic.  Right Ear: External ear normal.  Left Ear: External ear normal.  Nose: Nose normal.  Eyes: Conjunctivae are normal. No scleral icterus.  Neck: No thyromegaly present.  Cardiovascular: Normal rate, regular rhythm and normal heart sounds.  Pulmonary/Chest: Effort normal and breath sounds normal.  Abdominal: Soft.  Musculoskeletal: She exhibits no edema.  Neurological: She is alert and oriented to person, place, and time. Coordination normal.  Skin: Skin is warm and dry.  Psychiatric: She has a normal mood and affect. Her behavior is normal. Thought content normal.        Assessment & Plan:     1. Benign essential  HTN   2. Other insomnia  More than 30 minute visit--50% spent in counseling. 3. Major depressive disorder, remission status unspecified, unspecified whether recurrent  - mirtazapine (REMERON) 30 MG tablet; Take 1 tablet (30 mg total) by mouth at bedtime.  Dispense: 30 tablet; Refill: 11  4. Other chronic pain Back vs shoulder. - Ambulatory referral to Orthopedic Surgery  5. Chronic pain of both shoulders  - gabapentin (NEURONTIN) 100 MG capsule; Take 1 capsule (100 mg total) by mouth 2 (two) times daily.  Dispense: 60 capsule; Refill: 5 - Ambulatory referral to Orthopedic Surgery  6. Risk for falls  - Ambulatory referral to Physical Therapy      I have done the exam and reviewed the chart and it is accurate to the  best of my knowledge. Development worker, community has been used and  any errors in dictation or transcription are unintentional. Miguel Aschoff M.D. [place], MD  [facility]

## 2018-01-22 ENCOUNTER — Telehealth: Payer: Self-pay

## 2018-01-22 ENCOUNTER — Other Ambulatory Visit: Payer: Self-pay | Admitting: Family Medicine

## 2018-01-22 NOTE — Telephone Encounter (Signed)
LMTCB and schedule AWV. Next OV is scheduled on 03/17/18 (37m f/u). -MM

## 2018-01-28 ENCOUNTER — Telehealth: Payer: Self-pay

## 2018-01-28 ENCOUNTER — Telehealth: Payer: Self-pay | Admitting: Family Medicine

## 2018-01-28 NOTE — Telephone Encounter (Signed)
Pt's daughter [name] states that the gabapentin is not helping with the pain.Pt has been taking 2 tablets of tramadol as needed

## 2018-01-28 NOTE — Telephone Encounter (Signed)
Patient's daughter called wanting to know the status of the physical therapy referral. Please advise. Thanks!

## 2018-01-29 DIAGNOSIS — M25571 Pain in right ankle and joints of right foot: Secondary | ICD-10-CM | POA: Diagnosis not present

## 2018-01-29 DIAGNOSIS — M25519 Pain in unspecified shoulder: Secondary | ICD-10-CM | POA: Diagnosis not present

## 2018-01-29 DIAGNOSIS — M4726 Other spondylosis with radiculopathy, lumbar region: Secondary | ICD-10-CM | POA: Diagnosis not present

## 2018-01-29 NOTE — Telephone Encounter (Signed)
Increase to 200mg  for  1 week and then new Rx for 300mg  q hs

## 2018-01-29 NOTE — Telephone Encounter (Signed)
Please advise 

## 2018-01-29 NOTE — Telephone Encounter (Signed)
LMTCB ED 

## 2018-02-04 ENCOUNTER — Ambulatory Visit (INDEPENDENT_AMBULATORY_CARE_PROVIDER_SITE_OTHER): Payer: Medicare Other | Admitting: Physician Assistant

## 2018-02-04 ENCOUNTER — Encounter: Payer: Self-pay | Admitting: Physician Assistant

## 2018-02-04 ENCOUNTER — Telehealth: Payer: Self-pay | Admitting: Emergency Medicine

## 2018-02-04 VITALS — BP 126/82 | HR 64 | Temp 97.9°F | Resp 16 | Wt 131.0 lb

## 2018-02-04 DIAGNOSIS — R079 Chest pain, unspecified: Secondary | ICD-10-CM

## 2018-02-04 NOTE — Telephone Encounter (Signed)
Patient here with her husband and daughter and arrangements are made for her to be seen today.

## 2018-02-04 NOTE — Telephone Encounter (Signed)
Pt came in with her husband today for his wellness. Daughter stated that pt has pain in her jaw, down her neck and into her chest yesterday. It last for a few seconds and went away. She has not had any since. Pt daughter wanted pt to be seen by someone day. Pt is also under a lot of stress with husband with dementia.

## 2018-02-04 NOTE — Progress Notes (Signed)
Patient: Lynn Wall Female    DOB: 1926-09-23   84 y.o.   MRN: [id number] Visit Date: 02/05/2018  Today's Provider: Trinna Post, PA-C   Chief Complaint  Patient presents with  . Chest Pain   Subjective:    Lynn Wall is an 84 y/o woman with history of HTN, controlled DM II, presents today with one episode of chest pain. Reports yesterday she had a sudden episode of right sided chest pain that went to her right ear. She has back pain at baseline, hx of multiple surgeries. Pain lasted briefly and self resolved. She thought nothing of it and continued cooking. Daughter brings her in for evaluation today. She reports chest pain has resolved. Feels at her baseline currently. She does not have lower extremity swelling or pain.   Chest Pain   This is a new problem. The current episode started yesterday. The onset quality is sudden. The quality of the pain is described as dull. The pain radiates to the right jaw (Right ear.). Associated symptoms include back pain. Pertinent negatives include no cough, dizziness, headaches, nausea, palpitations, shortness of breath or vomiting.       No Known Allergies   Current Outpatient Medications:  .  aspirin EC 81 MG tablet, Take 81 mg by mouth daily., Disp: , Rfl:  .  docusate sodium (STOOL SOFTENER) 100 MG capsule, Take 1 capsule (100 mg total) by mouth daily., Disp: 10 capsule, Rfl: 0 .  gabapentin (NEURONTIN) 100 MG capsule, Take 1 capsule (100 mg total) by mouth 2 (two) times daily., Disp: 60 capsule, Rfl: 5 .  linaclotide (LINZESS) 72 MCG capsule, Take 1 capsule (72 mcg total) by mouth daily before breakfast. (Patient taking differently: Take 290 mcg by mouth daily before breakfast. ), Disp: 60 capsule, Rfl: 6 .  lisinopril (PRINIVIL,ZESTRIL) 10 MG tablet, Take 1 tablet (10 mg total) by mouth daily., Disp: 90 tablet, Rfl: 3 .  meloxicam (MOBIC) 15 MG tablet, Take 1 tablet by mouth daily., Disp: , Rfl:  .  metoprolol  tartrate (LOPRESSOR) 25 MG tablet, TAKE 1 TABLET TWICE A DAY, Disp: 180 tablet, Rfl: 3 .  omeprazole (PRILOSEC) 20 MG capsule, Take 1 capsule (20 mg total) by mouth daily., Disp: 90 capsule, Rfl: 3 .  ondansetron (ZOFRAN) 4 MG tablet, 1/2 to 1 tablet every 6 hours as needed nausea, Disp: 100 tablet, Rfl: 2 .  polyethylene glycol (MIRALAX / GLYCOLAX) packet, Take 17 g by mouth daily., Disp: 14 each, Rfl: 0 .  raloxifene (EVISTA) 60 MG tablet, Take by mouth., Disp: , Rfl:  .  simvastatin (ZOCOR) 40 MG tablet, TAKE 1 TABLET BY MOUTH AT BEDTIME, Disp: 90 tablet, Rfl: 3 .  traMADol (ULTRAM) 50 MG tablet, Take 1 tablet (50 mg total) by mouth every 6 (six) hours as needed., Disp: 100 tablet, Rfl: 3 .  traZODone (DESYREL) 50 MG tablet, TAKE 1 TABLET BY MOUTH EVERYDAY AT BEDTIME, Disp: 30 tablet, Rfl: 11 .  hydrocortisone (ANUSOL-HC) 2.5 % rectal cream, Place 1 application rectally 2 (two) times daily. (Patient not taking: Reported on 01/14/2018), Disp: 30 g, Rfl: 0 .  hydrocortisone (ANUSOL-HC) 25 MG suppository, PLACE 1 SUPPOSITORY (25 MG TOTAL) RECTALLY AT BEDTIME., Disp: , Rfl: 5 .  hydrocortisone-pramoxine (PROCTOFOAM-HC) rectal foam, Place 1 applicator rectally 2 (two) times daily. (Patient not taking: Reported on 01/14/2018), Disp: 10 g, Rfl: 1 .  mirtazapine (REMERON) 30 MG tablet, Take 1 tablet (30 mg total) by  mouth at bedtime. (Patient not taking: Reported on 02/04/2018), Disp: 30 tablet, Rfl: 11  Review of Systems  Constitutional: Negative.   Respiratory: Negative for apnea, cough, choking, chest tightness, shortness of breath, wheezing and stridor.   Cardiovascular: Positive for chest pain. Negative for palpitations and leg swelling.  Gastrointestinal: Negative.  Negative for nausea and vomiting.  Musculoskeletal: Positive for back pain. Negative for arthralgias, gait problem, joint swelling, myalgias, neck pain and neck stiffness.  Neurological: Negative for dizziness, syncope, light-headedness  and headaches.    Social History   Tobacco Use  . Smoking status: Never Smoker  . Smokeless tobacco: Never Used  Substance Use Topics  . Alcohol use: No    Alcohol/week: 0.0 oz   Objective:   BP 126/82 (BP Location: Right Arm, Patient Position: Sitting, Cuff Size: Normal)   Pulse 64   Temp 97.9 F (36.6 C) (Oral)   Resp 16   Wt 131 lb (59.4 kg)   LMP  (LMP Unknown)   BMI 23.21 kg/m  Vitals:   02/04/18 1458  BP: 126/82  Pulse: 64  Resp: 16  Temp: 97.9 F (36.6 C)  TempSrc: Oral  Weight: 131 lb (59.4 kg)     Physical Exam  Constitutional: She is oriented to person, place, and time. She appears well-developed and well-nourished. No distress.  Cardiovascular: Normal rate and regular rhythm. Exam reveals no gallop, no S3 and no S4.  No murmur heard. Pulmonary/Chest: She has no decreased breath sounds.  Musculoskeletal:       Right lower leg: Normal. She exhibits no edema.       Left lower leg: Normal. She exhibits no edema.  Neurological: She is alert and oriented to person, place, and time.  Psychiatric: She has a normal mood and affect. Her behavior is normal.        Assessment & Plan:     1. Chest pain, unspecified type  EKG is comparable to 2016 EKG. No ST elevations, T-wave inversions comparable. NSR. Offered patient troponins, she declines. I think it's low likelihood that she had a cardiac event and reasonable to decline further workup. Exam today is benign.  - EKG 12-Lead  I have spent 25 minutes with this patient, >50% of which was spent on counseling and coordination of care.  Return if symptoms worsen or fail to improve.  The entirety of the information documented in the History of Present Illness, Review of Systems and Physical Exam were personally obtained by me. Portions of this information were initially documented by Ashley Royalty, CMA and reviewed by me for thoroughness and accuracy.         Trinna Post, PA-C  [facility]

## 2018-02-05 NOTE — Patient Instructions (Signed)
Chest Wall Pain °Chest wall pain is pain in or around the bones and muscles of your chest. Sometimes, an injury causes this pain. Sometimes, the cause may not be known. This pain may take several weeks or longer to get better. °Follow these instructions at home: °Pay attention to any changes in your symptoms. Take these actions to help with your pain: °· Rest as told by your doctor. °· Avoid activities that cause pain. Try not to use your chest, belly (abdominal), or side muscles to lift heavy things. °· If directed, apply ice to the painful area: °? Put ice in a plastic bag. °? Place a towel between your skin and the bag. °? Leave the ice on for 20 minutes, 2-3 times per day. °· Take over-the-counter and prescription medicines only as told by your doctor. °· Do not use tobacco products, including cigarettes, chewing tobacco, and e-cigarettes. If you need help quitting, ask your doctor. °· Keep all follow-up visits as told by your doctor. This is important. ° °Contact a doctor if: °· You have a fever. °· Your chest pain gets worse. °· You have new symptoms. °Get help right away if: °· You feel sick to your stomach (nauseous) or you throw up (vomit). °· You feel sweaty or light-headed. °· You have a cough with phlegm (sputum) or you cough up blood. °· You are short of breath. °This information is not intended to replace advice given to you by your health care provider. Make sure you discuss any questions you have with your health care provider. °Document Released: 01/30/2008 Document Revised: 01/19/2016 Document Reviewed: 11/08/2014 °Elsevier Interactive Patient Education © 2018 [employer] ° °

## 2018-02-06 DIAGNOSIS — M48061 Spinal stenosis, lumbar region without neurogenic claudication: Secondary | ICD-10-CM | POA: Diagnosis not present

## 2018-02-08 ENCOUNTER — Other Ambulatory Visit: Payer: Self-pay | Admitting: Gastroenterology

## 2018-02-10 ENCOUNTER — Ambulatory Visit (INDEPENDENT_AMBULATORY_CARE_PROVIDER_SITE_OTHER): Payer: Medicare Other

## 2018-02-10 VITALS — BP 132/66 | HR 64 | Temp 97.9°F | Ht 63.0 in | Wt 131.6 lb

## 2018-02-10 DIAGNOSIS — Z23 Encounter for immunization: Secondary | ICD-10-CM

## 2018-02-10 DIAGNOSIS — Z0000 Encounter for general adult medical examination without abnormal findings: Secondary | ICD-10-CM

## 2018-02-10 NOTE — Progress Notes (Signed)
Subjective:   Lynn Wall is a 84 y.o. female who presents for Medicare Annual (Subsequent) preventive examination.  Review of Systems:  N/A  Cardiac Risk Factors include: advanced age (>43men, >79 women);diabetes mellitus;dyslipidemia;hypertension     Objective:     Vitals: BP 132/66 (BP Location: Right Arm)   Pulse 64   Temp 97.9 F (36.6 C) (Oral)   Ht 5\' 3"  (1.6 m)   Wt 131 lb 9.6 oz (59.7 kg)   LMP  (LMP Unknown)   BMI 23.31 kg/m   Body mass index is 23.31 kg/m.  Advanced Directives 02/10/2018 01/10/2017 03/07/2015  Does Patient Have a Medical Advance Directive? Yes No;Yes Yes  Type of Paramedic of [employer];Living will [place];Living will [place] in Chart? Yes No - copy requested Yes    Tobacco Social History   Tobacco Use  Smoking Status Never Smoker  Smokeless Tobacco Never Used     Counseling given: Not Answered   Clinical Intake:  Pre-visit preparation completed: Yes  Pain : No/denies pain Pain Score: 0-No pain     Nutritional Status: BMI of 19-24  Normal Nutritional Risks: None Diabetes: Yes(type 2) CBG done?: No Did pt. bring in CBG monitor from home?: No  How often do you need to have someone help you when you read instructions, pamphlets, or other written materials from your doctor or pharmacy?: 1 - Never  Interpreter Needed?: No  Information entered by :: [facility], LPN  Past Medical History:  Diagnosis Date  . Acid reflux   . Allergy   . Cystitis   . DDD (degenerative disc disease), lumbar   . Diabetes mellitus without complication ([place])   . GERD (gastroesophageal reflux disease)   . History of kidney stones   . History of renal stone   . Hyperlipidemia   . Hypertension   . Osteoporosis   . Urinary incontinence   . UTI (urinary tract infection)    currently on antibiotic   Past Surgical History:  Procedure  Laterality Date  . ABDOMINAL HYSTERECTOMY    . CATARACT EXTRACTION, BILATERAL    . HEMORRHOID SURGERY    . LUMBAR LAMINECTOMY/DECOMPRESSION MICRODISCECTOMY Left 03/14/2015   Procedure: LUMBAR LAMINECTOMY/DECOMPRESSION MICRODISCECTOMY 1 LEVEL;  Surgeon: Newman Pies, MD;  Location: [facility];  Service: Neurosurgery;  Laterality: Left;  Left L34 microdiskectomy   Family History  Problem Relation Age of Onset  . Cancer Father   . Kidney disease Neg Hx   . Prostate cancer Neg Hx   . Bladder Cancer Neg Hx    Social History   Socioeconomic History  . Marital status: Married    Spouse name: Not on file  . Number of children: 3  . Years of education: Not on file  . Highest education level: 8th grade  Occupational History  . Occupation: retired  Scientific laboratory technician  . Financial resource strain: Not hard at all  . Food insecurity:    Worry: Never true    Inability: Never true  . Transportation needs:    Medical: No    Non-medical: No  Tobacco Use  . Smoking status: Never Smoker  . Smokeless tobacco: Never Used  Substance and Sexual Activity  . Alcohol use: No    Alcohol/week: 0.0 oz  . Drug use: No  . Sexual activity: Not Currently  Lifestyle  . Physical activity:    Days per week: Not on file  Minutes per session: Not on file  . Stress: To some extent  Relationships  . Social connections:    Talks on phone: Not on file    Gets together: Not on file    Attends religious service: Not on file    Active member of club or organization: Not on file    Attends meetings of clubs or organizations: Not on file    Relationship status: Not on file  Other Topics Concern  . Not on file  Social History Narrative  . Not on file    Outpatient Encounter Medications as of 02/10/2018  Medication Sig  . aspirin EC 81 MG tablet Take 81 mg by mouth daily.  Marland Kitchen docusate sodium (STOOL SOFTENER) 100 MG capsule Take 1 capsule (100 mg total) by mouth daily.  Marland Kitchen gabapentin (NEURONTIN) 100 MG  capsule Take 1 capsule (100 mg total) by mouth 2 (two) times daily.  Marland Kitchen lisinopril (PRINIVIL,ZESTRIL) 10 MG tablet Take 1 tablet (10 mg total) by mouth daily.  . metoprolol tartrate (LOPRESSOR) 25 MG tablet TAKE 1 TABLET TWICE A DAY  . omeprazole (PRILOSEC) 20 MG capsule Take 1 capsule (20 mg total) by mouth daily.  . ondansetron (ZOFRAN) 4 MG tablet 1/2 to 1 tablet every 6 hours as needed nausea  . polyethylene glycol (MIRALAX / GLYCOLAX) packet Take 17 g by mouth daily.  . predniSONE (DELTASONE) 10 MG tablet prednisone 10 mg tablets in a dose pack  take as directed  . simvastatin (ZOCOR) 40 MG tablet TAKE 1 TABLET BY MOUTH AT BEDTIME  . traMADol (ULTRAM) 50 MG tablet Take 1 tablet (50 mg total) by mouth every 6 (six) hours as needed.  . traZODone (DESYREL) 50 MG tablet TAKE 1 TABLET BY MOUTH EVERYDAY AT BEDTIME (Patient taking differently: TAKE 1-2 TABLET BY MOUTH EVERYDAY AT BEDTIME)  . hydrocortisone (ANUSOL-HC) 2.5 % rectal cream Place 1 application rectally 2 (two) times daily. (Patient not taking: Reported on 01/14/2018)  . hydrocortisone (ANUSOL-HC) 25 MG suppository PLACE 1 SUPPOSITORY (25 MG TOTAL) RECTALLY AT BEDTIME.  . hydrocortisone-pramoxine (PROCTOFOAM-HC) rectal foam Place 1 applicator rectally 2 (two) times daily. (Patient not taking: Reported on 01/14/2018)  . linaclotide (LINZESS) 72 MCG capsule Take 1 capsule (72 mcg total) by mouth daily before breakfast. (Patient not taking: Reported on 02/10/2018)  . meloxicam (MOBIC) 15 MG tablet Take 1 tablet by mouth daily.  . mirtazapine (REMERON) 30 MG tablet Take 1 tablet (30 mg total) by mouth at bedtime. (Patient not taking: Reported on 02/10/2018)  . raloxifene (EVISTA) 60 MG tablet Take by mouth.   No facility-administered encounter medications on file as of 02/10/2018.     Activities of Daily Living In your present state of health, do you have any difficulty performing the following activities: 02/10/2018 01/14/2018  Hearing? N N    Vision? N N  Difficulty concentrating or making decisions? N N  Walking or climbing stairs? Y Y  Comment Due to fear of falling. -  Dressing or bathing? N N  Doing errands, shopping? N Y  Conservation officer, nature and eating ? N -  Using the Toilet? N -  In the past six months, have you accidently leaked urine? Y -  Comment Occasionally at night, wears protection.  -  Do you have problems with loss of bowel control? N -  Managing your Medications? N -  Managing your Finances? Y -  Comment Daughter helps with this.  -  Housekeeping or managing your Housekeeping? N -  Some recent data might be hidden    Patient Care Team: Jerrol Banana., MD as PCP - General (Family Medicine) Jonathon Bellows, MD as Consulting Physician (Surgery) Dimmig, Marcello Moores, MD as Referring Physician (Orthopedic Surgery)    Assessment:   This is a routine wellness examination for Lynn Wall.  Exercise Activities and Dietary recommendations Current Exercise Habits: The patient does not participate in regular exercise at present(PT exercises occasionally ), Exercise limited by: orthopedic condition(s)  Goals    . DIET - INCREASE WATER INTAKE     Recommend increasing water intake to 6 glasses a day.       Fall Risk Fall Risk  02/10/2018 01/14/2018 01/10/2017 11/07/2015  Falls in the past year? Yes No Yes No  Number falls in past yr: 1 - 1 -  Injury with Fall? No - No -  Follow up Falls prevention discussed - Falls prevention discussed -   Is the patient's home free of loose throw rugs in walkways, pet beds, electrical cords, etc?   yes      Grab bars in the bathroom? yes      Handrails on the stairs?   yes      Adequate lighting?   yes  Timed Get Up and Go performed: N/A  Depression Screen PHQ 2/9 Scores 02/10/2018 01/15/2018 01/14/2018 01/10/2017  PHQ - 2 Score 1 2 1  0  PHQ- 9 Score - 13 - 7     Cognitive Function: Pt declined screening today.      6CIT Screen 01/10/2017  What Year? 0 points  What month? 0  points  What time? 0 points  Count back from 20 0 points  Months in reverse 0 points  Repeat phrase 0 points  Total Score 0    Immunization History  Administered Date(s) Administered  . Influenza, High Dose Seasonal PF 05/23/2015, 08/06/2016, 07/23/2017  . Pneumococcal Conjugate-13 01/10/2017  . Tdap 03/27/2012    Qualifies for Shingles Vaccine? Due for Shingles vaccine. Declined my offer to administer today. Education has been provided regarding the importance of this vaccine. Pt has been advised to call her insurance company to determine her out of pocket expense. Advised she may also receive this vaccine at her local pharmacy or Health Dept. Verbalized acceptance and understanding.  Screening Tests Health Maintenance  Topic Date Due  . OPHTHALMOLOGY EXAM  08/20/1937  . HEMOGLOBIN A1C  07/13/2017  . FOOT EXAM  01/10/2018  . INFLUENZA VACCINE  03/27/2018  . TETANUS/TDAP  03/27/2022  . DEXA SCAN  Completed  . PNA vac Low Risk Adult  Completed    Cancer Screenings: Lung: Low Dose CT Chest recommended if Age 73-80 years, 30 pack-year currently smoking OR have quit w/in 15years. Patient does not qualify. Breast:  Up to date on Mammogram? Yes   Up to date of Bone Density/Dexa? Yes Colorectal: Up to date   Additional Screenings:  Hepatitis C Screening: N/A     Plan:  I have personally reviewed and addressed the Medicare Annual Wellness questionnaire and have noted the following in the patient's chart:  A. Medical and social history B. Use of alcohol, tobacco or illicit drugs  C. Current medications and supplements D. Functional ability and status E.  Nutritional status F.  Physical activity G. Advance directives H. List of other physicians I.  Hospitalizations, surgeries, and ER visits in previous 12 months J.  [place] such as hearing and vision if needed, cognitive and depression L. Referrals and appointments -  none  In addition, I have reviewed and  discussed with patient certain preventive protocols, quality metrics, and best practice recommendations. A written personalized care plan for preventive services as well as general preventive health recommendations were provided to patient.  See attached scanned questionnaire for additional information.   Signed,  Fabio Neighbors, LPN Nurse Health Advisor   Nurse Recommendations: Pt needs a diabetic foot exam and Hgb A1c checked at next OV. Pt states she had an eye exam at the beginning of the year. Will contact [facility] to obtain OV notes.

## 2018-02-10 NOTE — Patient Instructions (Addendum)
Lynn Wall , Thank you for taking time to come for your Medicare Wellness Visit. I appreciate your ongoing commitment to your health goals. Please review the following plan we discussed and let me know if I can assist you in the future.   Screening recommendations/referrals: Colonoscopy: Up to date Mammogram: Up to date Bone Density: Up to date Recommended yearly ophthalmology/optometry visit for glaucoma screening and checkup Recommended yearly dental visit for hygiene and checkup  Vaccinations: Influenza vaccine: Up to date Pneumococcal vaccine: Up to date Tdap vaccine: Up to date Shingles vaccine: Pt declines today.     Advanced directives: Already on file.   Conditions/risks identified: Recommend increasing water intake to 6 glasses a day.  Next appointment: 03/17/18 with Dr Rosanna Randy.    Preventive Care 82 Years and Older, Female Preventive care refers to lifestyle choices and visits with your health care provider that can promote health and wellness. What does preventive care include?  A yearly physical exam. This is also called an annual well check.  Dental exams once or twice a year.  Routine eye exams. Ask your health care provider how often you should have your eyes checked.  Personal lifestyle choices, including:  Daily care of your teeth and gums.  Regular physical activity.  Eating a healthy diet.  Avoiding tobacco and drug use.  Limiting alcohol use.  Practicing safe sex.  Taking low-dose aspirin every day.  Taking vitamin and mineral supplements as recommended by your health care provider. What happens during an annual well check? The services and screenings done by your health care provider during your annual well check will depend on your age, overall health, lifestyle risk factors, and family history of disease. Counseling  Your health care provider may ask you questions about your:  Alcohol use.  Tobacco use.  Drug use.  Emotional  well-being.  Home and relationship well-being.  Sexual activity.  Eating habits.  History of falls.  Memory and ability to understand (cognition).  Work and work Statistician.  Reproductive health. Screening  You may have the following tests or measurements:  Height, weight, and BMI.  Blood pressure.  Lipid and cholesterol levels. These may be checked every 5 years, or more frequently if you are over 20 years old.  Skin check.  Lung cancer screening. You may have this screening every year starting at age 35 if you have a 30-pack-year history of smoking and currently smoke or have quit within the past 15 years.  Fecal occult blood test (FOBT) of the stool. You may have this test every year starting at age 75.  Flexible sigmoidoscopy or colonoscopy. You may have a sigmoidoscopy every 5 years or a colonoscopy every 10 years starting at age 31.  Hepatitis C blood test.  Hepatitis B blood test.  Sexually transmitted disease (STD) testing.  Diabetes screening. This is done by checking your blood sugar (glucose) after you have not eaten for a while (fasting). You may have this done every 1-3 years.  Bone density scan. This is done to screen for osteoporosis. You may have this done starting at age 15.  Mammogram. This may be done every 1-2 years. Talk to your health care provider about how often you should have regular mammograms. Talk with your health care provider about your test results, treatment options, and if necessary, the need for more tests. Vaccines  Your health care provider may recommend certain vaccines, such as:  Influenza vaccine. This is recommended every year.  Tetanus, diphtheria, and acellular  pertussis (Tdap, Td) vaccine. You may need a Td booster every 10 years.  Zoster vaccine. You may need this after age 18.  Pneumococcal 13-valent conjugate (PCV13) vaccine. One dose is recommended after age 31.  Pneumococcal polysaccharide (PPSV23) vaccine. One  dose is recommended after age 58. Talk to your health care provider about which screenings and vaccines you need and how often you need them. This information is not intended to replace advice given to you by your health care provider. Make sure you discuss any questions you have with your health care provider. Document Released: 09/09/2015 Document Revised: 05/02/2016 Document Reviewed: 06/14/2015 Elsevier Interactive Patient Education  2017 [place] Prevention in the Home Falls can cause injuries. They can happen to people of all ages. There are many things you can do to make your home safe and to help prevent falls. What can I do on the outside of my home?  Regularly fix the edges of walkways and driveways and fix any cracks.  Remove anything that might make you trip as you walk through a door, such as a raised step or threshold.  Trim any bushes or trees on the path to your home.  Use bright outdoor lighting.  Clear any walking paths of anything that might make someone trip, such as rocks or tools.  Regularly check to see if handrails are loose or broken. Make sure that both sides of any steps have handrails.  Any raised decks and porches should have guardrails on the edges.  Have any leaves, snow, or ice cleared regularly.  Use sand or salt on walking paths during winter.  Clean up any spills in your garage right away. This includes oil or grease spills. What can I do in the bathroom?  Use night lights.  Install grab bars by the toilet and in the tub and shower. Do not use towel bars as grab bars.  Use non-skid mats or decals in the tub or shower.  If you need to sit down in the shower, use a plastic, non-slip stool.  Keep the floor dry. Clean up any water that spills on the floor as soon as it happens.  Remove soap buildup in the tub or shower regularly.  Attach bath mats securely with double-sided non-slip rug tape.  Do not have throw rugs and other  things on the floor that can make you trip. What can I do in the bedroom?  Use night lights.  Make sure that you have a light by your bed that is easy to reach.  Do not use any sheets or blankets that are too big for your bed. They should not hang down onto the floor.  Have a firm chair that has side arms. You can use this for support while you get dressed.  Do not have throw rugs and other things on the floor that can make you trip. What can I do in the kitchen?  Clean up any spills right away.  Avoid walking on wet floors.  Keep items that you use a lot in easy-to-reach places.  If you need to reach something above you, use a strong step stool that has a grab bar.  Keep electrical cords out of the way.  Do not use floor polish or wax that makes floors slippery. If you must use wax, use non-skid floor wax.  Do not have throw rugs and other things on the floor that can make you trip. What can I do with my stairs?  Do not leave any items on the stairs.  Make sure that there are handrails on both sides of the stairs and use them. Fix handrails that are broken or loose. Make sure that handrails are as long as the stairways.  Check any carpeting to make sure that it is firmly attached to the stairs. Fix any carpet that is loose or worn.  Avoid having throw rugs at the top or bottom of the stairs. If you do have throw rugs, attach them to the floor with carpet tape.  Make sure that you have a light switch at the top of the stairs and the bottom of the stairs. If you do not have them, ask someone to add them for you. What else can I do to help prevent falls?  Wear shoes that:  Do not have high heels.  Have rubber bottoms.  Are comfortable and fit you well.  Are closed at the toe. Do not wear sandals.  If you use a stepladder:  Make sure that it is fully opened. Do not climb a closed stepladder.  Make sure that both sides of the stepladder are locked into place.  Ask  someone to hold it for you, if possible.  Clearly mark and make sure that you can see:  Any grab bars or handrails.  First and last steps.  Where the edge of each step is.  Use tools that help you move around (mobility aids) if they are needed. These include:  Canes.  Walkers.  Scooters.  Crutches.  Turn on the lights when you go into a dark area. Replace any light bulbs as soon as they burn out.  Set up your furniture so you have a clear path. Avoid moving your furniture around.  If any of your floors are uneven, fix them.  If there are any pets around you, be aware of where they are.  Review your medicines with your doctor. Some medicines can make you feel dizzy. This can increase your chance of falling. Ask your doctor what other things that you can do to help prevent falls. This information is not intended to replace advice given to you by your health care provider. Make sure you discuss any questions you have with your health care provider. Document Released: 06/09/2009 Document Revised: 01/19/2016 Document Reviewed: 09/17/2014 Elsevier Interactive Patient Education  2017 [employer].

## 2018-02-17 DIAGNOSIS — M48061 Spinal stenosis, lumbar region without neurogenic claudication: Secondary | ICD-10-CM | POA: Diagnosis not present

## 2018-02-19 ENCOUNTER — Other Ambulatory Visit: Payer: Self-pay | Admitting: Family Medicine

## 2018-02-19 NOTE — Telephone Encounter (Signed)
[facility] for the following medication. Thanks CC ° °traMADol (ULTRAM) 50 MG tablet  ° °

## 2018-02-20 MED ORDER — TRAMADOL HCL 50 MG PO TABS: 50.0000 mg | ORAL_TABLET | Freq: Four times a day (QID) | ORAL | 3 refills | Status: DC | PRN

## 2018-02-25 ENCOUNTER — Other Ambulatory Visit: Payer: Self-pay | Admitting: Family Medicine

## 2018-03-08 ENCOUNTER — Other Ambulatory Visit: Payer: Self-pay | Admitting: Family Medicine

## 2018-03-08 DIAGNOSIS — M25512 Pain in left shoulder: Principal | ICD-10-CM

## 2018-03-08 DIAGNOSIS — G8929 Other chronic pain: Secondary | ICD-10-CM

## 2018-03-08 DIAGNOSIS — M25511 Pain in right shoulder: Principal | ICD-10-CM

## 2018-03-17 ENCOUNTER — Ambulatory Visit (INDEPENDENT_AMBULATORY_CARE_PROVIDER_SITE_OTHER): Payer: Medicare Other | Admitting: Family Medicine

## 2018-03-17 ENCOUNTER — Encounter: Payer: Self-pay | Admitting: Family Medicine

## 2018-03-17 VITALS — BP 138/82 | HR 64 | Temp 98.1°F | Resp 16 | Wt 131.0 lb

## 2018-03-17 DIAGNOSIS — K5909 Other constipation: Secondary | ICD-10-CM | POA: Diagnosis not present

## 2018-03-17 DIAGNOSIS — M5136 Other intervertebral disc degeneration, lumbar region: Secondary | ICD-10-CM | POA: Diagnosis not present

## 2018-03-17 DIAGNOSIS — M5416 Radiculopathy, lumbar region: Secondary | ICD-10-CM | POA: Diagnosis not present

## 2018-03-17 DIAGNOSIS — E119 Type 2 diabetes mellitus without complications: Secondary | ICD-10-CM | POA: Diagnosis not present

## 2018-03-17 MED ORDER — CILIDINIUM-CHLORDIAZEPOXIDE 2.5-5 MG PO CAPS: 1.0000 | ORAL_CAPSULE | Freq: Two times a day (BID) | ORAL | 1 refills | Status: DC | PRN

## 2018-03-17 NOTE — Progress Notes (Signed)
Patient: Lynn Wall Female    DOB: 09/05/1926   84 y.o.   MRN: [id number] Visit Date: 03/17/2018  Today's Provider: [name], MD   Chief Complaint  Patient presents with  . Depression  . Hypertension  . Hyperlipidemia   Subjective:    Depression         This is a chronic (Pt was prescribed mirtazprine at her last OV.  She reports taking it for one night and had to stop secondary to side effect.  It caused nightmare, dizziness. ) problem.  Associated symptoms include decreased concentration and sad.  Associated symptoms include no fatigue, no helplessness, no hopelessness, does not have insomnia, not irritable, no restlessness, no decreased interest, no appetite change, no body aches, no myalgias, no headaches, no indigestion and no suicidal ideas.   Pertinent negatives include no anxiety. Hypertension  This is a chronic problem. Pertinent negatives include no anxiety, blurred vision, chest pain, headaches, malaise/fatigue, neck pain, orthopnea, palpitations, peripheral edema, PND, shortness of breath or sweats.  Hyperlipidemia  This is a chronic problem. The problem is controlled. Recent lipid tests were reviewed and are normal. Associated symptoms include leg pain. Pertinent negatives include no chest pain, myalgias or shortness of breath. (Pt reports muscle cramps. ) Current antihyperlipidemic treatment includes statins. There are no compliance problems.    Lab Results  Component Value Date   CHOL 135 01/16/2017   CHOL 136 03/12/2016   Lab Results  Component Value Date   HDL 46 01/16/2017   HDL 47 03/12/2016   Lab Results  Component Value Date   LDLCALC 69 01/16/2017   LDLCALC 63 03/12/2016   Lab Results  Component Value Date   TRIG 100 01/16/2017   TRIG 132 03/12/2016   No results found for: CHOLHDL No results found for: LDLDIRECT BP Readings from Last 3 Encounters:  03/17/18 138/82  02/10/18 132/66  02/04/18 126/82   Wt Readings from Last  3 Encounters:  03/17/18 131 lb (59.4 kg)  02/10/18 131 lb 9.6 oz (59.7 kg)  02/04/18 131 lb (59.4 kg)        Allergies  Allergen Reactions  . Remeron [Mirtazapine]     Dizziness and confusion      Current Outpatient Medications:  .  aspirin EC 81 MG tablet, Take 81 mg by mouth daily., Disp: , Rfl:  .  docusate sodium (STOOL SOFTENER) 100 MG capsule, Take 1 capsule (100 mg total) by mouth daily., Disp: 10 capsule, Rfl: 0 .  enalapril (VASOTEC) 10 MG tablet, Take 10 mg by mouth daily., Disp: , Rfl:  .  gabapentin (NEURONTIN) 100 MG capsule, TAKE 1 CAPSULE BY MOUTH TWICE A DAY, Disp: 180 capsule, Rfl: 2 .  linaclotide (LINZESS) 72 MCG capsule, Take 1 capsule (72 mcg total) by mouth daily before breakfast., Disp: 60 capsule, Rfl: 6 .  meloxicam (MOBIC) 15 MG tablet, Take 1 tablet by mouth daily., Disp: , Rfl:  .  metoprolol tartrate (LOPRESSOR) 25 MG tablet, TAKE 1 TABLET TWICE A DAY, Disp: 180 tablet, Rfl: 3 .  omeprazole (PRILOSEC) 20 MG capsule, Take 1 capsule (20 mg total) by mouth daily., Disp: 90 capsule, Rfl: 3 .  ondansetron (ZOFRAN) 4 MG tablet, 1/2 to 1 tablet every 6 hours as needed nausea, Disp: 100 tablet, Rfl: 2 .  polyethylene glycol (MIRALAX / GLYCOLAX) packet, Take 17 g by mouth daily., Disp: 14 each, Rfl: 0 .  simvastatin (ZOCOR) 40 MG tablet, TAKE 1  TABLET BY MOUTH AT BEDTIME, Disp: 90 tablet, Rfl: 3 .  traMADol (ULTRAM) 50 MG tablet, Take 1 tablet (50 mg total) by mouth every 6 (six) hours as needed., Disp: 100 tablet, Rfl: 3 .  traZODone (DESYREL) 50 MG tablet, TAKE 1 TABLET BY MOUTH EVERYDAY AT BEDTIME (Patient taking differently: TAKE 1-2 TABLET BY MOUTH EVERYDAY AT BEDTIME), Disp: 30 tablet, Rfl: 11 .  hydrocortisone (ANUSOL-HC) 2.5 % rectal cream, Place 1 application rectally 2 (two) times daily. (Patient not taking: Reported on 01/14/2018), Disp: 30 g, Rfl: 0 .  hydrocortisone (ANUSOL-HC) 25 MG suppository, PLACE 1 SUPPOSITORY (25 MG TOTAL) RECTALLY AT BEDTIME.,  Disp: , Rfl: 5 .  hydrocortisone-pramoxine (PROCTOFOAM-HC) rectal foam, Place 1 applicator rectally 2 (two) times daily. (Patient not taking: Reported on 01/14/2018), Disp: 10 g, Rfl: 1 .  lisinopril (PRINIVIL,ZESTRIL) 10 MG tablet, Take 1 tablet (10 mg total) by mouth daily. (Patient not taking: Reported on 03/17/2018), Disp: 90 tablet, Rfl: 3 .  mirtazapine (REMERON) 30 MG tablet, Take 1 tablet (30 mg total) by mouth at bedtime. (Patient not taking: Reported on 02/10/2018), Disp: 30 tablet, Rfl: 11 .  predniSONE (DELTASONE) 10 MG tablet, prednisone 10 mg tablets in a dose pack  take as directed, Disp: , Rfl:  .  raloxifene (EVISTA) 60 MG tablet, Take by mouth., Disp: , Rfl:   Review of Systems  Constitutional: Negative.  Negative for appetite change, fatigue and malaise/fatigue.  HENT: Negative.   Eyes: Negative.  Negative for blurred vision.  Respiratory: Negative.  Negative for shortness of breath.   Cardiovascular: Negative.  Negative for chest pain, palpitations, orthopnea and PND.  Gastrointestinal: Negative.   Endocrine: Negative.   Musculoskeletal: Negative for myalgias and neck pain.  Allergic/Immunologic: Negative.   Neurological: Negative for dizziness, light-headedness and headaches.  Psychiatric/Behavioral: Positive for decreased concentration, depression and dysphoric mood. Negative for agitation, behavioral problems, confusion, hallucinations, self-injury, sleep disturbance and suicidal ideas. The patient is not nervous/anxious, does not have insomnia and is not hyperactive.     Social History   Tobacco Use  . Smoking status: Never Smoker  . Smokeless tobacco: Never Used  Substance Use Topics  . Alcohol use: No    Alcohol/week: 0.0 oz   Objective:   BP 138/82 (BP Location: Right Arm, Patient Position: Sitting, Cuff Size: Normal)   Pulse 64   Temp 98.1 F (36.7 C) (Oral)   Resp 16   Wt 131 lb (59.4 kg)   LMP  (LMP Unknown)   BMI 23.21 kg/m  Vitals:   03/17/18 1038   BP: 138/82  Pulse: 64  Resp: 16  Temp: 98.1 F (36.7 C)  TempSrc: Oral  Weight: 131 lb (59.4 kg)     Physical Exam  Constitutional: She is oriented to person, place, and time. She appears well-developed and well-nourished. She is not irritable.  HENT:  Head: Normocephalic and atraumatic.  Eyes: Conjunctivae are normal. No scleral icterus.  Cardiovascular: Normal rate, regular rhythm and normal heart sounds.  Pulmonary/Chest: Effort normal and breath sounds normal.  Abdominal: Soft.  Musculoskeletal: She exhibits no edema.  Neurological: She is oriented to person, place, and time.  Skin: Skin is warm and dry.  Psychiatric: She has a normal mood and affect. Her behavior is normal. Judgment and thought content normal.        Assessment & Plan:     Depression/GAD/Insomnia Aging issues,husband with dementia.Pt with chronic pain. Chronic Pain/LBP Pt sees physiatry next week. Chonic Constipation Per GI [facility]  I have done the exam and reviewed the above chart and it is accurate to the best of my knowledge. Development worker, community has been used in this note in any air is in the dictation or transcription are unintentional.  [name], MD  [place]

## 2018-03-19 ENCOUNTER — Other Ambulatory Visit: Payer: Self-pay | Admitting: Family Medicine

## 2018-03-20 DIAGNOSIS — M48061 Spinal stenosis, lumbar region without neurogenic claudication: Secondary | ICD-10-CM | POA: Diagnosis not present

## 2018-03-20 DIAGNOSIS — M4726 Other spondylosis with radiculopathy, lumbar region: Secondary | ICD-10-CM | POA: Diagnosis not present

## 2018-03-25 ENCOUNTER — Other Ambulatory Visit: Payer: Self-pay | Admitting: Family Medicine

## 2018-03-29 ENCOUNTER — Other Ambulatory Visit: Payer: Self-pay | Admitting: Family Medicine

## 2018-04-16 DIAGNOSIS — M5416 Radiculopathy, lumbar region: Secondary | ICD-10-CM | POA: Diagnosis not present

## 2018-04-21 ENCOUNTER — Ambulatory Visit: Payer: Medicare Other | Admitting: Family Medicine

## 2018-05-01 DIAGNOSIS — M48061 Spinal stenosis, lumbar region without neurogenic claudication: Secondary | ICD-10-CM | POA: Diagnosis not present

## 2018-05-28 DIAGNOSIS — M5416 Radiculopathy, lumbar region: Secondary | ICD-10-CM | POA: Diagnosis not present

## 2018-06-19 DIAGNOSIS — M5416 Radiculopathy, lumbar region: Secondary | ICD-10-CM | POA: Diagnosis not present

## 2018-06-29 ENCOUNTER — Other Ambulatory Visit: Payer: Self-pay | Admitting: Family Medicine

## 2018-06-29 DIAGNOSIS — E7800 Pure hypercholesterolemia, unspecified: Secondary | ICD-10-CM

## 2018-07-01 ENCOUNTER — Ambulatory Visit (INDEPENDENT_AMBULATORY_CARE_PROVIDER_SITE_OTHER): Payer: Medicare Other | Admitting: Family Medicine

## 2018-07-01 DIAGNOSIS — Z23 Encounter for immunization: Secondary | ICD-10-CM | POA: Diagnosis not present

## 2018-07-01 NOTE — Progress Notes (Signed)
Flu shot only

## 2018-07-02 ENCOUNTER — Other Ambulatory Visit: Payer: Self-pay | Admitting: Family Medicine

## 2018-07-07 ENCOUNTER — Ambulatory Visit: Payer: Self-pay

## 2018-07-07 ENCOUNTER — Other Ambulatory Visit: Payer: Self-pay

## 2018-07-07 DIAGNOSIS — Z636 Dependent relative needing care at home: Secondary | ICD-10-CM

## 2018-07-07 DIAGNOSIS — E119 Type 2 diabetes mellitus without complications: Secondary | ICD-10-CM

## 2018-07-07 NOTE — Chronic Care Management (AMB) (Signed)
  Chronic Care Management   Note  07/07/2018 Name: BEOLA VASALLO MRN: [id number] DOB: [birth year]   Patients husband, Najmah Carradine, has been referred to the CCM (Chronic Care Management) program by his PCP Dr. Rosanna Randy for chronic care management/care coordination needs related to his progressive dementia/Alzheimers. Upon further discussion with Mrs. Landgren and daughter, the above patient is in great need of resources for caregiving related to her husbands progressive memory loss. Per daughter, initial referral was place under Mr. Lillyanne Bradburn, however wife is in greatest need of services at this time.  CCM RN CM requested from PCP that referral be placed for CCM services for the above patient. She has already consented for services for her husband related to resources for care options. An appointment with the CCM Team has been scheduled for Monday 07/14/18. The above patient and daughter will meet with the CCMTeam face to face to understand progression of patient's disease, caregiver options, resources for dementia care, and home safety.   Plan: Initial Visit with the CCM team as indicated above. Will consent patient prior to visit.  Gracyn Allor E. Rollene Rotunda, RN, BSN Nurse Care Coordinator [facility] Care Management [phone]

## 2018-07-14 ENCOUNTER — Ambulatory Visit (INDEPENDENT_AMBULATORY_CARE_PROVIDER_SITE_OTHER): Payer: Medicare Other

## 2018-07-14 DIAGNOSIS — Z636 Dependent relative needing care at home: Secondary | ICD-10-CM

## 2018-07-14 DIAGNOSIS — G4709 Other insomnia: Secondary | ICD-10-CM

## 2018-07-14 DIAGNOSIS — E119 Type 2 diabetes mellitus without complications: Secondary | ICD-10-CM

## 2018-07-14 NOTE — Chronic Care Management (AMB) (Signed)
Chronic Care Management   Initial Visit Note  07/14/2018 Name: Lynn Wall MRN: [id number] DOB: 1927-02-14  Referred by: Jerrol Banana., MD Reason for referral : Chronic Care Management (initial face to face assessment)   Subjective: "I need some help with my husband" "His Alzheimers is causing a lot of stress for me"  Objective:  Assessment: Lynn Wall is an 84 year old female patient of Dr. Miguel Aschoff who was referred to the Chronic Care Management Team by PCP for assistance with managing her caregiver strain related to her husband's diagnosis of Alzheimers and recent change in his behaviors related to memory. Lynn Wall has a history of but not limited to caregiver strain, [facility], SVT, DM2, and DDD of lumbar. [name] is seen in the office today by the CCM team for her initial face to face visit.  Goals Addressed    . "I need help understanding my husbands dementia" (pt-stated)       Lynn Wall reminisces about her marriage and coming to the [place] with her husband. She misses [place] and wishes to return although she feels it is not possible at this stage of her life and with her husbands dementia. She states her husband has very little short term memory. She describes him asking just recently how he knows her. It is stressful for her to think about the future and his disease progressing. She is not used to making any decisions as he was always the decision maker. She frustrated that he is always eating sweets (she has to now hide them) and seems to never "get full". She does not understand why he doesn't remember what she has told him 3 min after their conversations and he goes to his woodworking shop and just "wonders around". Also feels home safety modifications will need to be made if his dementia could get worse.    Clinical Goals: Over the next 14 days, patient will verbalize a better understanding of signs/symptoms of  Alzheimer's  Interventions: Practiced active listening and gave emotional support to patient and daughter. Provided basic verbal and written information on Alzheimer's disease and how to safe proof home.    Marland Kitchen "I need to know what options I have for caregiver resources" (pt-stated)       Lynn Wall is expressing difficulty accepting how her husbands dementia his also affecting her life. She states he is up during the nights and frequently wakes her up. She has to hide food in the home so "I am able to have something sweet". She and daughter feel it will soon be unsafe to leave husband at home alone.    Clinical Goals: Over the next 14 days, patient will verbalize exploring resources given for caregiver support and care options for husband.  Interventions: Provided patient with local caregiver resources including caregiver support group, adult day programs and in-home care options.  2.Please consider joining a support group for caregivers of dementia patients:   [facility]   [address]   [phone]  3.Please explore resources for Dementia Day Care:   [facility] [place] [address] [place]   [phone]   [email or web address]    [facility])   [address] [place]   [email or web address]  4. Please explore resources for In home care:   [facility] [place]   [facility] [phone]      Plan:  The CCM team will follow up with patient in 2 weeks and be available to patient and daughter as needed.   [name] was given information about Chronic Care Management services today including:  1. CCM service includes personalized support from designated clinical staff supervised by her physician, including individualized plan of care and coordination with other care providers 2. 24/7 contact phone numbers for assistance for urgent and routine  care needs. 3. Service will only be billed when office clinical staff spend 20 minutes or more in a month to coordinate care. 4. Only one practitioner may furnish and bill the service in a calendar month. 5. The patient may stop CCM services at any time (effective at the end of the month) by phone call to the office staff. 6. The patient will be responsible for cost sharing (co-pay) of up to 20% of the service fee (after annual deductible is met).  Patient agreed to services and verbal consent obtained.  Raeghan Demeter E. Rollene Rotunda, RN, BSN Nurse Care Coordinator [facility] Care Management [phone]

## 2018-07-14 NOTE — Patient Instructions (Addendum)
1.Thank you for allowing the CCM Team to assist you with your healthcare needs/goals. 2.Please consider joining a support group for caregivers of dementia patients:   [facility]   [address]   [phone] 3.Please explore resources for Dementia Day Care:   [facility] [place] [address] [place]   [phone]   [email or web address]    [facility])   [address] [place]   [email or web address]  4. Please explore resources for In home care:   [facility] [place] [facility] [phone]    5. Try to limit [name] "sweet treats" to healthy treats (protien bars, maple syrup in oatmeal, cool whip with berries) 6. Consider keeping all medications in a locked box.  Safe proof homes as we have discussed today. 7. Encourage mind stimulating hobbies such as word search puzzles.  Lynn Wall was given information about Chronic Care Management services today including:  1. CCM service includes personalized support from designated clinical staff supervised by her physician, including individualized plan of care and coordination with other care providers 2. 24/7 contact phone numbers for assistance for urgent and routine care needs. 3. Service will only be billed when office clinical staff spend 20 minutes or more in a month to coordinate care. 4. Only one practitioner may furnish and bill the service in a calendar month. 5. The patient may stop CCM services at any time (effective at the end of the month) by phone call to the office staff. 6. The patient will be responsible for cost sharing (co-pay) of up to 20% of the service fee (after annual deductible is met).  Patient agreed to services and verbal consent obtained.   CCM (Chronic Care Management) Team   Trish Fountain RN, BSN Nurse Care Coordinator  [phone]  Ruben Reason PharmD   Clinical Pharmacist  [phone]    Alzheimer Disease Caregiver Guide A person who has Alzheimer disease may not be able to take care of himself or herself. He or she may need help with simple tasks. The tips below can help you care for the person. Memory loss and confusion If the person is confused or cannot remember things:  Stay calm.  Respond with a short answer.  Avoid correcting him or her in a way that sounds like scolding.  Try not to take it personally, even if he or she forgets your name.  Behavior changes The person may go through behavior changes. This can include depression, anxiety, anger, or seeing things that are not there. When behavior changes:  Try not to take behavior changes personally.  Stay calm and patient.  Do not argue or try to convince the person about a specific point.  Know that these changes are part of the disease process. Try to work through it.  Tips to lessen frustration  Make appointments and do daily tasks when the person is at his or her best.  Take your time. Simple tasks may take longer. Allow plenty of time to complete tasks.  Limit choices for the person.  Involve the person in what you are doing.  Stick to a routine.  Avoid new or crowded places, if possible.  Use simple words, short sentences, and a calm voice. Only give 1 direction at a time.  Buy clothes and shoes that are easy to put on and take off.  Let people help if they offer.  Home safety  Keep floors clear. Remove rugs, magazine racks, and floor lamps.  Keep hallways well lit.  Put a handrail and nonslip mat in the bathtub or shower.  Put childproof locks on cabinets that have dangerous items in them. These items include medicine, alcohol, guns, toxic cleaning items, sharp tools, matches, or lighters.  Place locks on doors where the person cannot see or reach them. This helps the person to not wander out of the house and get lost.  Be prepared for  emergencies. Keep a list of emergency phone numbers and addresses in a handy area. Plans for the future  Talk about finances. ? Talk about money management. People with Alzheimer disease have trouble managing their money as the disease gets worse. ? Get help from professional advisors about financial and legal matters.  Talk about future care. ? Choose a power of attorney. This is someone who can make decisions for the person with Alzheimer disease when he or she can no longer do so. ? Talk about driving and when it is the right time to stop. The person's doctor can help with this. ? If the person lives alone, make sure he or she is safe. Some people need extra help at home. Other people need more care at a nursing home or care center. Support groups Some benefits of joining a support group include:  Learning ways to manage stress.  Sharing experiences with others.  Getting emotional comfort and support.  Learning new caregiving skills as the disease progresses.  Knowing what community resources are available and taking advantage of them.  Get help if:  The person has a fever.  The person has a sudden behavior change that does not get better with calming strategies.  The person is unable to manage his or her living situation.  The person threatens you or anyone else, including himself or herself.  You are no longer able to care for the person. This information is not intended to replace advice given to you by your health care provider. Make sure you discuss any questions you have with your health care provider. Document Released: 11/05/2011 Document Revised: 01/19/2016 Document Reviewed: 10/03/2011 Elsevier Interactive Patient Education  2017 [employer].

## 2018-07-28 ENCOUNTER — Ambulatory Visit (INDEPENDENT_AMBULATORY_CARE_PROVIDER_SITE_OTHER): Payer: Medicare Other

## 2018-07-28 DIAGNOSIS — Z636 Dependent relative needing care at home: Secondary | ICD-10-CM

## 2018-07-28 DIAGNOSIS — E119 Type 2 diabetes mellitus without complications: Secondary | ICD-10-CM | POA: Diagnosis not present

## 2018-07-28 NOTE — Chronic Care Management (AMB) (Signed)
Chronic Care Management   Follow Up Note   07/28/2018 Name: GABRIELLA WOODHEAD MRN: [id number] DOB: 03-19-1927  Referred by: Jerrol Banana., MD Reason for referral : Chronic Care Management (caregiver strain)    Subjective: "It has been a difficult weekend because he is not sleeping at night"   Objective:   Assessment:  Ms. Lyzbeth Genrich is an 84 year old female patient of Dr. Miguel Aschoff who was referred to the Chronic Care Management Team by PCP for assistance with managing her caregiver strain related to her husband's diagnosis of Alzheimers and recent change in his behaviors related to memory. Ms. Cargile has a history of but not limited to caregiver strain, [facility], SVT, DM2, and DDD of lumbar. The CCM Team collectively followed up with Ms. Situ and her daughter [name] today via telephone.   Goals Addressed    . COMPLETED: "I need help understanding my husbands dementia" (pt-stated)   On track    Ms Fretz states she appreciates the information about dementia given to her by the CCM Team. She states she has also been given information about the potential progression of her husbands disease by her friends, family, and doctors. She verbalizes understanding that his dementia will worsen and his symptoms will need to be controled by medication adjustments.  Clinical Goals: Over the next 14 days, patient will verbalize a better understanding of signs/symptoms of Alzheimer's  Interventions: Practiced active listening and gave emotional support to patient and daughter. Provided basic verbal and written information on Alzheimer's disease.    Marland Kitchen "I need to know what options I have for caregiver resources" (pt-stated)   Not on track    Spoke with Ms. Domingos and daughter [name]. Ms. Hojnacki acknowledges that Mr.Vandergoots dementia is getting worse and his most recent symptoms include insomnia, admission of depression, crying spells, agitation, and anxiety. Ms.  Bradway states she is staying up at night with her husband and if he is able to sleep he awakens early in the morning. She is not ready to accept care into the home as "I don't think his condition is bad enough because some days he acts like nothing is wrong". She admits to being very stressed. According to her daughter [name], Ms. Karpel continues to grieve the loss of who her husband was. [name] states Ms. Delauter is reluctant to change because change means acceptance that Mr. Filippini's dementia will not improve. Ms. Bettes is not willing to attend a support group at this time. She has not discussed additional care or plan of custodial care with her daughter although [name] (daughter) admits custodial care is much needed. Daughter [name] continues to work with patient towards above goal.   Clinical Goals: 07/28/18 Goal revised: Over the next 30 days, patient will verbalize exploring resources given for caregiver support and care options for husband.  Interventions: Provided patient with local caregiver resources including caregiver support group, adult day programs and in-home care options.  2.Please consider joining a support group for caregivers of dementia patients:   [facility]   [address]   [phone] 3.Please explore resources for Dementia Day Care:   [facility] [place] [address] [place]   [phone]   [email or web address]    [facility])   [address], [place]   [email or web address]  4. Please explore resources for In home care:   [facility] [phone]  [facility] [phone]  07/28/18 Provided emotional support and reassurances. Encouraged patient to considered support group and utilize resources for custodial care when ready.     Plan: Will follow up with patient and daughter in 2 weeks.   Deysi Soldo E. Rollene Rotunda, RN, BSN Nurse Care  Coordinator [facility] Care Management [phone]

## 2018-07-29 NOTE — Chronic Care Management (AMB) (Signed)
  Chronic Care Management   Follow Up Note   07/29/2018 Name: Lynn Wall MRN: [id number] DOB: 15-Aug-1927  Referred by: Jerrol Banana., MD Reason for referral : Chronic Care Management (caregiver strain)  Subjective:  "It was difficult for me to sleep because my husband kept waking up at night"   Assessment: Reviewed Mr. Behne's medications with Ms. Aiyonna Lucado. Dr. Manuella Ghazi changed the dose of aripiprazole to 10mg . I will verify if this is correct.   Strongly encouraged Mrs. Ludolph to consider compliance packaging of his and her medications to reduce caregiver strain. Their preferred pharmacy, [facility], offers this service for no extra charge.   Goals Addressed            This Visit's Progress   . COMPLETED: "I need help understanding my husbands dementia" (pt-stated)   On track     Clinical Goals: Over the next 14 days, patient will verbalize a better understanding of signs/symptoms of Alzheimer's  Interventions: Practiced active listening and gave emotional support to patient and daughter. Provided basic verbal and written information on Alzheimer's disease.    Marland Kitchen "I need to know what options I have for caregiver resources" (pt-stated)   Not on track     Clinical Goals: Over the next 14 days, patient will verbalize exploring resources given for caregiver support and care options for husband.  Interventions: Provided patient with local caregiver resources including caregiver support group, adult day programs and in-home care options.  2.Please consider joining a support group for caregivers of dementia patients:   [facility]   [address]   [phone] 3.Please explore resources for Dementia Day Care:   [facility] [place] [facility] [place]   [phone]   [email or web address]    [facility])   [address] [place]   [email or web address]  4. Please explore  resources for In home care:   [facility] [place] [facility] [phone]  07/28/18 Provided emotional support and reassurances. Encouraged patient to considered support group and utilize resources for custodial care when ready.      Plan:  Consider compliance packaging for medications.   Ruben Reason, PharmD Clinical Pharmacist [place] [phone]

## 2018-07-29 NOTE — Patient Instructions (Addendum)
1.Please continue to consider joining a support group for caregivers of dementia patients:                         [facility]                         [address]                         [phone] 2.Please consider utilization of custodial resources discussed during our initial face to face meeting 3.Contact Dr. Manuella Ghazi to discuss recent symptoms of tearfulness and depression. 4.Contact the CCM Team with any questions, concerns, or needs 5. Implement the locked box medication practice when your box arrives from [employer]. 6. Consider "pill pack" medications  Goals Addressed    . COMPLETED: "I need help understanding my husbands dementia" (pt-stated)   On track     Clinical Goals: Over the next 14 days, patient will verbalize a better understanding of signs/symptoms of Alzheimer's  Interventions: Practiced active listening and gave emotional support to patient and daughter. Provided basic verbal and written information on Alzheimer's disease.    Marland Kitchen "I need to know what options I have for caregiver resources" (pt-stated)   Not on track     Clinical Goals: Over the next 14 days, patient will verbalize exploring resources given for caregiver support and care options for husband.  Interventions: Provided patient with local caregiver resources including caregiver support group, adult day programs and in-home care options.  2.Please consider joining a support group for caregivers of dementia patients:   [facility]   [address]   [phone] 3.Please explore resources for Dementia Day Care:   [facility] [place] [facility] [place]   [phone]   [email or web address]    [facility])   [address] [place]   [email or web address]  4. Please explore resources for In home care:   [facility] [place] [facility]  [phone]  07/28/18 Provided emotional support and reassurances. Encouraged patient to considered support group and utilize resources for custodial care when ready.    The patient verbalized understanding of instructions provided today and declined a print copy of patient instruction materials.

## 2018-08-22 ENCOUNTER — Ambulatory Visit: Payer: Medicare Other

## 2018-08-22 DIAGNOSIS — F329 Major depressive disorder, single episode, unspecified: Secondary | ICD-10-CM

## 2018-08-22 DIAGNOSIS — E119 Type 2 diabetes mellitus without complications: Secondary | ICD-10-CM

## 2018-08-22 DIAGNOSIS — Z636 Dependent relative needing care at home: Secondary | ICD-10-CM

## 2018-08-22 NOTE — Chronic Care Management (AMB) (Signed)
Chronic Care Management   Follow Up Note   08/22/2018 Name: Lynn Wall MRN: [id number] DOB: [birth year]  Referred by: Lynn Wall., MD Reason for referral : Chronic Care Management (follow up on caregiver strain)    Subjective: "I guess we are doing ok" "I can't really talk right now but you can speak to my daughter Lynn Wall"   Objective:   Assessment: [name] is an 84 year old female patient of Dr. Miguel Wall who was referred to the Chronic Care Management Team by PCP for assistance with managing her caregiver strain related to her husband's diagnosis of Alzheimers and recent change in his behaviors related to memory. Lynn Wall has a history of but not limited to caregiver strain, Lynn Wall, SVT, DM2, and DDD of lumbar. The CCM Team collectively followed up with Lynn Wall and her daughter Lynn Wall today via telephone  Goals Addressed            This Visit's Progress   . "I need to know what options I have for caregiver resources" (pt-stated)   Not on track    CCM RN CM Spoke at great lengths with patients daughter Lynn Wall prior to speaking with [name]. Daughter expresses anxiety and frustration with her father, Lynn Wall "behavior and actions" while out to dinner last night. According to daughter, the family celebrated Lynn Wall birthday at a [employer]. Lynn Wall "obsessively worried" about how the bill was going to be paid. Daughter stated she told Lynn Wall multiple times that the bill was taken care of however he continued to ask. This frustrates daughter and patient. Daughter has found a support group on line which is more convenient than attending a local support group in person. She hopes to find support and education on how to deal with her fathers progressive Alzheimers symptoms. Patient has not yet accepted her need for support. She has been given all resources for caregiver options and support. [name] did not want to speak in great detail today regarding her husband because "he always wants to know who is calling and why". Lynn Wall request that I speak with her daughter. Unsure what else this RN CM can offer for support on this goal.   Clinical Goals: Over the next 14 days, patient will verbalize exploring resources given for caregiver support and care options for husband.  Interventions: Provided patient with local caregiver resources including caregiver support group, adult day programs and in-home care options.  2.Please consider joining a support group for caregivers of dementia patients:   [facility]   [address]   [phone] 3.Please explore resources for Dementia Day Care:   [facility] [place] [facility] [place]   [phone]   [email or web address]    [facility])   [address] [place]   [email or web address].org  4. Please explore resources for In home care:   [facility] [place] [facility] [phone]  07/28/18 Provided emotional support and reassurances. Encouraged patient to considered support group and utilize resources for custodial care when ready.  08/22/18 Provided emotional support and reassurances to daughter and patient. Encouraged patient to considered support group and utilize resources for custodial care when ready. Provided daughter with number to [place] 24 hour dementia hotline and support group. Reviewed basic caregiver education related to Alzheimers and how to deal with progressive symptoms.  Plan: CCM RN CM will follow up with patient next month   Lynn Wall E. Rollene Rotunda, RN, BSN Nurse Care Coordinator [facility] Care Management [phone]

## 2018-08-22 NOTE — Patient Instructions (Addendum)
1. Please consider utilizing resources given for respit care. 2. Contact the CCM Team with any needs or concerns.  CCM (Chronic Care Management) Team   Trish Fountain RN, BSN Nurse Care Coordinator  [phone]  Ruben Reason PharmD  Clinical Pharmacist  [phone]  Goals Addressed            This Visit's Progress   . "I need to know what options I have for caregiver resources" (pt-stated)   Not on track     Clinical Goals: Over the next 14 days, patient will verbalize exploring resources given for caregiver support and care options for husband.  Interventions: Provided patient with local caregiver resources including caregiver support group, adult day programs and in-home care options.  2.Please consider joining a support group for caregivers of dementia patients:   [facility]   [address]   [phone] 3.Please explore resources for Dementia Day Care:   [facility] [place] [facility] [place]   [phone]   [email or web address]    [facility])   [address] [place]   [email or web address]  4. Please explore resources for In home care:   [facility] [place] [facility] [phone]  07/28/18 Provided emotional support and reassurances. Encouraged patient to considered support group and utilize resources for custodial care when ready.  08/22/18 Provided emotional support and reassurances to daughter and patient. Encouraged patient to considered support group and utilize resources for custodial care when ready. Provided daughter with number to [place] 24 hour dementia hotline and support group.  Reviewed basic caregiver education related to Alzheimers and how to deal with progressive symptoms     The patient verbalized understanding of instructions provided today and declined a print copy of patient instruction  materials.

## 2018-09-01 ENCOUNTER — Ambulatory Visit (INDEPENDENT_AMBULATORY_CARE_PROVIDER_SITE_OTHER): Payer: Medicare Other | Admitting: Family Medicine

## 2018-09-01 VITALS — BP 178/86 | HR 67 | Temp 97.8°F | Resp 16 | Wt 137.0 lb

## 2018-09-01 DIAGNOSIS — E119 Type 2 diabetes mellitus without complications: Secondary | ICD-10-CM | POA: Diagnosis not present

## 2018-09-01 DIAGNOSIS — I10 Essential (primary) hypertension: Secondary | ICD-10-CM | POA: Diagnosis not present

## 2018-09-01 DIAGNOSIS — G8929 Other chronic pain: Secondary | ICD-10-CM | POA: Diagnosis not present

## 2018-09-01 DIAGNOSIS — M545 Low back pain: Secondary | ICD-10-CM | POA: Diagnosis not present

## 2018-09-01 DIAGNOSIS — K219 Gastro-esophageal reflux disease without esophagitis: Secondary | ICD-10-CM

## 2018-09-01 DIAGNOSIS — Z9181 History of falling: Secondary | ICD-10-CM

## 2018-09-01 DIAGNOSIS — M5136 Other intervertebral disc degeneration, lumbar region: Secondary | ICD-10-CM

## 2018-09-01 MED ORDER — TRAMADOL HCL 50 MG PO TABS: 100.0000 mg | ORAL_TABLET | Freq: Four times a day (QID) | ORAL | 3 refills | Status: DC | PRN

## 2018-09-01 NOTE — Progress Notes (Signed)
Lynn Wall  MRN: [id number] DOB: May 07, 1927  Subjective:  HPI   The patient is an 85 year old female who presents for follow up of chronic issues.  She states she needs refill on her pain medications.  She continues to have trouble walking and is very unsteady.  He is using a cane but her daughter thinks she may do better with a walker or a Rollator. Her husband has progressive dementia and is very difficult for them to leave him alone.  He is not violent or have any behavioral issues but is very argumentative at times.  She also complains that she has been having a headache at night.  It is mostly on the right side and it may involve some ear pain. On specific questioning it is appears to start in the occiput and go around the back right side of the head.  May be related to her pillow and sleeping. Patient Active Problem List   Diagnosis Date Noted  . Basal cell carcinoma 01/17/2016  . CN (constipation) 05/23/2015  . DDD (degenerative disc disease), lumbar 05/23/2015  . Diabetes mellitus type 2 without retinopathy ([place]) 05/23/2015  . Fatigue 05/23/2015  . Acid reflux 05/23/2015  . LBP (low back pain) 05/23/2015  . Drusen of macula 05/23/2015  . Neuralgia neuritis, sciatic nerve 05/23/2015  . Basal cell papilloma 05/23/2015  . Acute urinary retention 03/28/2015  . Lumbar foraminal stenosis 03/14/2015  . Chronic cystitis 03/03/2015  . Cystocele, grade 2 02/18/2015  . History of kidney stones 02/18/2015  . Atrophic vaginitis 02/15/2015  . Recurrent UTI 02/15/2015  . Displacement of lumbar intervertebral disc without myelopathy 12/01/2014  . Bulge of lumbar disc without myelopathy 12/01/2014  . Lumbar radiculitis 11/10/2014  . Internal hemorrhoids without complication 50/27/7412  . Flutter-fibrillation ([place]) 03/22/2008  . Paroxysmal supraventricular tachycardia ([place]) 03/22/2008  . Colon, diverticulosis 10/28/2007  . History of colon polyps 10/28/2007  . Malignant  neoplasm of skin of parts of face 10/28/2007  . Calculus of kidney 10/18/2007  . Benign essential HTN 10/18/2007  . OP (osteoporosis) 10/18/2007  . Diabetes ([place]) 10/16/2007  . Hypercholesteremia 10/16/2007    Past Medical History:  Diagnosis Date  . Acid reflux   . Allergy   . Cystitis   . DDD (degenerative disc disease), lumbar   . Diabetes mellitus without complication ([place])   . GERD (gastroesophageal reflux disease)   . History of kidney stones   . History of renal stone   . Hyperlipidemia   . Hypertension   . Osteoporosis   . Urinary incontinence   . UTI (urinary tract infection)    currently on antibiotic    Social History   Socioeconomic History  . Marital status: Married    Spouse name: Not on file  . Number of children: 3  . Years of education: Not on file  . Highest education level: 8th grade  Occupational History  . Occupation: retired  Scientific laboratory technician  . Financial resource strain: Not hard at all  . Food insecurity:    Worry: Never true    Inability: Never true  . Transportation needs:    Medical: No    Non-medical: No  Tobacco Use  . Smoking status: Never Smoker  . Smokeless tobacco: Never Used  Substance and Sexual Activity  . Alcohol use: No    Alcohol/week: 0.0 standard drinks  . Drug use: No  . Sexual activity: Not Currently  Lifestyle  . Physical activity:    Days  per week: Not on file    Minutes per session: Not on file  . Stress: To some extent  Relationships  . Social connections:    Talks on phone: Not on file    Gets together: Not on file    Attends religious service: Not on file    Active member of club or organization: Not on file    Attends meetings of clubs or organizations: Not on file    Relationship status: Not on file  . Intimate partner violence:    Fear of current or ex partner: Not on file    Emotionally abused: Not on file    Physically abused: Not on file    Forced sexual activity: Not on file  Other Topics Concern   . Not on file  Social History Narrative  . Not on file    Outpatient Encounter Medications as of 09/01/2018  Medication Sig Note  . aspirin EC 81 MG tablet Take 81 mg by mouth daily.   Marland Kitchen docusate sodium (STOOL SOFTENER) 100 MG capsule Take 1 capsule (100 mg total) by mouth daily.   . enalapril (VASOTEC) 10 MG tablet Take 10 mg by mouth daily.   Marland Kitchen gabapentin (NEURONTIN) 100 MG capsule TAKE 1 CAPSULE BY MOUTH TWICE A DAY   . linaclotide (LINZESS) 72 MCG capsule Take 1 capsule (72 mcg total) by mouth daily before breakfast.   . lisinopril (PRINIVIL,ZESTRIL) 10 MG tablet TAKE 1 TABLET BY MOUTH EVERY DAY   . meloxicam (MOBIC) 15 MG tablet Take 1 tablet by mouth daily. 05/14/2016: Received from: External Pharmacy  . metoprolol tartrate (LOPRESSOR) 25 MG tablet TAKE 1 TABLET TWICE A DAY   . omeprazole (PRILOSEC) 20 MG capsule Take 1 capsule (20 mg total) by mouth daily.   . ondansetron (ZOFRAN) 4 MG tablet 1/2 to 1 tablet every 6 hours as needed nausea 09/07/2016: PRN  . polyethylene glycol (MIRALAX / GLYCOLAX) packet Take 17 g by mouth daily.   . simvastatin (ZOCOR) 40 MG tablet TAKE 1 TABLET BY MOUTH AT BEDTIME   . traMADol (ULTRAM) 50 MG tablet Take 1 tablet (50 mg total) by mouth every 6 (six) hours as needed.   . traZODone (DESYREL) 50 MG tablet TAKE 1 TABLET BY MOUTH EVERYDAY AT BEDTIME   . clidinium-chlordiazePOXIDE (LIBRAX) 5-2.5 MG capsule Take 1 capsule by mouth 2 (two) times daily as needed. (Patient not taking: Reported on 09/01/2018)   . [DISCONTINUED] hydrocortisone (ANUSOL-HC) 2.5 % rectal cream Place 1 application rectally 2 (two) times daily. (Patient not taking: Reported on 01/14/2018)   . [DISCONTINUED] hydrocortisone (ANUSOL-HC) 25 MG suppository PLACE 1 SUPPOSITORY (25 MG TOTAL) RECTALLY AT BEDTIME. 08/21/2016: Received from: External Pharmacy  . [DISCONTINUED] hydrocortisone-pramoxine (PROCTOFOAM-HC) rectal foam Place 1 applicator rectally 2 (two) times daily. (Patient not taking:  Reported on 01/14/2018)   . [DISCONTINUED] mirtazapine (REMERON) 30 MG tablet Take 1 tablet (30 mg total) by mouth at bedtime. (Patient not taking: Reported on 02/10/2018)   . [DISCONTINUED] predniSONE (DELTASONE) 10 MG tablet prednisone 10 mg tablets in a dose pack  take as directed   . [DISCONTINUED] raloxifene (EVISTA) 60 MG tablet Take by mouth.    No facility-administered encounter medications on file as of 09/01/2018.     Allergies  Allergen Reactions  . Remeron [Mirtazapine]     Dizziness and confusion     Review of Systems  Constitutional: Positive for malaise/fatigue.  HENT: Negative for ear pain.   Eyes: Negative.   Respiratory: Negative  for cough, shortness of breath and wheezing.   Cardiovascular: Negative for chest pain, palpitations and leg swelling (ankle swelling).  Gastrointestinal: Negative.   Musculoskeletal: Positive for back pain and myalgias.       Leg pain, right greater than left due to sciatica.  Right upper arm and shoulder pain with Abduction  Skin: Negative.   Neurological: Positive for dizziness and headaches.  Endo/Heme/Allergies: Negative.   Psychiatric/Behavioral: Negative.     Objective:  BP (!) 178/86 (BP Location: Right Arm, Patient Position: Sitting, Cuff Size: Normal)   Pulse 67   Temp 97.8 F (36.6 C) (Oral)   Resp 16   Wt 137 lb (62.1 kg)   LMP  (LMP Unknown)   SpO2 97%   BMI 24.27 kg/m   Physical Exam  Constitutional: She is oriented to person, place, and time and well-developed, well-nourished, and in no distress.  HENT:  Head: Normocephalic and atraumatic.  Right Ear: External ear normal.  Left Ear: External ear normal.  Nose: Nose normal.  Eyes: Conjunctivae are normal.  Neck: No thyromegaly present.  Cardiovascular: Normal rate, regular rhythm and normal heart sounds.  Pulmonary/Chest: Effort normal and breath sounds normal.  Musculoskeletal:        General: No edema.  Neurological: She is alert and oriented to person,  place, and time. Gait normal. GCS score is 15.  Skin: Skin is warm and dry.  Psychiatric: Mood, memory, affect and judgment normal.    Assessment and Plan :  1. Chronic midline low back pain, unspecified whether sciatica present Followed by hand surgery and by physiatry. - Ambulatory referral to Physical Therapy  2. Risk for falls Due to pain patient is moving less.  However, due to her husband's progressive Alzheimer's disease the only way to get her therapy is through home therapy.  We will try to arrange this. - Ambulatory referral to [facility] - Ambulatory referral to Physical Therapy  3. Benign essential HTN Recheck blood pressure 160/90.  She is in pain today.  We will follow-up on next visit.  I am worried about orthostasis up with pusher any further at was 85 years old.  4. Gastroesophageal reflux disease without esophagitis   5. Diabetes mellitus type 2 without retinopathy ([place]) A1c on next visit.  6. DDD (degenerative disc disease), lumbar   I have done the exam and reviewed the chart and it is accurate to the best of my knowledge. Development worker, community has been used and  any errors in dictation or transcription are unintentional. Miguel Aschoff M.D. [facility]

## 2018-09-03 ENCOUNTER — Other Ambulatory Visit: Payer: Self-pay | Admitting: Family Medicine

## 2018-09-03 DIAGNOSIS — M545 Low back pain, unspecified: Secondary | ICD-10-CM

## 2018-09-03 DIAGNOSIS — Z9181 History of falling: Secondary | ICD-10-CM

## 2018-09-03 DIAGNOSIS — G8929 Other chronic pain: Secondary | ICD-10-CM

## 2018-09-06 DIAGNOSIS — M5116 Intervertebral disc disorders with radiculopathy, lumbar region: Secondary | ICD-10-CM | POA: Diagnosis not present

## 2018-09-06 DIAGNOSIS — E119 Type 2 diabetes mellitus without complications: Secondary | ICD-10-CM | POA: Diagnosis not present

## 2018-09-06 DIAGNOSIS — M810 Age-related osteoporosis without current pathological fracture: Secondary | ICD-10-CM | POA: Diagnosis not present

## 2018-09-06 DIAGNOSIS — I10 Essential (primary) hypertension: Secondary | ICD-10-CM | POA: Diagnosis not present

## 2018-09-06 DIAGNOSIS — M545 Low back pain: Secondary | ICD-10-CM | POA: Diagnosis not present

## 2018-09-06 DIAGNOSIS — N3020 Other chronic cystitis without hematuria: Secondary | ICD-10-CM | POA: Diagnosis not present

## 2018-09-08 DIAGNOSIS — E119 Type 2 diabetes mellitus without complications: Secondary | ICD-10-CM | POA: Diagnosis not present

## 2018-09-08 DIAGNOSIS — M545 Low back pain: Secondary | ICD-10-CM | POA: Diagnosis not present

## 2018-09-08 DIAGNOSIS — N3020 Other chronic cystitis without hematuria: Secondary | ICD-10-CM | POA: Diagnosis not present

## 2018-09-08 DIAGNOSIS — I10 Essential (primary) hypertension: Secondary | ICD-10-CM | POA: Diagnosis not present

## 2018-09-08 DIAGNOSIS — M5116 Intervertebral disc disorders with radiculopathy, lumbar region: Secondary | ICD-10-CM | POA: Diagnosis not present

## 2018-09-08 DIAGNOSIS — M810 Age-related osteoporosis without current pathological fracture: Secondary | ICD-10-CM | POA: Diagnosis not present

## 2018-09-12 DIAGNOSIS — N3020 Other chronic cystitis without hematuria: Secondary | ICD-10-CM | POA: Diagnosis not present

## 2018-09-12 DIAGNOSIS — M545 Low back pain: Secondary | ICD-10-CM | POA: Diagnosis not present

## 2018-09-12 DIAGNOSIS — M810 Age-related osteoporosis without current pathological fracture: Secondary | ICD-10-CM | POA: Diagnosis not present

## 2018-09-12 DIAGNOSIS — E119 Type 2 diabetes mellitus without complications: Secondary | ICD-10-CM | POA: Diagnosis not present

## 2018-09-12 DIAGNOSIS — M5116 Intervertebral disc disorders with radiculopathy, lumbar region: Secondary | ICD-10-CM | POA: Diagnosis not present

## 2018-09-12 DIAGNOSIS — I10 Essential (primary) hypertension: Secondary | ICD-10-CM | POA: Diagnosis not present

## 2018-09-13 DIAGNOSIS — I10 Essential (primary) hypertension: Secondary | ICD-10-CM | POA: Diagnosis not present

## 2018-09-13 DIAGNOSIS — E119 Type 2 diabetes mellitus without complications: Secondary | ICD-10-CM | POA: Diagnosis not present

## 2018-09-13 DIAGNOSIS — M5116 Intervertebral disc disorders with radiculopathy, lumbar region: Secondary | ICD-10-CM | POA: Diagnosis not present

## 2018-09-13 DIAGNOSIS — M545 Low back pain: Secondary | ICD-10-CM | POA: Diagnosis not present

## 2018-09-13 DIAGNOSIS — M810 Age-related osteoporosis without current pathological fracture: Secondary | ICD-10-CM | POA: Diagnosis not present

## 2018-09-13 DIAGNOSIS — N3020 Other chronic cystitis without hematuria: Secondary | ICD-10-CM | POA: Diagnosis not present

## 2018-09-17 DIAGNOSIS — N3020 Other chronic cystitis without hematuria: Secondary | ICD-10-CM | POA: Diagnosis not present

## 2018-09-17 DIAGNOSIS — I10 Essential (primary) hypertension: Secondary | ICD-10-CM | POA: Diagnosis not present

## 2018-09-17 DIAGNOSIS — M810 Age-related osteoporosis without current pathological fracture: Secondary | ICD-10-CM | POA: Diagnosis not present

## 2018-09-17 DIAGNOSIS — M545 Low back pain: Secondary | ICD-10-CM | POA: Diagnosis not present

## 2018-09-17 DIAGNOSIS — M5116 Intervertebral disc disorders with radiculopathy, lumbar region: Secondary | ICD-10-CM | POA: Diagnosis not present

## 2018-09-17 DIAGNOSIS — E119 Type 2 diabetes mellitus without complications: Secondary | ICD-10-CM | POA: Diagnosis not present

## 2018-09-18 DIAGNOSIS — N3020 Other chronic cystitis without hematuria: Secondary | ICD-10-CM | POA: Diagnosis not present

## 2018-09-18 DIAGNOSIS — E119 Type 2 diabetes mellitus without complications: Secondary | ICD-10-CM | POA: Diagnosis not present

## 2018-09-18 DIAGNOSIS — M810 Age-related osteoporosis without current pathological fracture: Secondary | ICD-10-CM | POA: Diagnosis not present

## 2018-09-18 DIAGNOSIS — M545 Low back pain: Secondary | ICD-10-CM | POA: Diagnosis not present

## 2018-09-18 DIAGNOSIS — I10 Essential (primary) hypertension: Secondary | ICD-10-CM | POA: Diagnosis not present

## 2018-09-18 DIAGNOSIS — M5116 Intervertebral disc disorders with radiculopathy, lumbar region: Secondary | ICD-10-CM | POA: Diagnosis not present

## 2018-09-22 DIAGNOSIS — M5116 Intervertebral disc disorders with radiculopathy, lumbar region: Secondary | ICD-10-CM | POA: Diagnosis not present

## 2018-09-22 DIAGNOSIS — M545 Low back pain: Secondary | ICD-10-CM | POA: Diagnosis not present

## 2018-09-22 DIAGNOSIS — E119 Type 2 diabetes mellitus without complications: Secondary | ICD-10-CM | POA: Diagnosis not present

## 2018-09-22 DIAGNOSIS — N3020 Other chronic cystitis without hematuria: Secondary | ICD-10-CM | POA: Diagnosis not present

## 2018-09-22 DIAGNOSIS — I10 Essential (primary) hypertension: Secondary | ICD-10-CM | POA: Diagnosis not present

## 2018-09-22 DIAGNOSIS — M810 Age-related osteoporosis without current pathological fracture: Secondary | ICD-10-CM | POA: Diagnosis not present

## 2018-10-01 ENCOUNTER — Telehealth: Payer: Self-pay

## 2018-10-02 ENCOUNTER — Telehealth: Payer: Self-pay

## 2018-10-03 ENCOUNTER — Ambulatory Visit (INDEPENDENT_AMBULATORY_CARE_PROVIDER_SITE_OTHER): Payer: Medicare Other

## 2018-10-03 DIAGNOSIS — I10 Essential (primary) hypertension: Secondary | ICD-10-CM

## 2018-10-03 DIAGNOSIS — Z9181 History of falling: Secondary | ICD-10-CM

## 2018-10-03 DIAGNOSIS — Z636 Dependent relative needing care at home: Secondary | ICD-10-CM

## 2018-10-03 DIAGNOSIS — M545 Low back pain, unspecified: Secondary | ICD-10-CM

## 2018-10-03 DIAGNOSIS — G8929 Other chronic pain: Secondary | ICD-10-CM

## 2018-10-03 NOTE — Chronic Care Management (AMB) (Signed)
Chronic Care Management   Follow Up Note   10/03/2018 Name: Lynn Wall MRN: [id number] DOB: 03-19-1927  Referred by: Jerrol Banana., MD Reason for referral : Chronic Care Management (follow up with daughter)   Subjective: "Things are not getting better"   Objective:  Assessment: Assessment: [name] is an 85 year old female patient of Dr. Miguel Aschoff who was referred to the Chronic Care Management Team by PCP for assistance with managing her caregiver strain related to her husband's diagnosis of Alzheimers and recent change in his behaviors related to memory. [name] has a history of but not limited to caregiver strain, [facility], SVT, DM2, and DDD of lumbar.CCM RN CM follow up with daughter Lynn Wall today as patient always directs RN CM to call daughter.  Lynn Wall is ready to obtain custodial care for patient however she is allowing her mother to make the decision. The home is not always safe as patient leaves her medications out in reach of her husband who has taken her medications before. I cautioned daughter against leaving Mr. Bergman alone in the home as RN CM feels it is an unsafe environment. Wife is not acceptant to home care or dementia day program. Mr. Modisette does not want to leave the home as family observes it causes him significant anziety and behavior disturbance however because patient wants husband to go out to restaurants, they medicate him with klonopin. Daughter states patient is making best decisions for herself and not thinking of Mr. Suber however daughter is not willing to make healthy decisions for them both. Daughter admits that there is significant family strain which is causing her to seek medical care for anxiety and insomnia.  Patient and daughter have been provided an abundance of recourses for custodial care and support. There is no further assistance CCM RN CM can provide at this time related to this goal.   Goals  Addressed            This Visit's Progress   . COMPLETED: "I need to know what options I have for caregiver resources" (pt-stated)        Clinical Goals: Over the next 14 days, patient will verbalize exploring resources given for caregiver support and care options for husband.  Interventions: Provided patient with local caregiver resources including caregiver support group, adult day programs and in-home care options.  2.Please consider joining a support group for caregivers of dementia patients:   [facility]   [address]   [phone] 3.Please explore resources for Dementia Day Care:   [facility] [place] [facility] [place]   [phone]   [email or web address]    [facility])   [address], [place]   [email or web address]  4. Please explore resources for In home care:   [facility] [place] [facility] [phone]  07/28/18 Provided emotional support and reassurances. Encouraged patient to considered support group and utilize resources for custodial care when ready.  08/22/18 Provided emotional support and reassurances to daughter and patient. Encouraged patient to considered support group and utilize resources for custodial care when ready. Provided daughter with number to [place] 24 hour dementia hotline and support group.  Reviewed basic caregiver education related to Alzheimers and how to deal with progressive symptoms  10/03/2018 Provided emotional support and reassurance. Open discussion on importance of home safety verses meeting wife's emotional needs  for patient to stay in the home.       [name]/daughter Lynn Wall has met their goal of understanding resources available for caregiver strain. Review of patient status, including review of consultants reports, relevant laboratory and other test results, and  collaboration with appropriate care team members and the patient's provider was performed as part of comprehensive patient evaluation and provision of chronic care management services.  The care management team is available to [name] at any time to assist with care management needs should they arise. Ms. has been given contact information and instructions to contact the care management team with any questions or should new care management needs arise.    Sal Spratley E. Rollene Rotunda, RN, BSN Nurse Care Coordinator [facility] Care Management [phone]

## 2018-10-03 NOTE — Patient Instructions (Signed)
1. Please consider utilizing resources given for respit care. 2. Contact the CCM Team with any needs or concerns.  Goals Addressed            This Visit's Progress   . COMPLETED: "I need to know what options I have for caregiver resources" (pt-stated)        Clinical Goals: Over the next 14 days, patient will verbalize exploring resources given for caregiver support and care options for husband.  Interventions: Provided patient with local caregiver resources including caregiver support group, adult day programs and in-home care options.  2.Please consider joining a support group for caregivers of dementia patients:   [facility]   [address]   [phone] 3.Please explore resources for Dementia Day Care:   [facility] [address] [place]   [phone]   [email or web address]    [facility])   [address] [place]   [email or web address]  4. Please explore resources for In home care:   [facility] [phone]  07/28/18 Provided emotional support and reassurances. Encouraged patient to considered support group and utilize resources for custodial care when ready.  08/22/18 Provided emotional support and reassurances to daughter and patient. Encouraged patient to considered support group and utilize resources for custodial care when ready. Provided daughter with number to [place] 24 hour dementia hotline and support group.  Reviewed basic caregiver education related to Alzheimers and how to deal with progressive symptoms  10/03/2018 Provided emotional support and reassurance. Open discussion on importance of home safety verses meeting wife's emotional needs for patient to stay in the home.

## 2018-12-11 ENCOUNTER — Other Ambulatory Visit: Payer: Self-pay | Admitting: Family Medicine

## 2018-12-11 DIAGNOSIS — M25511 Pain in right shoulder: Principal | ICD-10-CM

## 2018-12-11 DIAGNOSIS — G8929 Other chronic pain: Secondary | ICD-10-CM

## 2018-12-11 DIAGNOSIS — M25512 Pain in left shoulder: Principal | ICD-10-CM

## 2019-02-02 ENCOUNTER — Ambulatory Visit: Payer: Self-pay | Admitting: Family Medicine

## 2019-02-16 ENCOUNTER — Telehealth: Payer: Self-pay

## 2019-02-16 NOTE — Telephone Encounter (Signed)
Patients daughter who is caregiver is requesting call back from nurse health advisor to find out when she can reschedule patients appointment. KW

## 2019-02-17 DIAGNOSIS — H3530 Unspecified macular degeneration: Secondary | ICD-10-CM | POA: Diagnosis not present

## 2019-02-17 LAB — HM DIABETES EYE EXAM

## 2019-02-19 ENCOUNTER — Ambulatory Visit: Payer: Medicare Other

## 2019-02-19 ENCOUNTER — Other Ambulatory Visit: Payer: Self-pay

## 2019-02-19 ENCOUNTER — Ambulatory Visit (INDEPENDENT_AMBULATORY_CARE_PROVIDER_SITE_OTHER): Payer: Medicare Other

## 2019-02-19 DIAGNOSIS — Z0000 Encounter for general adult medical examination without abnormal findings: Secondary | ICD-10-CM | POA: Diagnosis not present

## 2019-02-19 NOTE — Progress Notes (Signed)
Subjective:   Lynn Wall is a 85 y.o. female who presents for Medicare Annual (Subsequent) preventive examination.    This visit is being conducted through telemedicine due to the COVID-19 pandemic. This patient has given me verbal consent via doximity to conduct this visit, patient states they are participating from their home address. Some vital signs may be absent or patient reported.    Patient identification: identified by name, DOB, and current address  Review of Systems:  N/A  Cardiac Risk Factors include: advanced age (>19men, >30 women)     Objective:     Vitals: LMP  (LMP Unknown)   There is no height or weight on file to calculate BMI. Unable to obtain vitals due to visit being conducted via telephonically.   Advanced Directives 02/19/2019 02/10/2018 01/10/2017 03/07/2015  Does Patient Have a Medical Advance Directive? Yes Yes No;Yes Yes  Type of Paramedic of [place];Living will [place];Living will [place];Living will [place] in Chart? Yes - validated most recent copy scanned in chart (See row information) Yes No - copy requested Yes    Tobacco Social History   Tobacco Use  Smoking Status Never Smoker  Smokeless Tobacco Never Used     Counseling given: Not Answered   Clinical Intake:  Pre-visit preparation completed: Yes  Pain : 0-10 Pain Score: 3  Pain Type: Chronic pain Pain Location: Back Pain Orientation: Lower Pain Descriptors / Indicators: Constant, Throbbing Pain Frequency: Constant     Nutritional Risks: None Diabetes: No  How often do you need to have someone help you when you read instructions, pamphlets, or other written materials from your doctor or pharmacy?: 1 - Never  Interpreter Needed?: No  Information entered by :: [facility], LPN  Past Medical History:  Diagnosis Date  . Acid reflux   . Allergy    . Cystitis   . DDD (degenerative disc disease), lumbar   . Diabetes mellitus without complication ([place])   . GERD (gastroesophageal reflux disease)   . History of kidney stones   . History of renal stone   . Hyperlipidemia   . Hypertension   . Osteoporosis   . Urinary incontinence   . UTI (urinary tract infection)    currently on antibiotic   Past Surgical History:  Procedure Laterality Date  . ABDOMINAL HYSTERECTOMY    . CATARACT EXTRACTION, BILATERAL    . HEMORRHOID SURGERY    . LUMBAR LAMINECTOMY/DECOMPRESSION MICRODISCECTOMY Left 03/14/2015   Procedure: LUMBAR LAMINECTOMY/DECOMPRESSION MICRODISCECTOMY 1 LEVEL;  Surgeon: Newman Pies, MD;  Location: [facility];  Service: Neurosurgery;  Laterality: Left;  Left L34 microdiskectomy   Family History  Problem Relation Age of Onset  . Cancer Father   . Kidney disease Neg Hx   . Prostate cancer Neg Hx   . Bladder Cancer Neg Hx    Social History   Socioeconomic History  . Marital status: Married    Spouse name: Not on file  . Number of children: 3  . Years of education: Not on file  . Highest education level: 8th grade  Occupational History  . Occupation: retired  Scientific laboratory technician  . Financial resource strain: Not hard at all  . Food insecurity    Worry: Never true    Inability: Never true  . Transportation needs    Medical: No    Non-medical: No  Tobacco Use  . Smoking status: Never  Smoker  . Smokeless tobacco: Never Used  Substance and Sexual Activity  . Alcohol use: No    Alcohol/week: 0.0 standard drinks  . Drug use: No  . Sexual activity: Not Currently  Lifestyle  . Physical activity    Days per week: 0 days    Minutes per session: 0 min  . Stress: Not at all  Relationships  . Social Herbalist on phone: Patient refused    Gets together: Patient refused    Attends religious service: Patient refused    Active member of club or organization: Patient refused    Attends meetings of clubs or  organizations: Patient refused    Relationship status: Patient refused  Other Topics Concern  . Not on file  Social History Narrative  . Not on file    Outpatient Encounter Medications as of 02/19/2019  Medication Sig  . aspirin EC 81 MG tablet Take 81 mg by mouth daily.  . clidinium-chlordiazePOXIDE (LIBRAX) 5-2.5 MG capsule Take 1 capsule by mouth 2 (two) times daily as needed.  . docusate sodium (STOOL SOFTENER) 100 MG capsule Take 1 capsule (100 mg total) by mouth daily.  . enalapril (VASOTEC) 10 MG tablet Take 10 mg by mouth daily.  Marland Kitchen gabapentin (NEURONTIN) 100 MG capsule TAKE 1 CAPSULE BY MOUTH TWICE A DAY  . lisinopril (PRINIVIL,ZESTRIL) 10 MG tablet TAKE 1 TABLET BY MOUTH EVERY DAY  . meloxicam (MOBIC) 15 MG tablet Take 1 tablet by mouth daily.  . metoprolol tartrate (LOPRESSOR) 25 MG tablet TAKE 1 TABLET TWICE A DAY  . omeprazole (PRILOSEC) 20 MG capsule Take 1 capsule (20 mg total) by mouth daily.  . polyethylene glycol (MIRALAX / GLYCOLAX) packet Take 17 g by mouth daily. (Patient taking differently: Take 17 g by mouth daily as needed. )  . simvastatin (ZOCOR) 40 MG tablet TAKE 1 TABLET BY MOUTH AT BEDTIME  . traMADol (ULTRAM) 50 MG tablet Take 2 tablets (100 mg total) by mouth every 6 (six) hours as needed.  . traZODone (DESYREL) 50 MG tablet TAKE 1 TABLET BY MOUTH EVERYDAY AT BEDTIME  . linaclotide (LINZESS) 72 MCG capsule Take 1 capsule (72 mcg total) by mouth daily before breakfast. (Patient not taking: Reported on 02/19/2019)  . ondansetron (ZOFRAN) 4 MG tablet 1/2 to 1 tablet every 6 hours as needed nausea (Patient not taking: Reported on 02/19/2019)   No facility-administered encounter medications on file as of 02/19/2019.     Activities of Daily Living In your present state of health, do you have any difficulty performing the following activities: 02/19/2019  Hearing? N  Vision? N  Comment Wears eye glasses.  Difficulty concentrating or making decisions? Y  Walking or  climbing stairs? Y  Comment Due back pain.  Dressing or bathing? N  Doing errands, shopping? Y  Comment Does not drive.  Preparing Food and eating ? N  Using the Toilet? N  In the past six months, have you accidently leaked urine? Y  Comment Occasionally during the night.  Do you have problems with loss of bowel control? N  Managing your Medications? N  Managing your Finances? Y  Comment Daughter manages finances.  Housekeeping or managing your Housekeeping? Y  Comment Has assistance with housework from daughter.  Some recent data might be hidden    Patient Care Team: Jerrol Banana., MD as PCP - General (Family Medicine) Benedetto Goad, RN as Case Manager Cathi Roan, [facility] (Pharmacist)    Assessment:  This is a routine wellness examination for [place].  Exercise Activities and Dietary recommendations Current Exercise Habits: The patient does not participate in regular exercise at present, Exercise limited by: None identified  Goals    . DIET - INCREASE WATER INTAKE     Recommend increasing water intake to 6 glasses a day.       Fall Risk: Fall Risk  02/10/2018 01/14/2018 01/10/2017 11/07/2015  Falls in the past year? Yes No Yes No  Number falls in past yr: 1 - 1 -  Injury with Fall? No - No -  Follow up Falls prevention discussed - Falls prevention discussed -    FALL RISK PREVENTION PERTAINING TO THE HOME:  Any stairs in or around the home? Yes  If so, are there any without handrails? No   Home free of loose throw rugs in walkways, pet beds, electrical cords, etc? Yes  Adequate lighting in your home to reduce risk of falls? Yes   ASSISTIVE DEVICES UTILIZED TO PREVENT FALLS:  Life alert? No  Use of a cane, walker or w/c? Yes  Grab bars in the bathroom? Yes  Shower chair or bench in shower? Yes  Elevated toilet seat or a handicapped toilet? Yes    TIMED UP AND GO:  Was the test performed? No .    Depression Screen PHQ 2/9 Scores 02/19/2019  02/10/2018 01/15/2018 01/14/2018  PHQ - 2 Score 4 1 2 1   PHQ- 9 Score 14 - 13 -     Cognitive Function: Declined today.      6CIT Screen 01/10/2017  What Year? 0 points  What month? 0 points  What time? 0 points  Count back from 20 0 points  Months in reverse 0 points  Repeat phrase 0 points  Total Score 0    Immunization History  Administered Date(s) Administered  . Influenza, High Dose Seasonal PF 05/23/2015, 08/06/2016, 07/23/2017, 07/01/2018  . Pneumococcal Conjugate-13 01/10/2017  . Pneumococcal Polysaccharide-23 02/10/2018  . Tdap 03/27/2012    Qualifies for Shingles Vaccine? Yes  Zostavax completed 06/04/13. Due for Shingrix. Education has been provided regarding the importance of this vaccine. Pt has been advised to call insurance company to determine out of pocket expense. Advised may also receive vaccine at local pharmacy or Health Dept. Verbalized acceptance and understanding.  Tdap: Up to date  Flu Vaccine: Up to date  Pneumococcal Vaccine: Completed series  Screening Tests Health Maintenance  Topic Date Due  . INFLUENZA VACCINE  03/28/2019  . DEXA SCAN  11/08/2019  . TETANUS/TDAP  03/27/2022  . PNA vac Low Risk Adult  Completed    Cancer Screenings:  Colorectal Screening: No longer required.   Mammogram: No longer required.   Bone Density: Completed 11/08/14. Results reflect OSTEOPENIA. Repeat every 5 years.   Lung Cancer Screening: (Low Dose CT Chest recommended if Age 67-80 years, 30 pack-year currently smoking OR have quit w/in 15years.) does not qualify.   Additional Screening:  Vision Screening: Recommended annual ophthalmology exams for early detection of glaucoma and other disorders of the eye.  Dental Screening: Recommended annual dental exams for proper oral hygiene  Community Resource Referral:  CRR required this visit?  No       Plan:  I have personally reviewed and addressed the Medicare Annual Wellness questionnaire and have noted  the following in the patient's chart:  A. Medical and social history B. Use of alcohol, tobacco or illicit drugs  C. Current medications and supplements D. Functional ability and  status E.  Nutritional status F.  Physical activity G. Advance directives H. List of other physicians I.  Hospitalizations, surgeries, and ER visits in previous 12 months J.  [place] such as hearing and vision if needed, cognitive and depression L. Referrals and appointments   In addition, I have reviewed and discussed with patient certain preventive protocols, quality metrics, and best practice recommendations. A written personalized care plan for preventive services as well as general preventive health recommendations were provided to patient. Nurse Health Advisor  Signed,    Kyan Giannone [place], [place]  3/83/[birth year]19 Nurse Health Advisor   Nurse Notes: None.

## 2019-02-19 NOTE — Patient Instructions (Signed)
Lynn Wall , Thank you for taking time to come for your Medicare Wellness Visit. I appreciate your ongoing commitment to your health goals. Please review the following plan we discussed and let me know if I can assist you in the future.   Screening recommendations/referrals: Colonoscopy: No longer required.  Mammogram: No longer required.  Bone Density: Up to date, due 10/2019 Recommended yearly ophthalmology/optometry visit for glaucoma screening and checkup Recommended yearly dental visit for hygiene and checkup  Vaccinations: Influenza vaccine: Up to date Pneumococcal vaccine: Completed series Tdap vaccine: Up to date, due 03/2022 Shingles vaccine: Pt declines today.     Advanced directives: Currently on file.   Conditions/risks identified: Continue to increase water intake to 6-8 8 oz glasses a day.   Next appointment: 03/18/19 with Dr Rosanna Randy.    Preventive Care 58 Years and Older, Female Preventive care refers to lifestyle choices and visits with your health care provider that can promote health and wellness. What does preventive care include?  A yearly physical exam. This is also called an annual well check.  Dental exams once or twice a year.  Routine eye exams. Ask your health care provider how often you should have your eyes checked.  Personal lifestyle choices, including:  Daily care of your teeth and gums.  Regular physical activity.  Eating a healthy diet.  Avoiding tobacco and drug use.  Limiting alcohol use.  Practicing safe sex.  Taking low-dose aspirin every day.  Taking vitamin and mineral supplements as recommended by your health care provider. What happens during an annual well check? The services and screenings done by your health care provider during your annual well check will depend on your age, overall health, lifestyle risk factors, and family history of disease. Counseling  Your health care provider may ask you questions about your:   Alcohol use.  Tobacco use.  Drug use.  Emotional well-being.  Home and relationship well-being.  Sexual activity.  Eating habits.  History of falls.  Memory and ability to understand (cognition).  Work and work Statistician.  Reproductive health. Screening  You may have the following tests or measurements:  Height, weight, and BMI.  Blood pressure.  Lipid and cholesterol levels. These may be checked every 5 years, or more frequently if you are over 49 years old.  Skin check.  Lung cancer screening. You may have this screening every year starting at age 67 if you have a 30-pack-year history of smoking and currently smoke or have quit within the past 15 years.  Fecal occult blood test (FOBT) of the stool. You may have this test every year starting at age 91.  Flexible sigmoidoscopy or colonoscopy. You may have a sigmoidoscopy every 5 years or a colonoscopy every 10 years starting at age 80.  Hepatitis C blood test.  Hepatitis B blood test.  Sexually transmitted disease (STD) testing.  Diabetes screening. This is done by checking your blood sugar (glucose) after you have not eaten for a while (fasting). You may have this done every 1-3 years.  Bone density scan. This is done to screen for osteoporosis. You may have this done starting at age 49.  Mammogram. This may be done every 1-2 years. Talk to your health care provider about how often you should have regular mammograms. Talk with your health care provider about your test results, treatment options, and if necessary, the need for more tests. Vaccines  Your health care provider may recommend certain vaccines, such as:  Influenza vaccine. This  is recommended every year.  Tetanus, diphtheria, and acellular pertussis (Tdap, Td) vaccine. You may need a Td booster every 10 years.  Zoster vaccine. You may need this after age 32.  Pneumococcal 13-valent conjugate (PCV13) vaccine. One dose is recommended after age  58.  Pneumococcal polysaccharide (PPSV23) vaccine. One dose is recommended after age 15. Talk to your health care provider about which screenings and vaccines you need and how often you need them. This information is not intended to replace advice given to you by your health care provider. Make sure you discuss any questions you have with your health care provider. Document Released: 09/09/2015 Document Revised: 05/02/2016 Document Reviewed: 06/14/2015 Elsevier Interactive Patient Education  2017 [place] Prevention in the Home Falls can cause injuries. They can happen to people of all ages. There are many things you can do to make your home safe and to help prevent falls. What can I do on the outside of my home?  Regularly fix the edges of walkways and driveways and fix any cracks.  Remove anything that might make you trip as you walk through a door, such as a raised step or threshold.  Trim any bushes or trees on the path to your home.  Use bright outdoor lighting.  Clear any walking paths of anything that might make someone trip, such as rocks or tools.  Regularly check to see if handrails are loose or broken. Make sure that both sides of any steps have handrails.  Any raised decks and porches should have guardrails on the edges.  Have any leaves, snow, or ice cleared regularly.  Use sand or salt on walking paths during winter.  Clean up any spills in your garage right away. This includes oil or grease spills. What can I do in the bathroom?  Use night lights.  Install grab bars by the toilet and in the tub and shower. Do not use towel bars as grab bars.  Use non-skid mats or decals in the tub or shower.  If you need to sit down in the shower, use a plastic, non-slip stool.  Keep the floor dry. Clean up any water that spills on the floor as soon as it happens.  Remove soap buildup in the tub or shower regularly.  Attach bath mats securely with double-sided  non-slip rug tape.  Do not have throw rugs and other things on the floor that can make you trip. What can I do in the bedroom?  Use night lights.  Make sure that you have a light by your bed that is easy to reach.  Do not use any sheets or blankets that are too big for your bed. They should not hang down onto the floor.  Have a firm chair that has side arms. You can use this for support while you get dressed.  Do not have throw rugs and other things on the floor that can make you trip. What can I do in the kitchen?  Clean up any spills right away.  Avoid walking on wet floors.  Keep items that you use a lot in easy-to-reach places.  If you need to reach something above you, use a strong step stool that has a grab bar.  Keep electrical cords out of the way.  Do not use floor polish or wax that makes floors slippery. If you must use wax, use non-skid floor wax.  Do not have throw rugs and other things on the floor that can make you  trip. What can I do with my stairs?  Do not leave any items on the stairs.  Make sure that there are handrails on both sides of the stairs and use them. Fix handrails that are broken or loose. Make sure that handrails are as long as the stairways.  Check any carpeting to make sure that it is firmly attached to the stairs. Fix any carpet that is loose or worn.  Avoid having throw rugs at the top or bottom of the stairs. If you do have throw rugs, attach them to the floor with carpet tape.  Make sure that you have a light switch at the top of the stairs and the bottom of the stairs. If you do not have them, ask someone to add them for you. What else can I do to help prevent falls?  Wear shoes that:  Do not have high heels.  Have rubber bottoms.  Are comfortable and fit you well.  Are closed at the toe. Do not wear sandals.  If you use a stepladder:  Make sure that it is fully opened. Do not climb a closed stepladder.  Make sure that both  sides of the stepladder are locked into place.  Ask someone to hold it for you, if possible.  Clearly mark and make sure that you can see:  Any grab bars or handrails.  First and last steps.  Where the edge of each step is.  Use tools that help you move around (mobility aids) if they are needed. These include:  Canes.  Walkers.  Scooters.  Crutches.  Turn on the lights when you go into a dark area. Replace any light bulbs as soon as they burn out.  Set up your furniture so you have a clear path. Avoid moving your furniture around.  If any of your floors are uneven, fix them.  If there are any pets around you, be aware of where they are.  Review your medicines with your doctor. Some medicines can make you feel dizzy. This can increase your chance of falling. Ask your doctor what other things that you can do to help prevent falls. This information is not intended to replace advice given to you by your health care provider. Make sure you discuss any questions you have with your health care provider. Document Released: 06/09/2009 Document Revised: 01/19/2016 Document Reviewed: 09/17/2014 Elsevier Interactive Patient Education  2017 [employer].

## 2019-02-25 NOTE — Telephone Encounter (Signed)
AWV completed 02/19/19.

## 2019-02-26 ENCOUNTER — Other Ambulatory Visit: Payer: Self-pay | Admitting: Family Medicine

## 2019-03-08 ENCOUNTER — Other Ambulatory Visit: Payer: Self-pay | Admitting: Family Medicine

## 2019-03-18 ENCOUNTER — Ambulatory Visit: Payer: Self-pay | Admitting: Family Medicine

## 2019-04-18 ENCOUNTER — Other Ambulatory Visit: Payer: Self-pay | Admitting: Family Medicine

## 2019-06-03 ENCOUNTER — Encounter: Payer: Self-pay | Admitting: Family Medicine

## 2019-06-03 ENCOUNTER — Ambulatory Visit (INDEPENDENT_AMBULATORY_CARE_PROVIDER_SITE_OTHER): Payer: Medicare Other | Admitting: Family Medicine

## 2019-06-03 ENCOUNTER — Other Ambulatory Visit: Payer: Self-pay

## 2019-06-03 VITALS — BP 164/90 | HR 67 | Temp 96.9°F | Resp 16 | Wt 140.0 lb

## 2019-06-03 DIAGNOSIS — R350 Frequency of micturition: Secondary | ICD-10-CM

## 2019-06-03 DIAGNOSIS — I10 Essential (primary) hypertension: Secondary | ICD-10-CM

## 2019-06-03 DIAGNOSIS — Z636 Dependent relative needing care at home: Secondary | ICD-10-CM

## 2019-06-03 DIAGNOSIS — E7800 Pure hypercholesterolemia, unspecified: Secondary | ICD-10-CM

## 2019-06-03 LAB — POCT URINALYSIS DIPSTICK
Bilirubin, UA: NEGATIVE
Blood, UA: NEGATIVE
Glucose, UA: NEGATIVE
Ketones, UA: NEGATIVE
Leukocytes, UA: NEGATIVE
Nitrite, UA: NEGATIVE
Protein, UA: NEGATIVE
Spec Grav, UA: 1.01 (ref 1.010–1.025)
Urobilinogen, UA: 0.2 E.U./dL
pH, UA: 7 (ref 5.0–8.0)

## 2019-06-03 MED ORDER — ENALAPRIL MALEATE 20 MG PO TABS: 20.0000 mg | ORAL_TABLET | Freq: Every day | ORAL | 11 refills | Status: DC

## 2019-06-03 NOTE — Progress Notes (Signed)
Patient: Lynn Wall Female    DOB: May 11, 1927   85 y.o.   MRN: [id number] Visit Date: 06/03/2019  Today's Provider: [name], MD   Chief Complaint  Patient presents with   Urinary Frequency    x several months   Subjective:     Urinary Frequency  This is a recurrent problem. Episode onset: several months ago. The problem has been unchanged. There has been no fever. Associated symptoms include frequency. Pertinent negatives include no chills, hematuria, nausea or vomiting. Associated symptoms comments: Urinary incontinence. She has tried nothing for the symptoms.  The only other issue is she is getting very frustrated with her husband's dementia. She seems to be doing okay with her arthritic and degenerative disc disease issues. She seems to be coping well with her arthritic and degenerative disc disease issues.  Allergies  Allergen Reactions   Remeron [Mirtazapine]     Dizziness and confusion      Current Outpatient Medications:    aspirin EC 81 MG tablet, Take 81 mg by mouth daily., Disp: , Rfl:    docusate sodium (STOOL SOFTENER) 100 MG capsule, Take 1 capsule (100 mg total) by mouth daily., Disp: 10 capsule, Rfl: 0   enalapril (VASOTEC) 10 MG tablet, Take 10 mg by mouth daily., Disp: , Rfl:    gabapentin (NEURONTIN) 100 MG capsule, TAKE 1 CAPSULE BY MOUTH TWICE A DAY, Disp: 180 capsule, Rfl: 2   linaclotide (LINZESS) 72 MCG capsule, Take 1 capsule (72 mcg total) by mouth daily before breakfast., Disp: 60 capsule, Rfl: 6   metoprolol tartrate (LOPRESSOR) 25 MG tablet, TAKE 1 TABLET TWICE A DAY, Disp: 180 tablet, Rfl: 3   omeprazole (PRILOSEC) 20 MG capsule, Take 1 capsule (20 mg total) by mouth daily., Disp: 90 capsule, Rfl: 3   ondansetron (ZOFRAN) 4 MG tablet, 1/2 to 1 tablet every 6 hours as needed nausea, Disp: 100 tablet, Rfl: 2   polyethylene glycol (MIRALAX / GLYCOLAX) packet, Take 17 g by mouth daily. (Patient taking differently: Take  17 g by mouth daily as needed. ), Disp: 14 each, Rfl: 0   simvastatin (ZOCOR) 40 MG tablet, TAKE 1 TABLET BY MOUTH AT BEDTIME, Disp: 90 tablet, Rfl: 3   traMADol (ULTRAM) 50 MG tablet, TAKE 2 TABLETS (100 MG TOTAL) BY MOUTH EVERY 6 (SIX) HOURS AS NEEDED., Disp: 150 tablet, Rfl: 4   traZODone (DESYREL) 50 MG tablet, TAKE 1 TABLET BY MOUTH EVERYDAY AT BEDTIME, Disp: 90 tablet, Rfl: 2   clidinium-chlordiazePOXIDE (LIBRAX) 5-2.5 MG capsule, Take 1 capsule by mouth 2 (two) times daily as needed. (Patient not taking: Reported on 06/03/2019), Disp: 60 capsule, Rfl: 1   lisinopril (PRINIVIL,ZESTRIL) 10 MG tablet, TAKE 1 TABLET BY MOUTH EVERY DAY (Patient not taking: Reported on 06/03/2019), Disp: 90 tablet, Rfl: 3   meloxicam (MOBIC) 15 MG tablet, Take 1 tablet by mouth daily., Disp: , Rfl:   Review of Systems  Constitutional: Negative for appetite change, chills, fatigue and fever.  HENT: Negative.   Eyes: Negative.   Respiratory: Negative for chest tightness and shortness of breath.   Cardiovascular: Negative for chest pain and palpitations.  Gastrointestinal: Positive for abdominal distention. Negative for abdominal pain, nausea and vomiting.  Endocrine: Negative.   Genitourinary: Positive for frequency. Negative for hematuria.  Musculoskeletal: Positive for back pain.  Allergic/Immunologic: Negative.   Neurological: Negative for dizziness and weakness.  Hematological: Negative.   Psychiatric/Behavioral: Negative.     Social History  Tobacco Use   Smoking status: Never Smoker   Smokeless tobacco: Never Used  Substance Use Topics   Alcohol use: No    Alcohol/week: 0.0 standard drinks      Objective:   BP (!) 164/90 (BP Location: Left Arm, Cuff Size: Normal)    Pulse 67    Temp (!) 96.9 F (36.1 C) (Temporal)    Resp 16    Wt 140 lb (63.5 kg)    LMP  (LMP Unknown)    SpO2 97%    BMI 24.80 kg/m  Vitals:   06/03/19 1555 06/03/19 1557  BP: (!) 162/80 (!) 164/90  Pulse: 67     Resp: 16   Temp: (!) 96.9 F (36.1 C)   TempSrc: Temporal   SpO2: 97%   Weight: 140 lb (63.5 kg)   Body mass index is 24.8 kg/m.   Physical Exam Vitals signs reviewed.  Constitutional:      Appearance: She is well-developed.  HENT:     Head: Normocephalic and atraumatic.  Eyes:     General: No scleral icterus.    Conjunctiva/sclera: Conjunctivae normal.  Cardiovascular:     Rate and Rhythm: Normal rate and regular rhythm.     Heart sounds: Normal heart sounds.  Pulmonary:     Effort: Pulmonary effort is normal.     Breath sounds: Normal breath sounds.  Abdominal:     Palpations: Abdomen is soft.  Skin:    General: Skin is warm and dry.  Neurological:     General: No focal deficit present.     Mental Status: She is oriented to person, place, and time.  Psychiatric:        Mood and Affect: Mood normal.        Behavior: Behavior normal.        Thought Content: Thought content normal.        Judgment: Judgment normal.      No results found for any visits on 06/03/19.     Assessment & Plan       1. Urinary frequency Discussed conservative management of issue. Consider adult diapers. - POCT Urinalysis Dipstick - Urine Culture  2. Benign essential HTN Increase enalapril from 10 to 20 mg daily. - CBC with Differential/Platelet - Comprehensive metabolic panel - enalapril (VASOTEC) 20 MG tablet; Take 1 tablet (20 mg total) by mouth daily.  Dispense: 30 tablet; Refill: 11  3. Caregiver burden Husband with progressive dementia.More than 50% 25 minute visit spent in counseling or coordination of care   4. Hypercholesteremia Stop simvastatin at 85 years old after discussion with patient and daughter. - TSH   Deadrian Toya Cranford Mon, MD  [place]

## 2019-06-03 NOTE — Patient Instructions (Addendum)
Stop Simvastatin.   Try to drink more fluids.

## 2019-06-04 ENCOUNTER — Telehealth: Payer: Self-pay | Admitting: Family Medicine

## 2019-06-04 NOTE — Telephone Encounter (Signed)
Pt's daughter wants a call back regarding labs asas they come in. Wanting to know if pt has a UTI and needing treatment if she has it.  Call:  Odie Sera     [phone] Jerilynn Mages)  Thanks, [facility]

## 2019-06-05 LAB — URINE CULTURE: Organism ID, Bacteria: NO GROWTH

## 2019-06-05 NOTE — Telephone Encounter (Signed)
Ms. Ileene Patrick advised.   Thanks,   -Mickel Baas

## 2019-06-05 NOTE — Telephone Encounter (Signed)
-----   Message from Jerrol Banana., MD sent at 06/05/2019  8:24 AM EDT ----- No UTI.

## 2019-06-08 DIAGNOSIS — I10 Essential (primary) hypertension: Secondary | ICD-10-CM | POA: Diagnosis not present

## 2019-06-08 DIAGNOSIS — E7800 Pure hypercholesterolemia, unspecified: Secondary | ICD-10-CM | POA: Diagnosis not present

## 2019-06-09 LAB — COMPREHENSIVE METABOLIC PANEL
ALT: 10 IU/L (ref 0–32)
AST: 18 IU/L (ref 0–40)
Albumin/Globulin Ratio: 1.7 (ref 1.2–2.2)
Albumin: 3.9 g/dL (ref 3.6–4.6)
Alkaline Phosphatase: 74 IU/L (ref 39–117)
BUN/Creatinine Ratio: 18 (ref 12–28)
BUN: 16 mg/dL (ref 8–27)
Bilirubin Total: 0.2 mg/dL (ref 0.0–1.2)
CO2: 23 mmol/L (ref 20–29)
Calcium: 9.2 mg/dL (ref 8.7–10.3)
Chloride: 105 mmol/L (ref 96–106)
Creatinine, Ser: 0.89 mg/dL (ref 0.57–1.00)
GFR calc Af Amer: 66 mL/min/{1.73_m2} (ref >59)
GFR calc non Af Amer: 58 mL/min/{1.73_m2} — ABNORMAL LOW (ref >59)
Globulin, Total: 2.3 g/dL (ref 1.5–4.5)
Glucose: 119 mg/dL — ABNORMAL HIGH (ref 65–99)
Potassium: 4.3 mmol/L (ref 3.5–5.2)
Sodium: 141 mmol/L (ref 134–144)
Total Protein: 6.2 g/dL (ref 6.0–8.5)

## 2019-06-09 LAB — CBC WITH DIFFERENTIAL/PLATELET
Basophils Absolute: 0 10*3/uL (ref 0.0–0.2)
Basos: 1 %
EOS (ABSOLUTE): 0.1 10*3/uL (ref 0.0–0.4)
Eos: 2 %
Hematocrit: 33.2 % — ABNORMAL LOW (ref 34.0–46.6)
Hemoglobin: 10.9 g/dL — ABNORMAL LOW (ref 11.1–15.9)
Immature Grans (Abs): 0 10*3/uL (ref 0.0–0.1)
Immature Granulocytes: 0 %
Lymphocytes Absolute: 1.7 10*3/uL (ref 0.7–3.1)
Lymphs: 36 %
MCH: 30.4 pg (ref 26.6–33.0)
MCHC: 32.8 g/dL (ref 31.5–35.7)
MCV: 93 fL (ref 79–97)
Monocytes Absolute: 0.4 10*3/uL (ref 0.1–0.9)
Monocytes: 8 %
Neutrophils Absolute: 2.5 10*3/uL (ref 1.4–7.0)
Neutrophils: 53 %
Platelets: 155 10*3/uL (ref 150–450)
RBC: 3.59 x10E6/uL — ABNORMAL LOW (ref 3.77–5.28)
RDW: 12.9 % (ref 11.7–15.4)
WBC: 4.8 10*3/uL (ref 3.4–10.8)

## 2019-06-09 LAB — TSH: TSH: 1.6 u[IU]/mL (ref 0.450–4.500)

## 2019-06-11 ENCOUNTER — Telehealth: Payer: Self-pay

## 2019-06-11 NOTE — Telephone Encounter (Signed)
Daughter notified of lab results.

## 2019-06-11 NOTE — Telephone Encounter (Signed)
-----   Message from Jerrol Banana., MD sent at 06/11/2019  9:11 AM EDT ----- Labs in normal range.

## 2019-07-13 ENCOUNTER — Other Ambulatory Visit: Payer: Self-pay | Admitting: Family Medicine

## 2019-07-21 ENCOUNTER — Other Ambulatory Visit: Payer: Self-pay

## 2019-07-27 ENCOUNTER — Ambulatory Visit: Payer: Self-pay | Admitting: Family Medicine

## 2019-09-03 DIAGNOSIS — C44319 Basal cell carcinoma of skin of other parts of face: Secondary | ICD-10-CM | POA: Diagnosis not present

## 2019-09-03 DIAGNOSIS — L820 Inflamed seborrheic keratosis: Secondary | ICD-10-CM | POA: Diagnosis not present

## 2019-09-03 DIAGNOSIS — D485 Neoplasm of uncertain behavior of skin: Secondary | ICD-10-CM | POA: Diagnosis not present

## 2019-09-17 ENCOUNTER — Other Ambulatory Visit: Payer: Self-pay | Admitting: Family Medicine

## 2019-09-17 DIAGNOSIS — G8929 Other chronic pain: Secondary | ICD-10-CM

## 2019-09-17 DIAGNOSIS — M25511 Pain in right shoulder: Secondary | ICD-10-CM

## 2019-09-17 NOTE — Telephone Encounter (Signed)
Medication Refill - Medication: tramadol and metoprolol tartrate (LOPRESSOR) 25 MG tablet    Has the patient contacted their pharmacy? Yes.   (Agent: If no, request that the patient contact the pharmacy for the refill.) (Agent: If yes, when and what did the pharmacy advise?)  Preferred Pharmacy (with phone number or street name): [facility]    Agent: Please be advised that RX refills may take up to 3 business days. We ask that you follow-up with your pharmacy.

## 2019-10-08 DIAGNOSIS — C44319 Basal cell carcinoma of skin of other parts of face: Secondary | ICD-10-CM | POA: Diagnosis not present

## 2019-10-11 ENCOUNTER — Other Ambulatory Visit: Payer: Self-pay | Admitting: Family Medicine

## 2019-10-20 DIAGNOSIS — H353221 Exudative age-related macular degeneration, left eye, with active choroidal neovascularization: Secondary | ICD-10-CM | POA: Diagnosis not present

## 2019-10-29 ENCOUNTER — Ambulatory Visit (INDEPENDENT_AMBULATORY_CARE_PROVIDER_SITE_OTHER): Payer: Medicare Other | Admitting: Family Medicine

## 2019-10-29 ENCOUNTER — Encounter: Payer: Self-pay | Admitting: Family Medicine

## 2019-10-29 ENCOUNTER — Other Ambulatory Visit: Payer: Self-pay

## 2019-10-29 VITALS — BP 190/97 | HR 60 | Temp 96.9°F | Wt 137.8 lb

## 2019-10-29 DIAGNOSIS — K219 Gastro-esophageal reflux disease without esophagitis: Secondary | ICD-10-CM | POA: Diagnosis not present

## 2019-10-29 DIAGNOSIS — M25511 Pain in right shoulder: Secondary | ICD-10-CM

## 2019-10-29 DIAGNOSIS — R079 Chest pain, unspecified: Secondary | ICD-10-CM

## 2019-10-29 DIAGNOSIS — E7800 Pure hypercholesterolemia, unspecified: Secondary | ICD-10-CM | POA: Diagnosis not present

## 2019-10-29 DIAGNOSIS — I4891 Unspecified atrial fibrillation: Secondary | ICD-10-CM | POA: Diagnosis not present

## 2019-10-29 DIAGNOSIS — I10 Essential (primary) hypertension: Secondary | ICD-10-CM | POA: Diagnosis not present

## 2019-10-29 DIAGNOSIS — I209 Angina pectoris, unspecified: Secondary | ICD-10-CM

## 2019-10-29 DIAGNOSIS — G8929 Other chronic pain: Secondary | ICD-10-CM | POA: Diagnosis not present

## 2019-10-29 DIAGNOSIS — M5416 Radiculopathy, lumbar region: Secondary | ICD-10-CM

## 2019-10-29 DIAGNOSIS — M810 Age-related osteoporosis without current pathological fracture: Secondary | ICD-10-CM

## 2019-10-29 DIAGNOSIS — IMO0002 Reserved for concepts with insufficient information to code with codable children: Secondary | ICD-10-CM

## 2019-10-29 DIAGNOSIS — E119 Type 2 diabetes mellitus without complications: Secondary | ICD-10-CM

## 2019-10-29 DIAGNOSIS — I4892 Unspecified atrial flutter: Secondary | ICD-10-CM | POA: Diagnosis not present

## 2019-10-29 MED ORDER — METOPROLOL TARTRATE 25 MG PO TABS: ORAL_TABLET | ORAL | 3 refills | Status: DC

## 2019-10-29 MED ORDER — ISOSORBIDE MONONITRATE ER 30 MG PO TB24: 30.0000 mg | ORAL_TABLET | Freq: Every day | ORAL | 12 refills | Status: DC

## 2019-10-29 MED ORDER — NITROGLYCERIN 0.4 MG SL SUBL: 0.4000 mg | SUBLINGUAL_TABLET | SUBLINGUAL | 12 refills | Status: AC | PRN

## 2019-10-29 NOTE — Progress Notes (Signed)
Patient: Lynn Wall Female    DOB: 11/03/1926   86 y.o.   MRN: [id number] Visit Date: 10/30/2019  Today's Provider: [name], MD   Chief Complaint  Patient presents with  . Arm Pain  . Leg Pain   Subjective:     Arm Pain  There was no injury mechanism. The pain is present in the right shoulder (left ankle). The quality of the pain is described as aching. The pain does not radiate. The pain is at a severity of 6/10. Pertinent negatives include no chest pain. The symptoms are aggravated by lifting and movement. She has tried acetaminophen for the symptoms. The treatment provided mild relief.  Patient is brought in by her daughter.  She just does not feel well.  She is able to move around a little bit but recently is complaining of chest discomfort which has on occasion moved to the right side of her jaw.  She also complains of right shoulder pain with movement of the shoulder.  Not with ambulation.  With the chest pain she has no diaphoresis syncope presyncope or nausea.  The chest pain is nondescript and she has no belching with it.  She states that usually lasts a few minutes. Another thing discussed is that she lives at home with her husband.  He has significant dementia with behavioral disturbance.  He does not recognize the wife or daughter most of the time now.  He is still very ambulatory and active physically.  They are concerned about his safety and her safety. Of note the patient has had no syncope and no falls. Allergies  Allergen Reactions  . Remeron [Mirtazapine]     Dizziness and confusion      Current Outpatient Medications:  .  aspirin EC 81 MG tablet, Take 81 mg by mouth daily., Disp: , Rfl:  .  docusate sodium (STOOL SOFTENER) 100 MG capsule, Take 1 capsule (100 mg total) by mouth daily., Disp: 10 capsule, Rfl: 0 .  enalapril (VASOTEC) 20 MG tablet, Take 1 tablet (20 mg total) by mouth daily., Disp: 30 tablet, Rfl: 11 .  gabapentin (NEURONTIN)  100 MG capsule, TAKE 1 CAPSULE BY MOUTH TWICE A DAY, Disp: 180 capsule, Rfl: 2 .  metoprolol tartrate (LOPRESSOR) 25 MG tablet, Take 1.5 tablet twice a day, Disp: 180 tablet, Rfl: 3 .  omeprazole (PRILOSEC) 20 MG capsule, Take 1 capsule (20 mg total) by mouth daily., Disp: 90 capsule, Rfl: 3 .  polyethylene glycol (MIRALAX / GLYCOLAX) packet, Take 17 g by mouth daily. (Patient taking differently: Take 17 g by mouth daily as needed. ), Disp: 14 each, Rfl: 0 .  traMADol (ULTRAM) 50 MG tablet, TAKE 2 TABLETS (100 MG TOTAL) BY MOUTH EVERY 6 (SIX) HOURS AS NEEDED., Disp: 150 tablet, Rfl: 4 .  traZODone (DESYREL) 50 MG tablet, TAKE 1 TABLET BY MOUTH EVERYDAY AT BEDTIME, Disp: 90 tablet, Rfl: 2 .  clidinium-chlordiazePOXIDE (LIBRAX) 5-2.5 MG capsule, Take 1 capsule by mouth 2 (two) times daily as needed. (Patient not taking: Reported on 06/03/2019), Disp: 60 capsule, Rfl: 1 .  isosorbide mononitrate (IMDUR) 30 MG 24 hr tablet, Take 1 tablet (30 mg total) by mouth daily., Disp: 30 tablet, Rfl: 12 .  linaclotide (LINZESS) 72 MCG capsule, Take 1 capsule (72 mcg total) by mouth daily before breakfast. (Patient not taking: Reported on 10/29/2019), Disp: 60 capsule, Rfl: 6 .  lisinopril (ZESTRIL) 10 MG tablet, TAKE 1 TABLET BY MOUTH EVERY  DAY (Patient not taking: Reported on 10/29/2019), Disp: 90 tablet, Rfl: 0 .  meloxicam (MOBIC) 15 MG tablet, Take 1 tablet by mouth daily., Disp: , Rfl:  .  nitroGLYCERIN (NITROSTAT) 0.4 MG SL tablet, Place 1 tablet (0.4 mg total) under the tongue every 5 (five) minutes as needed for chest pain., Disp: 30 tablet, Rfl: 12 .  ondansetron (ZOFRAN) 4 MG tablet, 1/2 to 1 tablet every 6 hours as needed nausea (Patient not taking: Reported on 10/29/2019), Disp: 100 tablet, Rfl: 2 .  simvastatin (ZOCOR) 40 MG tablet, TAKE 1 TABLET BY MOUTH AT BEDTIME (Patient not taking: Reported on 10/29/2019), Disp: 90 tablet, Rfl: 0  Review of Systems  Constitutional: Negative for appetite change, chills,  fatigue and fever.  Eyes: Negative.   Respiratory: Negative for chest tightness and shortness of breath.   Cardiovascular: Negative for chest pain and palpitations.  Gastrointestinal: Negative for abdominal pain, nausea and vomiting.  Endocrine: Negative.   Musculoskeletal: Positive for arthralgias, back pain and gait problem.  Allergic/Immunologic: Negative.   Neurological: Negative for dizziness and weakness.  Hematological: Negative.   Psychiatric/Behavioral: Negative.     Social History   Tobacco Use  . Smoking status: Never Smoker  . Smokeless tobacco: Never Used  Substance Use Topics  . Alcohol use: No    Alcohol/week: 0.0 standard drinks      Objective:   BP (!) 190/97 (BP Location: Left Arm, Patient Position: Sitting, Cuff Size: Normal)   Pulse 60   Temp (!) 96.9 F (36.1 C) (Temporal)   Wt 137 lb 12.8 oz (62.5 kg)   LMP  (LMP Unknown)   BMI 24.41 kg/m  Vitals:   10/29/19 1620 10/29/19 1656  BP:  (!) 190/97  Pulse:  60  Temp: (!) 96.9 F (36.1 C)   TempSrc: Temporal   Weight: 137 lb 12.8 oz (62.5 kg)   Body mass index is 24.41 kg/m.   Physical Exam Vitals reviewed.  Constitutional:      Appearance: She is well-developed.     Comments: Thin white female in no acute distress.  HENT:     Head: Normocephalic and atraumatic.     Right Ear: External ear normal.     Left Ear: External ear normal.  Eyes:     General: No scleral icterus.    Conjunctiva/sclera: Conjunctivae normal.  Cardiovascular:     Rate and Rhythm: Normal rate and regular rhythm.     Heart sounds: Normal heart sounds.  Pulmonary:     Effort: Pulmonary effort is normal.     Breath sounds: Normal breath sounds.  Abdominal:     Palpations: Abdomen is soft.  Skin:    General: Skin is warm and dry.  Neurological:     General: No focal deficit present.     Mental Status: She is alert and oriented to person, place, and time.  Psychiatric:        Mood and Affect: Mood normal.         Behavior: Behavior normal.        Thought Content: Thought content normal.        Judgment: Judgment normal.   EKG reveals sinus rhythm with possible old anteroseptal infarct.  Nonspecific ST-T wave changes.   No results found for any visits on 10/29/19.     Assessment & Plan     1. Chest pain, unspecified type Concerning for angina.  Could be reflux and could be referred pain from her shoulder.  At this  time will start Imdur 30 mg daily and give Nitrostat as needed chest pain.  Increase metoprolol to 25 mg 1-1/2 tablets twice daily.  Refer to cardiology and I will see her back in a month.  She is not presently having pain. - EKG 12-Lead - Ambulatory referral to Cardiology  2. Angina pectoris (HCC) Add the Imdur and increase metoprolol by 50%.  Recheck 1 month - nitroGLYCERIN (NITROSTAT) 0.4 MG SL tablet; Place 1 tablet (0.4 mg total) under the tongue every 5 (five) minutes as needed for chest pain.  Dispense: 30 tablet; Refill: 12 - Ambulatory referral to Cardiology  3. Flutter-fibrillation ([place]) History of atrial flutter fib flutter  4. Benign essential HTN More than 50% 45 minute visit spent in counseling or coordination of care 05.r0g  5. Gastroesophageal reflux disease without esophagitis On omeprazole daily.  6. Diabetes mellitus type 2 without retinopathy ([place]) A1c and other labs necessary.  7. Lumbar radiculitis Continues to cause patient pain  8. Age-related osteoporosis without current pathological fracture   9. Hypercholesteremia On simvastatin  10. Chronic right shoulder pain May need orthopedic referral after seeing cardiology.  Am reluctant to give her nonsteroidal.  Reason for Referral: Angina  Referral discussed with patient: yes Best contact number of patient for referral team: [phone] Has patient been seen by a specialist for this issue before: no . If so, who (practice/provider):  [facility] Kitchen Does the patient wish to return: yes Patient provider  preference for referral: Dr. Nehemiah Massed at [facility] Patient location preference for referral:       [name], MD  [facility]

## 2019-10-31 ENCOUNTER — Other Ambulatory Visit: Payer: Self-pay | Admitting: Family Medicine

## 2019-10-31 NOTE — Telephone Encounter (Signed)
Requested medication (s) are due for refill today     Requested medication (s) are on the active medication list   YES  Future visit scheduled  YES.  Note to clinic: This medication not delegated. Routing to provider for consideration.   Requested Prescriptions  Pending Prescriptions Disp Refills   traMADol (ULTRAM) 50 MG tablet [Pharmacy Med Name: TRAMADOL HCL 50 MG TABLET] 150 tablet     Sig: TAKE 2 TABLETS (100 MG TOTAL) BY MOUTH EVERY 6 (SIX) HOURS AS NEEDED.      Not Delegated - Analgesics:  Opioid Agonists Failed - 10/31/2019  2:01 PM      Failed - This refill cannot be delegated      Failed - Urine Drug Screen completed in last 360 days.      Passed - Valid encounter within last 6 months    Recent Outpatient Visits           2 days ago Chest pain, unspecified type   [facility] Jerrol Banana., MD   5 months ago Urinary frequency   [facility] Jerrol Banana., MD   1 year ago Chronic midline low back pain, unspecified whether sciatica present   [facility] Jerrol Banana., MD   1 year ago Diabetes mellitus type 2 without retinopathy [facility])   [facility] Jerrol Banana., MD   1 year ago Chest pain, unspecified type   [facility] Trinna Post, [place]       Future Appointments             In 1 month Jerrol Banana., MD [facility]

## 2019-11-02 NOTE — Telephone Encounter (Signed)
Pts daughter states that the pt will be out of medication today. Please advise.

## 2019-11-09 DIAGNOSIS — I10 Essential (primary) hypertension: Secondary | ICD-10-CM | POA: Diagnosis not present

## 2019-11-09 DIAGNOSIS — I208 Other forms of angina pectoris: Secondary | ICD-10-CM | POA: Diagnosis not present

## 2019-11-09 DIAGNOSIS — I480 Paroxysmal atrial fibrillation: Secondary | ICD-10-CM | POA: Diagnosis not present

## 2019-11-09 DIAGNOSIS — I2089 Other forms of angina pectoris: Secondary | ICD-10-CM | POA: Insufficient documentation

## 2019-11-26 ENCOUNTER — Other Ambulatory Visit: Payer: Self-pay | Admitting: Family Medicine

## 2019-12-08 DIAGNOSIS — H353221 Exudative age-related macular degeneration, left eye, with active choroidal neovascularization: Secondary | ICD-10-CM | POA: Diagnosis not present

## 2019-12-08 LAB — HM DIABETES EYE EXAM

## 2019-12-09 ENCOUNTER — Ambulatory Visit: Payer: Self-pay | Admitting: Family Medicine

## 2019-12-10 DIAGNOSIS — I10 Essential (primary) hypertension: Secondary | ICD-10-CM | POA: Diagnosis not present

## 2019-12-10 DIAGNOSIS — I208 Other forms of angina pectoris: Secondary | ICD-10-CM | POA: Diagnosis not present

## 2019-12-10 DIAGNOSIS — R0789 Other chest pain: Secondary | ICD-10-CM | POA: Diagnosis not present

## 2019-12-11 NOTE — Progress Notes (Signed)
Established patient visit     Patient: Lynn Wall   DOB: 12/22/1926   86 y.o. Female  MRN: [id number] Visit Date: 12/14/2019  I,Sulibeya S Dimas,acting as a scribe for [name], MD.,have documented all relevant documentation on the behalf of [name], MD,as directed by  [name], MD while in the presence of [name], MD.  Today's healthcare provider: [name], MD  Subjective:    Chief Complaint  Patient presents with  . Follow-up  -pt had appt with cardiology on 12/10/19 see notes    Chest pain, unspecified type From 10/29/2019-started Imdur 30 mg daily and give Nitrostat as needed chest pain.  Increased metoprolol to 25 mg 1-1/2 tablets twice daily.  Referred to cardiology and I will see her back in a month.  patient reports chest pain is better. Patient reports "heaviness" in her chest that lasted about 5 seconds. Patient reports Dr. Nehemiah Massed changed Metoprolol 25mg  2 times daily. Enalapril 10mg  BID.  Cardiology work-up was negative for any ischemia or other cardiac abnormalities. She continues to complain of right-sided chest wall pain superior and lateral to her right breast. Depression screen [facility] 2/9 12/14/2019 02/19/2019 02/10/2018 01/15/2018 01/14/2018  Decreased Interest 2 3 0 1 0  Down, Depressed, Hopeless 1 1 1 1 1   PHQ - 2 Score 3 4 1 2 1   Altered sleeping 3 3 - 1 -  Tired, decreased energy 3 3 - 3 -  Change in appetite 2 3 - 3 -  Feeling bad or failure about yourself  0 0 - 0 -  Trouble concentrating 1 1 - 2 -  Moving slowly or fidgety/restless 1 0 - 1 -  Suicidal thoughts 0 0 - 1 -  PHQ-9 Score 13 14 - 13 -  Difficult doing work/chores Very difficult Not difficult at all - Very difficult -    Social History   Tobacco Use  . Smoking status: Never Smoker  . Smokeless tobacco: Never Used  Substance Use Topics  . Alcohol use: No    Alcohol/week: 0.0 standard drinks  . Drug use: No        Medications: Outpatient Medications Prior to Visit  Medication Sig  . aspirin EC 81 MG tablet Take 81 mg by mouth daily.  Marland Kitchen docusate sodium (STOOL SOFTENER) 100 MG capsule Take 1 capsule (100 mg total) by mouth daily.  . enalapril (VASOTEC) 10 MG tablet Take 10 mg by mouth 2 (two) times daily.   Marland Kitchen gabapentin (NEURONTIN) 100 MG capsule TAKE 1 CAPSULE BY MOUTH TWICE A DAY (Patient taking differently: Take 100 mg by mouth 3 (three) times daily. )  . isosorbide mononitrate (IMDUR) 30 MG 24 hr tablet Take 1 tablet (30 mg total) by mouth daily.  . metoprolol tartrate (LOPRESSOR) 25 MG tablet Take 1.5 tablet twice a day (Patient taking differently: Take 25 mg by mouth 2 (two) times daily. )  . nitroGLYCERIN (NITROSTAT) 0.4 MG SL tablet Place 1 tablet (0.4 mg total) under the tongue every 5 (five) minutes as needed for chest pain.  Marland Kitchen omeprazole (PRILOSEC) 20 MG capsule Take 1 capsule (20 mg total) by mouth daily. (Patient taking differently: Take 20 mg by mouth as needed. )  . ondansetron (ZOFRAN) 4 MG tablet 1/2 to 1 tablet every 6 hours as needed nausea  . polyethylene glycol (MIRALAX / GLYCOLAX) packet Take 17 g by mouth daily. (Patient taking differently: Take 17 g by mouth daily as needed. )  .  traMADol (ULTRAM) 50 MG tablet TAKE 2 TABLETS (100 MG TOTAL) BY MOUTH EVERY 6 (SIX) HOURS AS NEEDED.  Marland Kitchen traZODone (DESYREL) 50 MG tablet TAKE 1 TABLET BY MOUTH EVERYDAY AT BEDTIME  . [DISCONTINUED] enalapril (VASOTEC) 20 MG tablet Take 1 tablet (20 mg total) by mouth daily.  . clidinium-chlordiazePOXIDE (LIBRAX) 5-2.5 MG capsule Take 1 capsule by mouth 2 (two) times daily as needed. (Patient not taking: Reported on 06/03/2019)  . lisinopril (ZESTRIL) 10 MG tablet TAKE 1 TABLET BY MOUTH EVERY DAY (Patient not taking: Reported on 10/29/2019)  . [DISCONTINUED] linaclotide (LINZESS) 72 MCG capsule Take 1 capsule (72 mcg total) by mouth daily before breakfast. (Patient not taking: Reported on 10/29/2019)  .  [DISCONTINUED] meloxicam (MOBIC) 15 MG tablet Take 1 tablet by mouth daily.  . [DISCONTINUED] simvastatin (ZOCOR) 40 MG tablet TAKE 1 TABLET BY MOUTH AT BEDTIME (Patient not taking: Reported on 10/29/2019)   No facility-administered medications prior to visit.    Review of Systems  Constitutional: Positive for activity change, appetite change and fatigue. Negative for diaphoresis.  HENT: Negative.   Eyes: Negative.   Respiratory: Negative for chest tightness, shortness of breath and wheezing.   Cardiovascular: Positive for chest pain. Negative for palpitations.  Endocrine: Negative.   Musculoskeletal: Positive for myalgias.  Allergic/Immunologic: Negative.   Hematological: Negative.   Psychiatric/Behavioral: Positive for decreased concentration and sleep disturbance. The patient is not nervous/anxious.       Objective:    BP (!) 143/78 (BP Location: Right Arm, Patient Position: Sitting, Cuff Size: Normal)   Pulse 64   Temp (!) 96.8 F (36 C) (Temporal)   Resp 16   Wt 139 lb 6.4 oz (63.2 kg)   LMP  (LMP Unknown)   SpO2 96%   BMI 24.69 kg/m    Physical Exam Vitals reviewed. Exam conducted with a chaperone present.  Constitutional:      General: She is not in acute distress.    Appearance: She is well-developed.  HENT:     Head: Normocephalic and atraumatic.  Eyes:     General: No scleral icterus.    Conjunctiva/sclera: Conjunctivae normal.  Cardiovascular:     Rate and Rhythm: Normal rate and regular rhythm.     Pulses: Normal pulses.     Heart sounds: Normal heart sounds. No murmur.  Pulmonary:     Effort: Pulmonary effort is normal. No respiratory distress.     Comments: Minimal right upper lateral chest wall tenderness.  No axillary  adenopathy and no mass of the breast in this region. Chest:     Chest wall: Tenderness present.  Skin:    General: Skin is warm and dry.     Findings: No rash.  Neurological:     Mental Status: She is alert and oriented to person,  place, and time. Mental status is at baseline.  Psychiatric:        Behavior: Behavior normal.        Thought Content: Thought content normal.        Judgment: Judgment normal.        Assessment & Plan:    1. Chest pain, unspecified type Use OTC Voltaren Gel.  This seems to be chest wall pain.  Will obtain chest x-ray to rule out any worrisome issue. - DG Chest 2 View; Future Cardiac work-up completely normal.  I do not think she needs imaging other than chest x-ray. 2. DDD (degenerative disc disease), lumbar Persistent.  As needed tramadol.  May  need referral to pain clinic. Continue current medications  3. Chest wall pain Use OTC Voltaren Gel - DG Chest 2 View; Future  4. Depression, unspecified depression type New, situational with husband and failing health with progressive dementia.  Start low-dose sertraline. Start Sertraline 25mg  daily Follow up in 6 weeks.  Return in about 6 weeks (around 01/25/2020) for depression.     I, [name], MD, have reviewed all documentation for this visit. The documentation on 12/15/19 for the exam, diagnosis, procedures, and orders are all accurate and complete.    Zakira Ressel Cranford Mon, MD  [facility] [phone] (phone) [phone] (fax)  [place]

## 2019-12-14 ENCOUNTER — Ambulatory Visit (INDEPENDENT_AMBULATORY_CARE_PROVIDER_SITE_OTHER): Payer: Medicare Other | Admitting: Family Medicine

## 2019-12-14 ENCOUNTER — Encounter: Payer: Self-pay | Admitting: Family Medicine

## 2019-12-14 ENCOUNTER — Ambulatory Visit
Admission: RE | Admit: 2019-12-14 | Discharge: 2019-12-14 | Disposition: A | Discharge status: Home / Self Care | Payer: Medicare Other | Source: Ambulatory Visit | Attending: Family Medicine | Admitting: Family Medicine

## 2019-12-14 ENCOUNTER — Other Ambulatory Visit: Payer: Self-pay

## 2019-12-14 VITALS — BP 143/78 | HR 64 | Temp 96.8°F | Resp 16 | Wt 139.4 lb

## 2019-12-14 DIAGNOSIS — R0789 Other chest pain: Secondary | ICD-10-CM | POA: Insufficient documentation

## 2019-12-14 DIAGNOSIS — M5136 Other intervertebral disc degeneration, lumbar region: Secondary | ICD-10-CM | POA: Diagnosis not present

## 2019-12-14 DIAGNOSIS — F329 Major depressive disorder, single episode, unspecified: Secondary | ICD-10-CM

## 2019-12-14 DIAGNOSIS — F32A Depression, unspecified: Secondary | ICD-10-CM

## 2019-12-14 DIAGNOSIS — R079 Chest pain, unspecified: Secondary | ICD-10-CM | POA: Insufficient documentation

## 2019-12-14 DIAGNOSIS — I209 Angina pectoris, unspecified: Secondary | ICD-10-CM

## 2019-12-14 MED ORDER — SERTRALINE HCL 25 MG PO TABS: 25.0000 mg | ORAL_TABLET | Freq: Every day | ORAL | 3 refills | Status: DC

## 2019-12-14 NOTE — Patient Instructions (Signed)
Please start Voltarien Gel as needed for chest wall pain.

## 2019-12-14 NOTE — Assessment & Plan Note (Signed)
Stable, persistent Continue tramadol 50mg  every 6 hours PRN Continue Gabapentin.

## 2020-01-07 ENCOUNTER — Other Ambulatory Visit: Payer: Self-pay | Admitting: Family Medicine

## 2020-01-07 NOTE — Telephone Encounter (Signed)
Rx ordered #[id number] 3RF- converted to 90 day supply

## 2020-01-12 DIAGNOSIS — H353221 Exudative age-related macular degeneration, left eye, with active choroidal neovascularization: Secondary | ICD-10-CM | POA: Diagnosis not present

## 2020-02-04 ENCOUNTER — Ambulatory Visit: Payer: Self-pay | Admitting: Family Medicine

## 2020-02-16 NOTE — Progress Notes (Signed)
Established patient visit  I,[name],acting as a scribe for [name], MD.,have documented all relevant documentation on the behalf of [name], MD,as directed by  [name], MD while in the presence of [name], MD.   Patient: Lynn Wall   DOB: 11-09-1926   86 y.o. Female  MRN: [id number] Visit Date: 02/17/2020  Today's healthcare provider: [name], MD   Chief Complaint  Patient presents with  . Follow-up  . Depression   Subjective    HPI  Patient is coming for follow-up.  She still feels frustrated caring for her husband who has significant dementia with some behavioral disturbance.  Certainly he is not violent or abusive. She is having a lot of IBS symptoms with crampy abdominal discomfort.  No blood no fevers no weight loss Depression, unspecified depression type From 12/14/2019-New, situational with husband and failing health with progressive dementia.  Started low-dose sertraline. Start Sertraline 25mg  daily. Follow up in 6 weeks.  Patient is doing well on sertraline.     Medications: Outpatient Medications Prior to Visit  Medication Sig  . aspirin EC 81 MG tablet Take 81 mg by mouth daily.  Marland Kitchen docusate sodium (STOOL SOFTENER) 100 MG capsule Take 1 capsule (100 mg total) by mouth daily.  . enalapril (VASOTEC) 10 MG tablet Take 10 mg by mouth 2 (two) times daily.   Marland Kitchen gabapentin (NEURONTIN) 100 MG capsule TAKE 1 CAPSULE BY MOUTH TWICE A DAY (Patient taking differently: Take 100 mg by mouth 3 (three) times daily. )  . isosorbide mononitrate (IMDUR) 30 MG 24 hr tablet Take 1 tablet (30 mg total) by mouth daily.  . metoprolol tartrate (LOPRESSOR) 25 MG tablet Take 1.5 tablet twice a day (Patient taking differently: Take 25 mg by mouth 2 (two) times daily. )  . nitroGLYCERIN (NITROSTAT) 0.4 MG SL tablet Place 1 tablet (0.4 mg total) under the tongue every 5 (five) minutes as needed for chest pain.  Marland Kitchen ondansetron  (ZOFRAN) 4 MG tablet 1/2 to 1 tablet every 6 hours as needed nausea  . polyethylene glycol (MIRALAX / GLYCOLAX) packet Take 17 g by mouth daily. (Patient taking differently: Take 17 g by mouth daily as needed. )  . sertraline (ZOLOFT) 25 MG tablet TAKE 1 TABLET BY MOUTH EVERY DAY  . traMADol (ULTRAM) 50 MG tablet TAKE 2 TABLETS (100 MG TOTAL) BY MOUTH EVERY 6 (SIX) HOURS AS NEEDED.  Marland Kitchen traZODone (DESYREL) 50 MG tablet TAKE 1 TABLET BY MOUTH EVERYDAY AT BEDTIME  . clidinium-chlordiazePOXIDE (LIBRAX) 5-2.5 MG capsule Take 1 capsule by mouth 2 (two) times daily as needed. (Patient not taking: Reported on 06/03/2019)  . lisinopril (ZESTRIL) 10 MG tablet TAKE 1 TABLET BY MOUTH EVERY DAY (Patient not taking: Reported on 10/29/2019)  . omeprazole (PRILOSEC) 20 MG capsule Take 1 capsule (20 mg total) by mouth daily. (Patient not taking: Reported on 02/17/2020)   No facility-administered medications prior to visit.    Review of Systems  Constitutional: Negative for appetite change, chills, fatigue and fever.  Eyes: Negative.   Respiratory: Negative for chest tightness and shortness of breath.   Cardiovascular: Negative for chest pain and palpitations.  Gastrointestinal: Negative for abdominal pain, nausea and vomiting.       Crampy abdominal discomfort  Endocrine: Negative.   Musculoskeletal: Positive for arthralgias and back pain.  Allergic/Immunologic: Negative.   Neurological: Negative for dizziness and weakness.  Hematological: Negative.   Psychiatric/Behavioral: Positive for dysphoric mood.  The patient is nervous/anxious.       Objective    BP 108/65 (BP Location: Right Arm, Patient Position: Sitting, Cuff Size: Large)   Pulse 62   Temp 97.7 F (36.5 C) (Other (Comment))   Resp 18   Ht 5\' 3"  (1.6 m)   Wt 138 lb (62.6 kg)   LMP  (LMP Unknown)   SpO2 94%   BMI 24.45 kg/m    Physical Exam Vitals reviewed. Exam conducted with a chaperone present.  Constitutional:      General: She is  not in acute distress.    Appearance: She is well-developed.  HENT:     Head: Normocephalic and atraumatic.  Eyes:     General: No scleral icterus.    Conjunctiva/sclera: Conjunctivae normal.  Cardiovascular:     Rate and Rhythm: Normal rate and regular rhythm.     Pulses: Normal pulses.     Heart sounds: Normal heart sounds. No murmur heard.   Pulmonary:     Effort: Pulmonary effort is normal. No respiratory distress.     Breath sounds: Normal breath sounds.  Abdominal:     Palpations: Abdomen is soft.     Tenderness: There is no abdominal tenderness.  Musculoskeletal:     Right lower leg: No edema.     Left lower leg: No edema.  Skin:    General: Skin is warm and dry.     Findings: No rash.  Neurological:     Mental Status: She is alert and oriented to person, place, and time. Mental status is at baseline.  Psychiatric:        Mood and Affect: Mood normal.        Behavior: Behavior normal.        Thought Content: Thought content normal.        Judgment: Judgment normal.       No results found for any visits on 02/17/20.  Assessment & Plan     1. Benign essential HTN  - CBC w/Diff/Platelet - Comprehensive Metabolic Panel (CMET) - TSH - Hemoglobin A1C  2. Hypercholesteremia  - CBC w/Diff/Platelet - Comprehensive Metabolic Panel (CMET) - TSH - Hemoglobin A1C  3. Diabetes mellitus type 2 without retinopathy ([place])  - CBC w/Diff/Platelet - Comprehensive Metabolic Panel (CMET) - TSH - Hemoglobin A1C  4. Irritable bowel syndrome, unspecified type Carefully try Bentyl daily to twice daily. - dicyclomine (BENTYL) 20 MG tablet; Take 1 tablet (20 mg total) by mouth 2 (two) times daily as needed for spasms.  Dispense: 60 tablet; Refill: 11 5.  Lumbar radiculopathy and arthralgia  6.  Recurrent depression I think this is likely a reactive depression to health concerns for herself and her husband.  Consider increasing sertraline to 50 mg.  Return in about 4  months (around 06/18/2020).         Dimitris Shanahan Cranford Mon, MD  [facility] [phone] (phone) [phone] (fax)  [place]

## 2020-02-17 ENCOUNTER — Ambulatory Visit (INDEPENDENT_AMBULATORY_CARE_PROVIDER_SITE_OTHER): Payer: Medicare Other | Admitting: Family Medicine

## 2020-02-17 ENCOUNTER — Other Ambulatory Visit: Payer: Self-pay

## 2020-02-17 ENCOUNTER — Encounter: Payer: Self-pay | Admitting: Family Medicine

## 2020-02-17 VITALS — BP 108/65 | HR 62 | Temp 97.7°F | Resp 18 | Ht 63.0 in | Wt 138.0 lb

## 2020-02-17 DIAGNOSIS — K589 Irritable bowel syndrome without diarrhea: Secondary | ICD-10-CM | POA: Diagnosis not present

## 2020-02-17 DIAGNOSIS — I209 Angina pectoris, unspecified: Secondary | ICD-10-CM | POA: Diagnosis not present

## 2020-02-17 DIAGNOSIS — M5416 Radiculopathy, lumbar region: Secondary | ICD-10-CM

## 2020-02-17 DIAGNOSIS — I10 Essential (primary) hypertension: Secondary | ICD-10-CM | POA: Diagnosis not present

## 2020-02-17 DIAGNOSIS — E7800 Pure hypercholesterolemia, unspecified: Secondary | ICD-10-CM

## 2020-02-17 DIAGNOSIS — E119 Type 2 diabetes mellitus without complications: Secondary | ICD-10-CM | POA: Diagnosis not present

## 2020-02-17 DIAGNOSIS — F339 Major depressive disorder, recurrent, unspecified: Secondary | ICD-10-CM

## 2020-02-17 MED ORDER — DICYCLOMINE HCL 20 MG PO TABS: 20.0000 mg | ORAL_TABLET | Freq: Two times a day (BID) | ORAL | 11 refills | Status: DC | PRN

## 2020-02-18 LAB — COMPREHENSIVE METABOLIC PANEL
ALT: 10 IU/L (ref 0–32)
AST: 20 IU/L (ref 0–40)
Albumin/Globulin Ratio: 2.3 — ABNORMAL HIGH (ref 1.2–2.2)
Albumin: 4.2 g/dL (ref 3.5–4.6)
Alkaline Phosphatase: 63 IU/L (ref 48–121)
BUN/Creatinine Ratio: 22 (ref 12–28)
BUN: 20 mg/dL (ref 10–36)
Bilirubin Total: 0.3 mg/dL (ref 0.0–1.2)
CO2: 22 mmol/L (ref 20–29)
Calcium: 9.2 mg/dL (ref 8.7–10.3)
Chloride: 101 mmol/L (ref 96–106)
Creatinine, Ser: 0.9 mg/dL (ref 0.57–1.00)
GFR calc Af Amer: 65 mL/min/{1.73_m2} (ref >59)
GFR calc non Af Amer: 56 mL/min/{1.73_m2} — ABNORMAL LOW (ref >59)
Globulin, Total: 1.8 g/dL (ref 1.5–4.5)
Glucose: 125 mg/dL — ABNORMAL HIGH (ref 65–99)
Potassium: 4.3 mmol/L (ref 3.5–5.2)
Sodium: 136 mmol/L (ref 134–144)
Total Protein: 6 g/dL (ref 6.0–8.5)

## 2020-02-18 LAB — CBC WITH DIFFERENTIAL/PLATELET
Basophils Absolute: 0 10*3/uL (ref 0.0–0.2)
Basos: 1 %
EOS (ABSOLUTE): 0.2 10*3/uL (ref 0.0–0.4)
Eos: 3 %
Hematocrit: 32.6 % — ABNORMAL LOW (ref 34.0–46.6)
Hemoglobin: 10.6 g/dL — ABNORMAL LOW (ref 11.1–15.9)
Immature Grans (Abs): 0 10*3/uL (ref 0.0–0.1)
Immature Granulocytes: 0 %
Lymphocytes Absolute: 1.5 10*3/uL (ref 0.7–3.1)
Lymphs: 31 %
MCH: 29.1 pg (ref 26.6–33.0)
MCHC: 32.5 g/dL (ref 31.5–35.7)
MCV: 90 fL (ref 79–97)
Monocytes Absolute: 0.4 10*3/uL (ref 0.1–0.9)
Monocytes: 9 %
Neutrophils Absolute: 2.7 10*3/uL (ref 1.4–7.0)
Neutrophils: 56 %
Platelets: 148 10*3/uL — ABNORMAL LOW (ref 150–450)
RBC: 3.64 x10E6/uL — ABNORMAL LOW (ref 3.77–5.28)
RDW: 12.9 % (ref 11.7–15.4)
WBC: 4.8 10*3/uL (ref 3.4–10.8)

## 2020-02-18 LAB — HEMOGLOBIN A1C
Est. average glucose Bld gHb Est-mCnc: 151 mg/dL
Hgb A1c MFr Bld: 6.9 % — ABNORMAL HIGH (ref 4.8–5.6)

## 2020-02-18 LAB — TSH: TSH: 0.99 u[IU]/mL (ref 0.450–4.500)

## 2020-02-19 ENCOUNTER — Telehealth: Payer: Self-pay

## 2020-02-19 NOTE — Telephone Encounter (Signed)
-----   Message from Jerrol Banana., MD sent at 02/19/2020 10:21 AM EDT ----- Labs stable.

## 2020-02-19 NOTE — Telephone Encounter (Signed)
Patient's daughter advised of lab results.  °

## 2020-03-14 NOTE — Progress Notes (Signed)
Subjective:   Lynn Wall is a 86 y.o. female who presents for Medicare Annual (Subsequent) preventive examination.  I connected with Odie Sera (daughter) today by telephone and verified that I am speaking with the correct person using two identifiers. Location patient: home Location provider: work Persons participating in the virtual visit: patient, provider.   I discussed the limitations, risks, security and privacy concerns of performing an evaluation and management service by telephone and the availability of in person appointments. I also discussed with the patient that there may be a patient responsible charge related to this service. The patient expressed understanding and verbally consented to this telephonic visit.    Interactive audio and video telecommunications were attempted between this provider and patient, however failed, due to patient having technical difficulties OR patient did not have access to video capability.  We continued and completed visit with audio only.  Review of Systems    N/A  Cardiac Risk Factors include: advanced age (>99men, >47 women);diabetes mellitus;dyslipidemia;hypertension     Objective:    Today's Vitals   03/15/20 1403  PainSc: 3    There is no height or weight on file to calculate BMI.  Advanced Directives 03/15/2020 02/19/2019 02/10/2018 01/10/2017 03/07/2015  Does Patient Have a Medical Advance Directive? Yes Yes Yes No;Yes Yes  Type of Paramedic of [place];Living will [place];Living will [place];Living will [place];Living will [place] in Chart? Yes - validated most recent copy scanned in chart (See row information) Yes - validated most recent copy scanned in chart (See row information) Yes No - copy requested Yes    Current Medications (verified) Outpatient Encounter Medications as  of 03/15/2020  Medication Sig  . aspirin EC 81 MG tablet Take 81 mg by mouth daily.  Marland Kitchen dicyclomine (BENTYL) 20 MG tablet Take 1 tablet (20 mg total) by mouth 2 (two) times daily as needed for spasms.  Marland Kitchen docusate sodium (STOOL SOFTENER) 100 MG capsule Take 1 capsule (100 mg total) by mouth daily.  . enalapril (VASOTEC) 10 MG tablet Take 10 mg by mouth 2 (two) times daily.   Marland Kitchen gabapentin (NEURONTIN) 100 MG capsule TAKE 1 CAPSULE BY MOUTH TWICE A DAY (Patient taking differently: Take 100 mg by mouth 3 (three) times daily. )  . isosorbide mononitrate (IMDUR) 30 MG 24 hr tablet Take 1 tablet (30 mg total) by mouth daily.  . metoprolol tartrate (LOPRESSOR) 25 MG tablet Take 1.5 tablet twice a day (Patient taking differently: Take 25 mg by mouth 2 (two) times daily. )  . Multiple Vitamins-Minerals (PRESERVISION AREDS 2 PO) Take 1 capsule by mouth daily.  . nitroGLYCERIN (NITROSTAT) 0.4 MG SL tablet Place 1 tablet (0.4 mg total) under the tongue every 5 (five) minutes as needed for chest pain.  Marland Kitchen omeprazole (PRILOSEC) 20 MG capsule Take 1 capsule (20 mg total) by mouth daily. (Patient taking differently: Take 20 mg by mouth daily. As needed)  . ondansetron (ZOFRAN) 4 MG tablet 1/2 to 1 tablet every 6 hours as needed nausea  . polyethylene glycol (MIRALAX / GLYCOLAX) packet Take 17 g by mouth daily. (Patient taking differently: Take 17 g by mouth daily as needed. )  . sertraline (ZOLOFT) 25 MG tablet TAKE 1 TABLET BY MOUTH EVERY DAY  . simvastatin (ZOCOR) 40 MG tablet Take 40 mg by mouth at bedtime.  . traMADol (ULTRAM) 50 MG tablet TAKE  2 TABLETS (100 MG TOTAL) BY MOUTH EVERY 6 (SIX) HOURS AS NEEDED.  Marland Kitchen traZODone (DESYREL) 50 MG tablet TAKE 1 TABLET BY MOUTH EVERYDAY AT BEDTIME  . clidinium-chlordiazePOXIDE (LIBRAX) 5-2.5 MG capsule Take 1 capsule by mouth 2 (two) times daily as needed. (Patient not taking: Reported on 03/15/2020)  . lisinopril (ZESTRIL) 10 MG tablet TAKE 1 TABLET BY MOUTH EVERY DAY  (Patient not taking: Reported on 10/29/2019)   No facility-administered encounter medications on file as of 03/15/2020.    Allergies (verified) Remeron [mirtazapine]   History: Past Medical History:  Diagnosis Date  . Acid reflux   . Allergy   . Cystitis   . DDD (degenerative disc disease), lumbar   . Diabetes mellitus without complication ([place])   . GERD (gastroesophageal reflux disease)   . History of kidney stones   . History of renal stone   . Hyperlipidemia   . Hypertension   . Osteoporosis   . Urinary incontinence   . UTI (urinary tract infection)    currently on antibiotic   Past Surgical History:  Procedure Laterality Date  . ABDOMINAL HYSTERECTOMY    . CATARACT EXTRACTION, BILATERAL    . HEMORRHOID SURGERY    . LUMBAR LAMINECTOMY/DECOMPRESSION MICRODISCECTOMY Left 03/14/2015   Procedure: LUMBAR LAMINECTOMY/DECOMPRESSION MICRODISCECTOMY 1 LEVEL;  Surgeon: Newman Pies, MD;  Location: [facility];  Service: Neurosurgery;  Laterality: Left;  Left L34 microdiskectomy   Family History  Problem Relation Age of Onset  . Cancer Father   . Kidney disease Neg Hx   . Prostate cancer Neg Hx   . Bladder Cancer Neg Hx    Social History   Socioeconomic History  . Marital status: Married    Spouse name: Not on file  . Number of children: 3  . Years of education: Not on file  . Highest education level: 8th grade  Occupational History  . Occupation: retired  Tobacco Use  . Smoking status: Never Smoker  . Smokeless tobacco: Never Used  Vaping Use  . Vaping Use: Never used  Substance and Sexual Activity  . Alcohol use: No    Alcohol/week: 0.0 standard drinks  . Drug use: No  . Sexual activity: Not Currently  Other Topics Concern  . Not on file  Social History Narrative  . Not on file   Social Determinants of Health   Financial Resource Strain: Low Risk   . Difficulty of Paying Living Expenses: Not hard at all  Food Insecurity: No Food Insecurity  . Worried  About Charity fundraiser in the Last Year: Never true  . Ran Out of Food in the Last Year: Never true  Transportation Needs: No Transportation Needs  . Lack of Transportation (Medical): No  . Lack of Transportation (Non-Medical): No  Physical Activity: Inactive  . Days of Exercise per Week: 0 days  . Minutes of Exercise per Session: 0 min  Stress: Stress Concern Present  . Feeling of Stress : Rather much  Social Connections: Moderately Integrated  . Frequency of Communication with Friends and Family: More than three times a week  . Frequency of Social Gatherings with Friends and Family: More than three times a week  . Attends Religious Services: Never  . Active Member of Clubs or Organizations: Yes  . Attends Archivist Meetings: More than 4 times per year  . Marital Status: Married    Tobacco Counseling Counseling given: Not Answered   Clinical Intake:  Pre-visit preparation completed: Yes  Pain :  0-10 Pain Score: 3  Pain Type: Chronic pain Pain Location: Back Pain Orientation: Lower Pain Descriptors / Indicators: Aching, Radiating Pain Frequency: Constant Pain Relieving Factors: Takes Gabapentin for pain.  Pain Relieving Factors: Takes Gabapentin for pain.  Nutritional Risks: None Diabetes: Yes  How often do you need to have someone help you when you read instructions, pamphlets, or other written materials from your doctor or pharmacy?: 1 - Never  Diabetic? Yes  Nutrition Risk Assessment:  Has the patient had any N/V/D within the last 2 months?  No  Does the patient have any non-healing wounds?  No  Has the patient had any unintentional weight loss or weight gain?  No   Diabetes:  Is the patient diabetic?  Yes  If diabetic, was a CBG obtained today?  No  Did the patient bring in their glucometer from home?  No  How often do you monitor your CBG's? Does she not check.   Financial Strains and Diabetes Management:  Are you having any financial  strains with the device, your supplies or your medication? No .  Does the patient want to be seen by Chronic Care Management for management of their diabetes?  No  Would the patient like to be referred to a Nutritionist or for Diabetic Management?  No   Diabetic Exams:  Diabetic Eye Exam: Completed 12/08/19. Diabetic Foot Exam: Overdue, Pt has been advised about the importance in completing this exam. Note made to follow up on this at next in office apt.    Interpreter Needed?: No  Information entered by :: [facility], LPN   Activities of Daily Living In your present state of health, do you have any difficulty performing the following activities: 03/15/2020  Hearing? N  Vision? Y  Comment Has MD.  Difficulty concentrating or making decisions? N  Walking or climbing stairs? Y  Comment Due to back pains.  Dressing or bathing? N  Doing errands, shopping? N  Preparing Food and eating ? N  Using the Toilet? N  In the past six months, have you accidently leaked urine? Y  Comment Wears pads and depends as needed..  Do you have problems with loss of bowel control? N  Managing your Medications? N  Managing your Finances? Y  Comment Daughter manages finances.  Housekeeping or managing your Housekeeping? N  Some recent data might be hidden    Patient Care Team: Jerrol Banana., MD as PCP - General (Family Medicine) Vin-Parikh, Deirdre Peer, MD as Referring Physician (Ophthalmology) Corey Skains, MD as Consulting Physician (Cardiology)  Indicate any recent Medical Services you may have received from other than [facility] providers in the past year (date may be approximate).     Assessment:   This is a routine wellness examination for Pauletta.  Hearing/Vision screen No exam data present  Dietary issues and exercise activities discussed: Current Exercise Habits: The patient does not participate in regular exercise at present, Exercise limited by: orthopedic  condition(s)  Goals    . DIET - INCREASE WATER INTAKE     Recommend increasing water intake to 6 glasses a day.      Depression Screen PHQ 2/9 Scores 03/15/2020 12/14/2019 02/19/2019 02/10/2018 01/15/2018 01/14/2018 01/10/2017  PHQ - 2 Score 3 3 4 1 2 1  0  PHQ- 9 Score 18 13 14  - 13 - 7    Fall Risk Fall Risk  03/15/2020 03/15/2020 07/21/2019 02/10/2018 01/14/2018  Falls in the past year? 1 1 0 Yes No  Comment - -  Emmi Telephone Survey: data to providers prior to load - -  Number falls in past yr: 0 0 - 1 -  Injury with Fall? 0 0 - No -  Risk for fall due to : Impaired vision Impaired vision - - -  Follow up Falls prevention discussed Falls prevention discussed - Falls prevention discussed -    Any stairs in or around the home? Yes  If so, are there any without handrails? No  Home free of loose throw rugs in walkways, pet beds, electrical cords, etc? Yes  Adequate lighting in your home to reduce risk of falls? Yes   ASSISTIVE DEVICES UTILIZED TO PREVENT FALLS:  Life alert? No  Use of a cane, walker or w/c? Yes  Grab bars in the bathroom? Yes  Shower chair or bench in shower? Yes  Elevated toilet seat or a handicapped toilet? Yes    Cognitive Function:     6CIT Screen 03/15/2020 01/10/2017  What Year? 0 points 0 points  What month? 0 points 0 points  What time? 0 points 0 points  Count back from 20 0 points 0 points  Months in reverse 4 points 0 points  Repeat phrase 10 points 0 points  Total Score 14 0    Immunizations Immunization History  Administered Date(s) Administered  . Influenza, High Dose Seasonal PF 05/23/2015, 08/06/2016, 07/23/2017, 07/01/2018  . Moderna SARS-COVID-2 Vaccination 11/04/2019, 12/02/2019  . Pneumococcal Conjugate-13 01/10/2017  . Pneumococcal Polysaccharide-23 02/10/2018  . Tdap 03/27/2012    TDAP status: Up to date Flu Vaccine status: Due fall 2021 Pneumococcal vaccine status: Up to date Covid-19 vaccine status: Completed  vaccines  Qualifies for Shingles Vaccine? Yes   Zostavax completed No   Shingrix Completed?: No.    Education has been provided regarding the importance of this vaccine. Patient has been advised to call insurance company to determine out of pocket expense if they have not yet received this vaccine. Advised may also receive vaccine at local pharmacy or Health Dept. Verbalized acceptance and understanding.  Screening Tests Health Maintenance  Topic Date Due  . FOOT EXAM  01/10/2018  . DEXA SCAN  11/08/2019  . INFLUENZA VACCINE  03/27/2020  . HEMOGLOBIN A1C  08/18/2020  . OPHTHALMOLOGY EXAM  12/07/2020  . TETANUS/TDAP  03/27/2022  . COVID-19 Vaccine  Completed  . PNA vac Low Risk Adult  Completed    Health Maintenance  Health Maintenance Due  Topic Date Due  . FOOT EXAM  01/10/2018  . DEXA SCAN  11/08/2019    Colorectal cancer screening: No longer required.  Mammogram status: No longer required.  Bone Density status: Ordered today. Pt provided with contact info and advised to call to schedule appt.  Lung Cancer Screening: (Low Dose CT Chest recommended if Age 58-80 years, 30 pack-year currently smoking OR have quit w/in 15years.) does not qualify.   Additional Screening:  Vision Screening: Recommended annual ophthalmology exams for early detection of glaucoma and other disorders of the eye. Is the patient up to date with their annual eye exam?  Yes  Who is the provider or what is the name of the office in which the patient attends annual eye exams? Dr Cephus Shelling at [facility] If pt is not established with a provider, would they like to be referred to a provider to establish care? No .   Dental Screening: Recommended annual dental exams for proper oral hygiene  Community Resource Referral / Chronic Care Management: CRR required this visit?  No  CCM required this visit?  No      Plan:     I have personally reviewed and noted the following in the patient's chart:   . Medical and  social history . Use of alcohol, tobacco or illicit drugs  . Current medications and supplements . Functional ability and status . Nutritional status . Physical activity . Advanced directives . List of other physicians . Hospitalizations, surgeries, and ER visits in previous 12 months . Vitals . Screenings to include cognitive, depression, and falls . Referrals and appointments  In addition, I have reviewed and discussed with patient certain preventive protocols, quality metrics, and best practice recommendations. A written personalized care plan for preventive services as well as general preventive health recommendations were provided to patient.     Illa Enlow [place], [place]   1/56/1537   Nurse Notes: Pt needs a diabetic foot exam at next in office apt.

## 2020-03-15 ENCOUNTER — Other Ambulatory Visit: Payer: Self-pay

## 2020-03-15 ENCOUNTER — Ambulatory Visit (INDEPENDENT_AMBULATORY_CARE_PROVIDER_SITE_OTHER): Payer: Medicare Other

## 2020-03-15 DIAGNOSIS — M810 Age-related osteoporosis without current pathological fracture: Secondary | ICD-10-CM | POA: Diagnosis not present

## 2020-03-15 DIAGNOSIS — Z0000 Encounter for general adult medical examination without abnormal findings: Secondary | ICD-10-CM | POA: Diagnosis not present

## 2020-03-15 NOTE — Patient Instructions (Signed)
Lynn Wall , Thank you for taking time to come for your Medicare Wellness Visit. I appreciate your ongoing commitment to your health goals. Please review the following plan we discussed and let me know if I can assist you in the future.   Screening recommendations/referrals: Colonoscopy: No longer required.  Mammogram: No longer required.  Bone Density: Ordered today. Pt provided with contact info and advised to call to schedule appt. Recommended yearly ophthalmology/optometry visit for glaucoma screening and checkup Recommended yearly dental visit for hygiene and checkup  Vaccinations: Influenza vaccine: Due fall 2021 Pneumococcal vaccine: Completed series Tdap vaccine: Up to date, due 03/2022 Shingles vaccine: Currently due, declined today.     Advanced directives: Currently on file.   Conditions/risks identified: Recommend to increase water intake to 6-8 8 oz glasses a day.   Next appointment: 06/21/20 @ 3:00 PM with Dr Rosanna Randy. Declined scheduling an AWV for 2022 at this time.    Preventive Care 37 Years and Older, Female Preventive care refers to lifestyle choices and visits with your health care provider that can promote health and wellness. What does preventive care include?  A yearly physical exam. This is also called an annual well check.  Dental exams once or twice a year.  Routine eye exams. Ask your health care provider how often you should have your eyes checked.  Personal lifestyle choices, including:  Daily care of your teeth and gums.  Regular physical activity.  Eating a healthy diet.  Avoiding tobacco and drug use.  Limiting alcohol use.  Practicing safe sex.  Taking low-dose aspirin every day.  Taking vitamin and mineral supplements as recommended by your health care provider. What happens during an annual well check? The services and screenings done by your health care provider during your annual well check will depend on your age, overall  health, lifestyle risk factors, and family history of disease. Counseling  Your health care provider may ask you questions about your:  Alcohol use.  Tobacco use.  Drug use.  Emotional well-being.  Home and relationship well-being.  Sexual activity.  Eating habits.  History of falls.  Memory and ability to understand (cognition).  Work and work Statistician.  Reproductive health. Screening  You may have the following tests or measurements:  Height, weight, and BMI.  Blood pressure.  Lipid and cholesterol levels. These may be checked every 5 years, or more frequently if you are over 10 years old.  Skin check.  Lung cancer screening. You may have this screening every year starting at age 42 if you have a 30-pack-year history of smoking and currently smoke or have quit within the past 15 years.  Fecal occult blood test (FOBT) of the stool. You may have this test every year starting at age 54.  Flexible sigmoidoscopy or colonoscopy. You may have a sigmoidoscopy every 5 years or a colonoscopy every 10 years starting at age 31.  Hepatitis C blood test.  Hepatitis B blood test.  Sexually transmitted disease (STD) testing.  Diabetes screening. This is done by checking your blood sugar (glucose) after you have not eaten for a while (fasting). You may have this done every 1-3 years.  Bone density scan. This is done to screen for osteoporosis. You may have this done starting at age 61.  Mammogram. This may be done every 1-2 years. Talk to your health care provider about how often you should have regular mammograms. Talk with your health care provider about your test results, treatment options, and if  necessary, the need for more tests. Vaccines  Your health care provider may recommend certain vaccines, such as:  Influenza vaccine. This is recommended every year.  Tetanus, diphtheria, and acellular pertussis (Tdap, Td) vaccine. You may need a Td booster every 10  years.  Zoster vaccine. You may need this after age 20.  Pneumococcal 13-valent conjugate (PCV13) vaccine. One dose is recommended after age 62.  Pneumococcal polysaccharide (PPSV23) vaccine. One dose is recommended after age 70. Talk to your health care provider about which screenings and vaccines you need and how often you need them. This information is not intended to replace advice given to you by your health care provider. Make sure you discuss any questions you have with your health care provider. Document Released: 09/09/2015 Document Revised: 05/02/2016 Document Reviewed: 06/14/2015 Elsevier Interactive Patient Education  2017 [place] Prevention in the Home Falls can cause injuries. They can happen to people of all ages. There are many things you can do to make your home safe and to help prevent falls. What can I do on the outside of my home?  Regularly fix the edges of walkways and driveways and fix any cracks.  Remove anything that might make you trip as you walk through a door, such as a raised step or threshold.  Trim any bushes or trees on the path to your home.  Use bright outdoor lighting.  Clear any walking paths of anything that might make someone trip, such as rocks or tools.  Regularly check to see if handrails are loose or broken. Make sure that both sides of any steps have handrails.  Any raised decks and porches should have guardrails on the edges.  Have any leaves, snow, or ice cleared regularly.  Use sand or salt on walking paths during winter.  Clean up any spills in your garage right away. This includes oil or grease spills. What can I do in the bathroom?  Use night lights.  Install grab bars by the toilet and in the tub and shower. Do not use towel bars as grab bars.  Use non-skid mats or decals in the tub or shower.  If you need to sit down in the shower, use a plastic, non-slip stool.  Keep the floor dry. Clean up any water that  spills on the floor as soon as it happens.  Remove soap buildup in the tub or shower regularly.  Attach bath mats securely with double-sided non-slip rug tape.  Do not have throw rugs and other things on the floor that can make you trip. What can I do in the bedroom?  Use night lights.  Make sure that you have a light by your bed that is easy to reach.  Do not use any sheets or blankets that are too big for your bed. They should not hang down onto the floor.  Have a firm chair that has side arms. You can use this for support while you get dressed.  Do not have throw rugs and other things on the floor that can make you trip. What can I do in the kitchen?  Clean up any spills right away.  Avoid walking on wet floors.  Keep items that you use a lot in easy-to-reach places.  If you need to reach something above you, use a strong step stool that has a grab bar.  Keep electrical cords out of the way.  Do not use floor polish or wax that makes floors slippery. If you must  use wax, use non-skid floor wax.  Do not have throw rugs and other things on the floor that can make you trip. What can I do with my stairs?  Do not leave any items on the stairs.  Make sure that there are handrails on both sides of the stairs and use them. Fix handrails that are broken or loose. Make sure that handrails are as long as the stairways.  Check any carpeting to make sure that it is firmly attached to the stairs. Fix any carpet that is loose or worn.  Avoid having throw rugs at the top or bottom of the stairs. If you do have throw rugs, attach them to the floor with carpet tape.  Make sure that you have a light switch at the top of the stairs and the bottom of the stairs. If you do not have them, ask someone to add them for you. What else can I do to help prevent falls?  Wear shoes that:  Do not have high heels.  Have rubber bottoms.  Are comfortable and fit you well.  Are closed at the  toe. Do not wear sandals.  If you use a stepladder:  Make sure that it is fully opened. Do not climb a closed stepladder.  Make sure that both sides of the stepladder are locked into place.  Ask someone to hold it for you, if possible.  Clearly mark and make sure that you can see:  Any grab bars or handrails.  First and last steps.  Where the edge of each step is.  Use tools that help you move around (mobility aids) if they are needed. These include:  Canes.  Walkers.  Scooters.  Crutches.  Turn on the lights when you go into a dark area. Replace any light bulbs as soon as they burn out.  Set up your furniture so you have a clear path. Avoid moving your furniture around.  If any of your floors are uneven, fix them.  If there are any pets around you, be aware of where they are.  Review your medicines with your doctor. Some medicines can make you feel dizzy. This can increase your chance of falling. Ask your doctor what other things that you can do to help prevent falls. This information is not intended to replace advice given to you by your health care provider. Make sure you discuss any questions you have with your health care provider. Document Released: 06/09/2009 Document Revised: 01/19/2016 Document Reviewed: 09/17/2014 Elsevier Interactive Patient Education  2017 [employer].

## 2020-03-30 ENCOUNTER — Other Ambulatory Visit: Payer: Self-pay | Admitting: Family Medicine

## 2020-04-05 DIAGNOSIS — H353221 Exudative age-related macular degeneration, left eye, with active choroidal neovascularization: Secondary | ICD-10-CM | POA: Diagnosis not present

## 2020-04-21 ENCOUNTER — Other Ambulatory Visit: Payer: Medicare Other

## 2020-05-05 ENCOUNTER — Telehealth: Payer: Self-pay | Admitting: Family Medicine

## 2020-05-05 NOTE — Telephone Encounter (Signed)
Pts daughter stated the medication prescribed for her moms bowel issue is not working and they would like an Rx for Linzess 20 MG/ please advise

## 2020-05-06 ENCOUNTER — Telehealth: Payer: Self-pay

## 2020-05-06 DIAGNOSIS — K581 Irritable bowel syndrome with constipation: Secondary | ICD-10-CM

## 2020-05-06 MED ORDER — LINACLOTIDE 72 MCG PO CAPS: 72.0000 ug | ORAL_CAPSULE | Freq: Every day | ORAL | 6 refills | Status: DC

## 2020-05-06 NOTE — Telephone Encounter (Signed)
Please review patient is requesting refill on medication that has been discontinued in chart. KW    Copied from [place] [id number]. Topic: General - Other >> May 06, 2020 10:24 AM Rainey Pines A wrote: Patient requesting callback in regards to linzess medication request

## 2020-05-06 NOTE — Telephone Encounter (Signed)
Refill sent to patients pharmacy. 

## 2020-05-17 NOTE — Telephone Encounter (Signed)
Try Linzess 145 daily--f/u 1-2 months on this

## 2020-05-18 ENCOUNTER — Other Ambulatory Visit: Payer: Medicare Other

## 2020-05-18 NOTE — Telephone Encounter (Signed)
Medication sent in for patient on 9/10.

## 2020-05-23 ENCOUNTER — Other Ambulatory Visit: Payer: Self-pay | Admitting: Family Medicine

## 2020-05-23 DIAGNOSIS — E119 Type 2 diabetes mellitus without complications: Secondary | ICD-10-CM | POA: Diagnosis not present

## 2020-05-23 DIAGNOSIS — H353221 Exudative age-related macular degeneration, left eye, with active choroidal neovascularization: Secondary | ICD-10-CM | POA: Diagnosis not present

## 2020-05-23 DIAGNOSIS — H353211 Exudative age-related macular degeneration, right eye, with active choroidal neovascularization: Secondary | ICD-10-CM | POA: Diagnosis not present

## 2020-05-23 NOTE — Telephone Encounter (Signed)
Requested medication (s) are due for refill today: yes  Requested medication (s) are on the active medication list: yes  Last refill:  03/02/20  Future visit scheduled: yes  Notes to clinic:  not delegated    Requested Prescriptions  Pending Prescriptions Disp Refills   traMADol (ULTRAM) 50 MG tablet [Pharmacy Med Name: TRAMADOL HCL 50 MG TABLET] 150 tablet     Sig: TAKE 2 TABLETS (100 MG TOTAL) BY MOUTH EVERY 6 (SIX) HOURS AS NEEDED.      Not Delegated - Analgesics:  Opioid Agonists Failed - 05/23/2020 11:18 AM      Failed - This refill cannot be delegated      Failed - Urine Drug Screen completed in last 360 days.      Passed - Valid encounter within last 6 months    Recent Outpatient Visits           3 months ago Benign essential HTN   [facility] Jerrol Banana., [facility]   5 months ago Chest pain, unspecified type   [facility] Jerrol Banana., MD   6 months ago Chest pain, unspecified type   [facility] Jerrol Banana., [facility]   11 months ago Urinary frequency   [facility] Jerrol Banana., MD   1 year ago Chronic midline low back pain, unspecified whether sciatica present   [facility] Jerrol Banana., MD       Future Appointments             In 4 weeks Jerrol Banana., MD [facility]

## 2020-05-27 ENCOUNTER — Other Ambulatory Visit: Payer: Self-pay | Admitting: Family Medicine

## 2020-05-27 DIAGNOSIS — I10 Essential (primary) hypertension: Secondary | ICD-10-CM

## 2020-05-27 NOTE — Telephone Encounter (Signed)
Requested Prescriptions  Pending Prescriptions Disp Refills  . enalapril (VASOTEC) 20 MG tablet [Pharmacy Med Name: ENALAPRIL MALEATE 20 MG TAB] 90 tablet 3    Sig: TAKE 1 TABLET BY MOUTH EVERY DAY     Cardiovascular:  ACE Inhibitors Passed - 05/27/2020  1:39 AM      Passed - Cr in normal range and within 180 days    Creatinine, Ser  Date Value Ref Range Status  02/17/2020 0.90 0.57 - 1.00 mg/dL Final         Passed - K in normal range and within 180 days    Potassium  Date Value Ref Range Status  02/17/2020 4.3 3.5 - 5.2 mmol/L Final         Passed - Patient is not pregnant      Passed - Last BP in normal range    BP Readings from Last 1 Encounters:  02/17/20 108/65         Passed - Valid encounter within last 6 months    Recent Outpatient Visits          3 months ago Benign essential HTN   [facility] Jerrol Banana., MD   5 months ago Chest pain, unspecified type   [facility] Jerrol Banana., MD   7 months ago Chest pain, unspecified type   [facility] Jerrol Banana., MD   11 months ago Urinary frequency   [facility] Jerrol Banana., MD   1 year ago Chronic midline low back pain, unspecified whether sciatica present   [facility] Jerrol Banana., MD      Future Appointments            In 3 weeks Jerrol Banana., MD [facility]           . traZODone (DESYREL) 50 MG tablet [Pharmacy Med Name: TRAZODONE 50 MG TABLET] 90 tablet 1    Sig: TAKE 1 TABLET BY MOUTH EVERYDAY AT BEDTIME     Psychiatry: Antidepressants - Serotonin Modulator Passed - 05/27/2020  1:39 AM      Passed - Valid encounter within last 6 months    Recent Outpatient Visits          3 months ago Benign essential HTN   [facility] Jerrol Banana., MD   5 months ago Chest pain, unspecified type   [facility] Jerrol Banana., MD   7 months ago Chest pain, unspecified type   [facility] Jerrol Banana., MD   11 months ago Urinary frequency   [facility] Jerrol Banana., MD   1 year ago Chronic midline low back pain, unspecified whether sciatica present   [facility] Jerrol Banana., MD      Future Appointments            In 3 weeks Jerrol Banana., MD [facility]

## 2020-05-28 ENCOUNTER — Other Ambulatory Visit: Payer: Self-pay | Admitting: Family Medicine

## 2020-05-28 NOTE — Telephone Encounter (Signed)
  Notes to clinic Pharmacy notes that pt is taking 25mg  of Lopressor daily, not 37.5 twice a day as ordered. Please assess.

## 2020-06-20 ENCOUNTER — Other Ambulatory Visit: Payer: Self-pay | Admitting: Family Medicine

## 2020-06-20 DIAGNOSIS — G8929 Other chronic pain: Secondary | ICD-10-CM

## 2020-06-21 ENCOUNTER — Ambulatory Visit: Payer: Self-pay | Admitting: Family Medicine

## 2020-06-27 DIAGNOSIS — H353211 Exudative age-related macular degeneration, right eye, with active choroidal neovascularization: Secondary | ICD-10-CM | POA: Diagnosis not present

## 2020-07-06 ENCOUNTER — Other Ambulatory Visit: Payer: Self-pay | Admitting: Family Medicine

## 2020-07-06 NOTE — Telephone Encounter (Signed)
Requested Prescriptions  Pending Prescriptions Disp Refills  . sertraline (ZOLOFT) 25 MG tablet [Pharmacy Med Name: SERTRALINE HCL 25 MG TABLET] 90 tablet 0    Sig: TAKE 1 TABLET BY MOUTH EVERY DAY     Psychiatry:  Antidepressants - SSRI Passed - 07/06/2020  1:45 AM      Passed - Valid encounter within last 6 months    Recent Outpatient Visits          4 months ago Benign essential HTN   [facility] Jerrol Banana., MD   6 months ago Chest pain, unspecified type   [facility] Jerrol Banana., MD   8 months ago Chest pain, unspecified type   [facility] Jerrol Banana., MD   1 year ago Urinary frequency   [facility] Jerrol Banana., MD   1 year ago Chronic midline low back pain, unspecified whether sciatica present   [facility] Jerrol Banana., MD

## 2020-07-17 ENCOUNTER — Other Ambulatory Visit: Payer: Self-pay | Admitting: Family Medicine

## 2020-07-17 DIAGNOSIS — I10 Essential (primary) hypertension: Secondary | ICD-10-CM

## 2020-07-26 DIAGNOSIS — H353211 Exudative age-related macular degeneration, right eye, with active choroidal neovascularization: Secondary | ICD-10-CM | POA: Diagnosis not present

## 2020-08-01 DIAGNOSIS — H353211 Exudative age-related macular degeneration, right eye, with active choroidal neovascularization: Secondary | ICD-10-CM | POA: Diagnosis not present

## 2020-09-08 ENCOUNTER — Ambulatory Visit (INDEPENDENT_AMBULATORY_CARE_PROVIDER_SITE_OTHER): Payer: Medicare Other | Admitting: Family Medicine

## 2020-09-08 DIAGNOSIS — Z23 Encounter for immunization: Secondary | ICD-10-CM | POA: Diagnosis not present

## 2020-09-13 DIAGNOSIS — H353221 Exudative age-related macular degeneration, left eye, with active choroidal neovascularization: Secondary | ICD-10-CM | POA: Diagnosis not present

## 2020-09-13 DIAGNOSIS — H353211 Exudative age-related macular degeneration, right eye, with active choroidal neovascularization: Secondary | ICD-10-CM | POA: Diagnosis not present

## 2020-09-19 ENCOUNTER — Other Ambulatory Visit: Payer: Self-pay | Admitting: Family Medicine

## 2020-09-19 DIAGNOSIS — M25511 Pain in right shoulder: Secondary | ICD-10-CM

## 2020-09-19 DIAGNOSIS — G8929 Other chronic pain: Secondary | ICD-10-CM

## 2020-10-09 ENCOUNTER — Other Ambulatory Visit: Payer: Self-pay | Admitting: Family Medicine

## 2020-10-17 DIAGNOSIS — H353211 Exudative age-related macular degeneration, right eye, with active choroidal neovascularization: Secondary | ICD-10-CM | POA: Diagnosis not present

## 2020-10-17 DIAGNOSIS — H353221 Exudative age-related macular degeneration, left eye, with active choroidal neovascularization: Secondary | ICD-10-CM | POA: Diagnosis not present

## 2020-10-21 ENCOUNTER — Encounter: Payer: Self-pay | Admitting: Adult Health

## 2020-10-21 ENCOUNTER — Other Ambulatory Visit: Payer: Self-pay

## 2020-10-21 ENCOUNTER — Ambulatory Visit (INDEPENDENT_AMBULATORY_CARE_PROVIDER_SITE_OTHER): Payer: Medicare Other | Admitting: Adult Health

## 2020-10-21 VITALS — BP 134/75 | HR 72 | Temp 98.1°F | Resp 16 | Wt 139.4 lb

## 2020-10-21 DIAGNOSIS — B029 Zoster without complications: Secondary | ICD-10-CM

## 2020-10-21 MED ORDER — FAMCICLOVIR 500 MG PO TABS: 500.0000 mg | ORAL_TABLET | Freq: Three times a day (TID) | ORAL | 0 refills | Status: AC

## 2020-10-21 MED ORDER — LIDOCAINE-PRILOCAINE 2.5-2.5 % EX CREA: 1.0000 "application " | TOPICAL_CREAM | Freq: Three times a day (TID) | CUTANEOUS | 0 refills | Status: DC | PRN

## 2020-10-21 NOTE — Progress Notes (Signed)
Established patient visit   Patient: Lynn Wall   DOB: Jan 27, 1927   87 y.o. Female  MRN: [id number] Visit Date: 10/21/2020  Today's healthcare provider: Marcille Buffy, FNP   Chief Complaint  Patient presents with  . Rash   Subjective    Rash This is a new problem. Location: left breast and nipple. The rash is characterized by pain and redness. She was exposed to nothing. Associated symptoms include joint pain. Pertinent negatives include no anorexia, congestion, cough, diarrhea, eye pain, facial edema, fatigue, fever, nail changes, rhinorrhea, shortness of breath, sore throat or vomiting. Treatments tried: Tramadol and Aleve. The treatment provided no relief.    Onset was one week ago. She reports she started having pain in her left shoulder and now she has a rash on her left nipple and left chest. She states " it is so painful I can not touch it". Rash occurred 2 days ago. Pain was prior around one week ago.   Denies any new medications or exposures.  Denies any recent illness or exposures.   She has tramadol at home. She is on Gabapentin 100mg  BID.   Patient  denies any fever, body aches,chills, chest pain, shortness of breath, nausea, vomiting, or diarrhea.  Denies dizziness, lightheadedness, pre syncopal or syncopal episodes.    Past Medical History:  Diagnosis Date  . Acid reflux   . Allergy   . Cystitis   . DDD (degenerative disc disease), lumbar   . Diabetes mellitus without complication ([place])   . GERD (gastroesophageal reflux disease)   . History of kidney stones   . History of renal stone   . Hyperlipidemia   . Hypertension   . Osteoporosis   . Urinary incontinence   . UTI (urinary tract infection)    currently on antibiotic   Past Surgical History:  Procedure Laterality Date  . ABDOMINAL HYSTERECTOMY    . CATARACT EXTRACTION, BILATERAL    . HEMORRHOID SURGERY    . LUMBAR LAMINECTOMY/DECOMPRESSION MICRODISCECTOMY Left 03/14/2015    Procedure: LUMBAR LAMINECTOMY/DECOMPRESSION MICRODISCECTOMY 1 LEVEL;  Surgeon: Newman Pies, MD;  Location: [facility];  Service: Neurosurgery;  Laterality: Left;  Left L34 microdiskectomy   Social History   Tobacco Use  . Smoking status: Never Smoker  . Smokeless tobacco: Never Used  Vaping Use  . Vaping Use: Never used  Substance Use Topics  . Alcohol use: No    Alcohol/week: 0.0 standard drinks  . Drug use: No   Social History   Socioeconomic History  . Marital status: Married    Spouse name: Not on file  . Number of children: 3  . Years of education: Not on file  . Highest education level: 8th grade  Occupational History  . Occupation: retired  Tobacco Use  . Smoking status: Never Smoker  . Smokeless tobacco: Never Used  Vaping Use  . Vaping Use: Never used  Substance and Sexual Activity  . Alcohol use: No    Alcohol/week: 0.0 standard drinks  . Drug use: No  . Sexual activity: Not Currently  Other Topics Concern  . Not on file  Social History Narrative  . Not on file   Social Determinants of Health   Financial Resource Strain: Low Risk   . Difficulty of Paying Living Expenses: Not hard at all  Food Insecurity: No Food Insecurity  . Worried About Charity fundraiser in the Last Year: Never true  . Ran Out of Food in the  Last Year: Never true  Transportation Needs: No Transportation Needs  . Lack of Transportation (Medical): No  . Lack of Transportation (Non-Medical): No  Physical Activity: Inactive  . Days of Exercise per Week: 0 days  . Minutes of Exercise per Session: 0 min  Stress: Stress Concern Present  . Feeling of Stress : Rather much  Social Connections: Moderately Integrated  . Frequency of Communication with Friends and Family: More than three times a week  . Frequency of Social Gatherings with Friends and Family: More than three times a week  . Attends Religious Services: Never  . Active Member of Clubs or Organizations: Yes  . Attends  Archivist Meetings: More than 4 times per year  . Marital Status: Married  Human resources officer Violence: Not At Risk  . Fear of Current or Ex-Partner: No  . Emotionally Abused: No  . Physically Abused: No  . Sexually Abused: No   Family Status  Relation Name Status  . Mother  Deceased  . Father  Deceased at age 15       stomach cancer  . Sister  Deceased  . Neg Hx  (Not Specified)   Family History  Problem Relation Age of Onset  . Cancer Father   . Kidney disease Neg Hx   . Prostate cancer Neg Hx   . Bladder Cancer Neg Hx    Allergies  Allergen Reactions  . Remeron [Mirtazapine]     Dizziness and confusion        Medications: Outpatient Medications Prior to Visit  Medication Sig  . aspirin EC 81 MG tablet Take 81 mg by mouth daily.  Marland Kitchen dicyclomine (BENTYL) 20 MG tablet Take 1 tablet (20 mg total) by mouth 2 (two) times daily as needed for spasms.  Marland Kitchen docusate sodium (STOOL SOFTENER) 100 MG capsule Take 1 capsule (100 mg total) by mouth daily.  . enalapril (VASOTEC) 10 MG tablet Take 10 mg by mouth 2 (two) times daily.   Marland Kitchen gabapentin (NEURONTIN) 100 MG capsule TAKE 1 CAPSULE BY MOUTH TWICE A DAY  . isosorbide mononitrate (IMDUR) 30 MG 24 hr tablet Take 1 tablet (30 mg total) by mouth daily.  Marland Kitchen linaclotide (LINZESS) 72 MCG capsule Take 1 capsule (72 mcg total) by mouth daily before breakfast.  . metoprolol tartrate (LOPRESSOR) 25 MG tablet TAKE 1.5 TABLET TWICE A DAY  . Multiple Vitamins-Minerals (PRESERVISION AREDS 2 PO) Take 1 capsule by mouth daily.  . nitroGLYCERIN (NITROSTAT) 0.4 MG SL tablet Place 1 tablet (0.4 mg total) under the tongue every 5 (five) minutes as needed for chest pain.  Marland Kitchen omeprazole (PRILOSEC) 20 MG capsule Take 1 capsule (20 mg total) by mouth daily. (Patient taking differently: Take 20 mg by mouth daily. As needed)  . ondansetron (ZOFRAN) 4 MG tablet 1/2 to 1 tablet every 6 hours as needed nausea  . polyethylene glycol (MIRALAX / GLYCOLAX)  packet Take 17 g by mouth daily. (Patient taking differently: Take 17 g by mouth daily as needed.)  . sertraline (ZOLOFT) 25 MG tablet TAKE 1 TABLET BY MOUTH EVERY DAY  . simvastatin (ZOCOR) 40 MG tablet Take 40 mg by mouth at bedtime.  . traMADol (ULTRAM) 50 MG tablet TAKE 2 TABLETS (100 MG TOTAL) BY MOUTH EVERY 6 (SIX) HOURS AS NEEDED.  Marland Kitchen traZODone (DESYREL) 50 MG tablet TAKE 1 TABLET BY MOUTH EVERYDAY AT BEDTIME  . clidinium-chlordiazePOXIDE (LIBRAX) 5-2.5 MG capsule Take 1 capsule by mouth 2 (two) times daily as needed. (Patient not  taking: No sig reported)  . lisinopril (ZESTRIL) 10 MG tablet TAKE 1 TABLET BY MOUTH EVERY DAY (Patient not taking: No sig reported)   No facility-administered medications prior to visit.    Review of Systems  Constitutional: Negative for fatigue and fever.  HENT: Negative for congestion, rhinorrhea and sore throat.   Eyes: Negative for pain.  Respiratory: Negative for cough and shortness of breath.   Gastrointestinal: Negative for anorexia, diarrhea and vomiting.  Musculoskeletal: Positive for joint pain.  Skin: Positive for rash. Negative for nail changes.    Last CBC Lab Results  Component Value Date   WBC 4.8 02/17/2020   HGB 10.6 (L) 02/17/2020   HCT 32.6 (L) 02/17/2020   MCV 90 02/17/2020   MCH 29.1 02/17/2020   RDW 12.9 02/17/2020   PLT 148 (L) 51/88/4166   Last metabolic panel Lab Results  Component Value Date   GLUCOSE 125 (H) 02/17/2020   NA 136 02/17/2020   K 4.3 02/17/2020   CL 101 02/17/2020   CO2 22 02/17/2020   BUN 20 02/17/2020   CREATININE 0.90 02/17/2020   GFRNONAA 56 (L) 02/17/2020   GFRAA 65 02/17/2020   CALCIUM 9.2 02/17/2020   PROT 6.0 02/17/2020   ALBUMIN 4.2 02/17/2020   LABGLOB 1.8 02/17/2020   AGRATIO 2.3 (H) 02/17/2020   BILITOT 0.3 02/17/2020   ALKPHOS 63 02/17/2020   AST 20 02/17/2020   ALT 10 02/17/2020   ANIONGAP 7 03/07/2015       Objective    BP 134/75   Pulse 72   Temp 98.1 F (36.7 C)  (Oral)   Resp 16   Wt 139 lb 6.4 oz (63.2 kg)   LMP  (LMP Unknown)   SpO2 97%   BMI 24.69 kg/m  Wt Readings from Last 3 Encounters:  10/21/20 139 lb 6.4 oz (63.2 kg)  02/17/20 138 lb (62.6 kg)  12/14/19 139 lb 6.4 oz (63.2 kg)       Physical Exam Vitals reviewed.  Constitutional:      General: She is not in acute distress.    Appearance: Normal appearance. She is not ill-appearing, toxic-appearing or diaphoretic.     Comments: Frail elderly lady in no acute distress.   HENT:     Head: Normocephalic and atraumatic.     Right Ear: External ear normal.     Left Ear: External ear normal.     Nose: Nose normal.     Mouth/Throat:     Mouth: Mucous membranes are moist.     Pharynx: Oropharynx is clear.  Eyes:     General: No scleral icterus.    Conjunctiva/sclera: Conjunctivae normal.  Cardiovascular:     Rate and Rhythm: Normal rate and regular rhythm.     Pulses: Normal pulses.     Heart sounds: Normal heart sounds.  Pulmonary:     Effort: Pulmonary effort is normal.     Breath sounds: Normal breath sounds.  Abdominal:     General: There is no distension.     Palpations: Abdomen is soft.  Musculoskeletal:        General: Normal range of motion.     Cervical back: Neck supple.  Skin:    Findings: Erythema and rash present. Rash is crusting and vesicular. Rash is not macular or papular.          Comments: Erythematous rash left upper chest and around scapula left back with scattered vesicle lesions and some with crusting. No warmth or drainage.  Zoster rash.  Neurological:     Mental Status: She is alert and oriented to person, place, and time.     Gait: Gait (walks with cane. ) normal.  Psychiatric:        Mood and Affect: Mood normal.        Behavior: Behavior normal.        Thought Content: Thought content normal.        Judgment: Judgment normal.      No results found for any visits on 10/21/20.  Assessment & Plan     Herpes zoster without complication -  Plan: CBC with Differential/Platelet, Comprehensive Metabolic Panel (CMET), famciclovir (FAMVIR) 500 MG tablet, lidocaine-prilocaine (EMLA) cream  Meds ordered this encounter  Medications  . famciclovir (FAMVIR) 500 MG tablet    Sig: Take 1 tablet (500 mg total) by mouth 3 (three) times daily for 7 days.    Dispense:  21 tablet    Refill:  0  . lidocaine-prilocaine (EMLA) cream    Sig: Apply 1 application topically 3 (three) times daily as needed. Do not apply to any open areas or blisters.    Dispense:  30 g    Refill:  0   An After Visit Summary was printed and given to the patient.'   If prescription cream is not covered ask the pharmacist to show you where the over the counter shingle cream is and use that but do not apply to any areas that are open skin  or blisters and only if ares.  Continue your Gabapentin as directed and use your Tramadol as prescribed.  If any facial involvement occurs contact us and if any eye involvement occurs    ( see opthalmology immediately) Call if pain is worsening. Discussed course with patient.  Return if any symptoms are worsening.   Red Flags discussed. The patient was given clear instructions to go to ER or return to medical center if any red flags develop, symptoms do not improve, worsen or new problems develop. They verbalized understanding.  Return in about 1 week (around 10/28/2020), or if symptoms worsen or fail to improve, for at any time for any worsening symptoms, Go to Emergency room/ urgent care if worse.      The entirety of the information documented in the History of Present Illness, Review of Systems and Physical Exam were personally obtained by me. Portions of this information were initially documented by the CMA and reviewed by me for thoroughness and accuracy.  Marcille Buffy, [place] [phone] (phone) [phone] (fax)  [place]

## 2020-10-21 NOTE — Patient Instructions (Addendum)
If prescription cream is not covered ask the pharmacist to show you where the over the counter shingle cream and use that but do not  Apply cream  to any areas that are open or blisters.  Continue your Gabapentin as directed and use your Tramadol as prescribed.  If any facial involvement contact us or if any eye involvement ( see opthalmology immediately)  Return if any symptoms are worsening.  Shingles  Shingles, which is also known as herpes zoster, is an infection that causes a painful skin rash and fluid-filled blisters. It is caused by a virus. Shingles only develops in people who:  Have had chickenpox.  Have been given a medicine to protect against chickenpox (have been vaccinated). Shingles is rare in this group. What are the causes? Shingles is caused by varicella-zoster virus (VZV). This is the same virus that causes chickenpox. After a person is exposed to VZV, the virus stays in the body in an inactive (dormant) state. Shingles develops if the virus is reactivated. This can happen many years after the first (initial) exposure to VZV. It is not known what causes this virus to be reactivated. What increases the risk? People who have had chickenpox or received the chickenpox vaccine are at risk for shingles. Shingles infection is more common in people who:  Are older than age 81.  Have a weakened disease-fighting system (immune system), such as people with: ? HIV. ? AIDS. ? Cancer.  Are taking medicines that weaken the immune system, such as transplant medicines.  Are experiencing a lot of stress. What are the signs or symptoms? Early symptoms of this condition include itching, tingling, and pain in an area on your skin. Pain may be described as burning, stabbing, or throbbing. A few days or weeks after early symptoms start, a painful red rash appears. The rash is usually on one side of the body and has a band-like or belt-like pattern. The rash eventually turns into  fluid-filled blisters that break open, change into scabs, and dry up in about 2-3 weeks. At any time during the infection, you may also develop:  A fever.  Chills.  A headache.  An upset stomach. How is this diagnosed? This condition is diagnosed with a skin exam. Skin or fluid samples may be taken from the blisters before a diagnosis is made. These samples are examined under a microscope or sent to a lab for testing. How is this treated? The rash may last for several weeks. There is not a specific cure for this condition. Your health care provider will probably prescribe medicines to help you manage pain, recover more quickly, and avoid long-term problems. Medicines may include:  Antiviral drugs.  Anti-inflammatory drugs.  Pain medicines.  Anti-itching medicines (antihistamines). If the area involved is on your face, you may be referred to a specialist, such as an eye doctor (ophthalmologist) or an ear, nose, and throat (ENT) doctor (otolaryngologist) to help you avoid eye problems, chronic pain, or disability. Follow these instructions at home: Medicines  Take over-the-counter and prescription medicines only as told by your health care provider.  Apply an anti-itch cream or numbing cream to the affected area as told by your health care provider. Relieving itching and discomfort  Apply cold, wet cloths (cold compresses) to the area of the rash or blisters as told by your health care provider.  Cool baths can be soothing. Try adding baking soda or dry oatmeal to the water to reduce itching. Do not bathe in  hot water.   Blister and rash care  Keep your rash covered with a loose bandage (dressing). Wear loose-fitting clothing to help ease the pain of material rubbing against the rash.  Keep your rash and blisters clean by washing the area with mild soap and cool water as told by your health care provider.  Check your rash every day for signs of infection. Check for: ? More  redness, swelling, or pain. ? Fluid or blood. ? Warmth. ? Pus or a bad smell.  Do not scratch your rash or pick at your blisters. To help avoid scratching: ? Keep your fingernails clean and cut short. ? Wear gloves or mittens while you sleep, if scratching is a problem. General instructions  Rest as told by your health care provider.  Keep all follow-up visits as told by your health care provider. This is important.  Wash your hands often with soap and water. If soap and water are not available, use hand sanitizer. Doing this lowers your chance of getting a bacterial skin infection.  Before your blisters change into scabs, your shingles infection can cause chickenpox in people who have never had it or have never been vaccinated against it. To prevent this from happening, avoid contact with other people, especially: ? Babies. ? Pregnant women. ? Children who have eczema. ? Elderly people who have transplants. ? People who have chronic illnesses, such as cancer or AIDS. Contact a health care provider if:  Your pain is not relieved with prescribed medicines.  Your pain does not get better after the rash heals.  You have signs of infection in the rash area, such as: ? More redness, swelling, or pain around the rash. ? Fluid or blood coming from the rash. ? The rash area feeling warm to the touch. ? Pus or a bad smell coming from the rash. Get help right away if:  The rash is on your face or nose.  You have facial pain, pain around your eye area, or loss of feeling on one side of your face.  You have difficulty seeing.  You have ear pain or have ringing in your ear.  You have a loss of taste.  Your condition gets worse. Summary  Shingles, which is also known as herpes zoster, is an infection that causes a painful skin rash and fluid-filled blisters.  This condition is diagnosed with a skin exam. Skin or fluid samples may be taken from the blisters and examined before the  diagnosis is made.  Keep your rash covered with a loose bandage (dressing). Wear loose-fitting clothing to help ease the pain of material rubbing against the rash.  Before your blisters change into scabs, your shingles infection can cause chickenpox in people who have never had it or have never been vaccinated against it. This information is not intended to replace advice given to you by your health care provider. Make sure you discuss any questions you have with your health care provider. Document Revised: 12/05/2018 Document Reviewed: 04/17/2017 Elsevier Patient Education  2021 [place]. Famciclovir tablets What is this medicine? FAMCICLOVIR (fam SYE kloe veer) is an antiviral medicine. It is used to treat or prevent infections caused by certain kinds of viruses. Examples of these infections are herpes and shingles. This medicine will not cure herpes. This medicine may be used for other purposes; ask your health care provider or pharmacist if you have questions. COMMON BRAND NAME(S): Famvir What should I tell my health care provider before I take this  medicine? They need to know if you have any of these conditions:  kidney disease  an unusual or allergic reaction to famciclovir, penciclovir, other medicines, food, dyes, or preservatives  pregnant or trying to get pregnant  breast-feeding How should I use this medicine? Take this medicine by mouth with a glass of water. Follow the directions on the prescription label. You can take it with or without food. Take your medicine at regular intervals. Do not take your medicine more often than directed. Take all of your medicine as directed even if you think you are better. Do not skip doses or stop your medicine early. Talk to your pediatrician regarding the use of this medicine in children. Special care may be needed. Overdosage: If you think you have taken too much of this medicine contact a poison control center or emergency room at  once. NOTE: This medicine is only for you. Do not share this medicine with others. What if I miss a dose? If you miss a dose, take it as soon as you can. If it is almost time for your next dose, take only that dose. Do not take double or extra doses. What may interact with this medicine?  probenecid This list may not describe all possible interactions. Give your health care provider a list of all the medicines, herbs, non-prescription drugs, or dietary supplements you use. Also tell them if you smoke, drink alcohol, or use illegal drugs. Some items may interact with your medicine. What should I watch for while using this medicine? Tell your doctor or health care professional if your symptoms do not improve. This medicine works best when started very early in the course of an infection. Begin treatment at the first signs of infection. Drink 6 to 8 glasses of water or fluids every day while you are taking this medicine. This will help prevent side effects. You can still pass the shingles or herpes to another person even while you are taking this medicine. Avoid contact with others as directed. Genital herpes is a sexually transmitted disease. Talk to your doctor about how to stop the spread of infection. What side effects may I notice from receiving this medicine? Side effects that you should report to your doctor or health care professional as soon as possible:  allergic reactions like skin rash, itching or hives, swelling of the face, lips, or tongue  confusion, hallucinations Side effects that usually do not require medical attention (report to your doctor or health care professional if they continue or are bothersome):  diarrhea  headache  nausea, vomiting  stomach upset  unusually weak or tired This list may not describe all possible side effects. Call your doctor for medical advice about side effects. You may report side effects to FDA at 1-800-FDA-1088. Where should I keep my  medicine? Keep out of the reach of children. Store at room temperature between 15 and 30 degrees C (59 and 86 degrees F). Throw away any unused medicine after the expiration date. NOTE: This sheet is a summary. It may not cover all possible information. If you have questions about this medicine, talk to your doctor, pharmacist, or health care provider.  2021 Elsevier/Gold Standard (2007-10-29 13:52:39)

## 2020-10-22 LAB — CBC WITH DIFFERENTIAL/PLATELET
Basophils Absolute: 0 10*3/uL (ref 0.0–0.2)
Basos: 1 %
EOS (ABSOLUTE): 0.1 10*3/uL (ref 0.0–0.4)
Eos: 2 %
Hematocrit: 36.2 % (ref 34.0–46.6)
Hemoglobin: 11.6 g/dL (ref 11.1–15.9)
Immature Grans (Abs): 0 10*3/uL (ref 0.0–0.1)
Immature Granulocytes: 0 %
Lymphocytes Absolute: 1 10*3/uL (ref 0.7–3.1)
Lymphs: 23 %
MCH: 30 pg (ref 26.6–33.0)
MCHC: 32 g/dL (ref 31.5–35.7)
MCV: 94 fL (ref 79–97)
Monocytes Absolute: 0.4 10*3/uL (ref 0.1–0.9)
Monocytes: 10 %
Neutrophils Absolute: 2.7 10*3/uL (ref 1.4–7.0)
Neutrophils: 64 %
Platelets: 143 10*3/uL — ABNORMAL LOW (ref 150–450)
RBC: 3.87 x10E6/uL (ref 3.77–5.28)
RDW: 12.5 % (ref 11.7–15.4)
WBC: 4.2 10*3/uL (ref 3.4–10.8)

## 2020-10-22 LAB — COMPREHENSIVE METABOLIC PANEL
ALT: 15 IU/L (ref 0–32)
AST: 22 IU/L (ref 0–40)
Albumin/Globulin Ratio: 2 (ref 1.2–2.2)
Albumin: 4.3 g/dL (ref 3.5–4.6)
Alkaline Phosphatase: 58 IU/L (ref 44–121)
BUN/Creatinine Ratio: 20 (ref 12–28)
BUN: 18 mg/dL (ref 10–36)
Bilirubin Total: 0.3 mg/dL (ref 0.0–1.2)
CO2: 22 mmol/L (ref 20–29)
Calcium: 9.5 mg/dL (ref 8.7–10.3)
Chloride: 102 mmol/L (ref 96–106)
Creatinine, Ser: 0.92 mg/dL (ref 0.57–1.00)
GFR calc Af Amer: 63 mL/min/{1.73_m2} (ref >59)
GFR calc non Af Amer: 55 mL/min/{1.73_m2} — ABNORMAL LOW (ref >59)
Globulin, Total: 2.1 g/dL (ref 1.5–4.5)
Glucose: 148 mg/dL — ABNORMAL HIGH (ref 65–99)
Potassium: 4.8 mmol/L (ref 3.5–5.2)
Sodium: 139 mmol/L (ref 134–144)
Total Protein: 6.4 g/dL (ref 6.0–8.5)

## 2020-10-25 NOTE — Progress Notes (Signed)
CBC ok, stable platelets.  She was not fasting, glucose was elevated, she was seen for shingles, A1C at next visit follow up may be warranted. Kidney function stable.

## 2020-11-03 ENCOUNTER — Other Ambulatory Visit: Payer: Self-pay | Admitting: Family Medicine

## 2020-11-03 NOTE — Telephone Encounter (Signed)
Courtesy refill. Requested Prescriptions  Pending Prescriptions Disp Refills  . isosorbide mononitrate (IMDUR) 30 MG 24 hr tablet [Pharmacy Med Name: ISOSORBIDE MONONIT ER 30 MG TB] 30 tablet 0    Sig: TAKE 1 TABLET BY MOUTH EVERY DAY     Cardiovascular:  Nitrates Passed - 11/03/2020  1:23 AM      Passed - Last BP in normal range    BP Readings from Last 1 Encounters:  10/21/20 134/75         Passed - Last Heart Rate in normal range    Pulse Readings from Last 1 Encounters:  10/21/20 72         Passed - Valid encounter within last 12 months    Recent Outpatient Visits          1 week ago Herpes zoster without complication   [place], FNP   8 months ago Benign essential HTN   [facility] Jerrol Banana., MD   10 months ago Chest pain, unspecified type   [facility] Jerrol Banana., MD   1 year ago Chest pain, unspecified type   [facility] Jerrol Banana., MD   1 year ago Urinary frequency   [facility] Jerrol Banana., MD

## 2020-11-16 ENCOUNTER — Other Ambulatory Visit: Payer: Self-pay | Admitting: *Deleted

## 2020-11-16 DIAGNOSIS — G8929 Other chronic pain: Secondary | ICD-10-CM

## 2020-11-16 MED ORDER — GABAPENTIN 100 MG PO CAPS: 100.0000 mg | ORAL_CAPSULE | Freq: Three times a day (TID) | ORAL | 1 refills | Status: DC

## 2020-11-24 ENCOUNTER — Other Ambulatory Visit: Payer: Self-pay | Admitting: Family Medicine

## 2020-11-24 NOTE — Telephone Encounter (Signed)
Courtesy refill - patient needs office visit. Requested Prescriptions  Pending Prescriptions Disp Refills  . traZODone (DESYREL) 50 MG tablet [Pharmacy Med Name: TRAZODONE 50 MG TABLET] 30 tablet 0    Sig: TAKE 1 TABLET BY MOUTH EVERYDAY AT BEDTIME     Psychiatry: Antidepressants - Serotonin Modulator Passed - 11/24/2020  2:04 AM      Passed - Valid encounter within last 6 months    Recent Outpatient Visits          1 month ago Herpes zoster without complication   [facility] Flinchum, Kelby Aline, FNP   9 months ago Benign essential HTN   [facility] Jerrol Banana., MD   11 months ago Chest pain, unspecified type   [facility] Jerrol Banana., MD   1 year ago Chest pain, unspecified type   [facility] Jerrol Banana., MD   1 year ago Urinary frequency   [facility] Jerrol Banana., MD

## 2020-11-27 ENCOUNTER — Other Ambulatory Visit: Payer: Self-pay | Admitting: Family Medicine

## 2020-11-27 NOTE — Telephone Encounter (Signed)
Requested Prescriptions  Pending Prescriptions Disp Refills  . isosorbide mononitrate (IMDUR) 30 MG 24 hr tablet [Pharmacy Med Name: ISOSORBIDE MONONIT ER 30 MG TB] 90 tablet 0    Sig: TAKE 1 TABLET BY MOUTH EVERY DAY     Cardiovascular:  Nitrates Passed - 11/27/2020  9:31 AM      Passed - Last BP in normal range    BP Readings from Last 1 Encounters:  10/21/20 134/75         Passed - Last Heart Rate in normal range    Pulse Readings from Last 1 Encounters:  10/21/20 72         Passed - Valid encounter within last 12 months    Recent Outpatient Visits          1 month ago Herpes zoster without complication   [facility] [place], Kelby Aline, FNP   9 months ago Benign essential HTN   [facility] Jerrol Banana., MD   11 months ago Chest pain, unspecified type   [facility] Jerrol Banana., MD   1 year ago Chest pain, unspecified type   [facility] Jerrol Banana., MD   1 year ago Urinary frequency   [facility] Jerrol Banana., MD

## 2020-11-28 DIAGNOSIS — H353211 Exudative age-related macular degeneration, right eye, with active choroidal neovascularization: Secondary | ICD-10-CM | POA: Diagnosis not present

## 2020-11-28 DIAGNOSIS — H353221 Exudative age-related macular degeneration, left eye, with active choroidal neovascularization: Secondary | ICD-10-CM | POA: Diagnosis not present

## 2020-12-20 ENCOUNTER — Other Ambulatory Visit: Payer: Self-pay | Admitting: Family Medicine

## 2020-12-24 ENCOUNTER — Other Ambulatory Visit: Payer: Self-pay | Admitting: Family Medicine

## 2021-01-03 DIAGNOSIS — H353221 Exudative age-related macular degeneration, left eye, with active choroidal neovascularization: Secondary | ICD-10-CM | POA: Diagnosis not present

## 2021-01-03 DIAGNOSIS — H353211 Exudative age-related macular degeneration, right eye, with active choroidal neovascularization: Secondary | ICD-10-CM | POA: Diagnosis not present

## 2021-01-04 ENCOUNTER — Ambulatory Visit: Payer: Self-pay | Admitting: *Deleted

## 2021-01-04 NOTE — Telephone Encounter (Signed)
See triage note below. KW

## 2021-01-04 NOTE — Telephone Encounter (Signed)
  Reason for Disposition . Health Information question, no triage required and triager able to answer question  Answer Assessment - Initial Assessment Questions 1. REASON FOR CALL or QUESTION: "What is your reason for calling today?" or "How can I best help you?" or "What question do you have that I can help answer?"     Medication question regarding Lopressor  Protocols used: [place]

## 2021-01-04 NOTE — Telephone Encounter (Signed)
No triage performed. Left message with requested information regarding Lopressor dosage.

## 2021-01-12 ENCOUNTER — Other Ambulatory Visit: Payer: Self-pay | Admitting: Family Medicine

## 2021-01-18 ENCOUNTER — Other Ambulatory Visit: Payer: Self-pay

## 2021-01-18 ENCOUNTER — Encounter: Payer: Self-pay | Admitting: Family Medicine

## 2021-01-18 ENCOUNTER — Ambulatory Visit (INDEPENDENT_AMBULATORY_CARE_PROVIDER_SITE_OTHER): Payer: Medicare Other | Admitting: Family Medicine

## 2021-01-18 VITALS — BP 158/72 | HR 59 | Resp 16 | Wt 138.2 lb

## 2021-01-18 DIAGNOSIS — R5383 Other fatigue: Secondary | ICD-10-CM | POA: Diagnosis not present

## 2021-01-18 DIAGNOSIS — I10 Essential (primary) hypertension: Secondary | ICD-10-CM

## 2021-01-18 DIAGNOSIS — E119 Type 2 diabetes mellitus without complications: Secondary | ICD-10-CM | POA: Diagnosis not present

## 2021-01-18 DIAGNOSIS — K581 Irritable bowel syndrome with constipation: Secondary | ICD-10-CM

## 2021-01-18 DIAGNOSIS — M5136 Other intervertebral disc degeneration, lumbar region: Secondary | ICD-10-CM

## 2021-01-18 DIAGNOSIS — M810 Age-related osteoporosis without current pathological fracture: Secondary | ICD-10-CM

## 2021-01-18 LAB — POCT GLYCOSYLATED HEMOGLOBIN (HGB A1C): Hemoglobin A1C: 6.9 % — AB (ref 4.0–5.6)

## 2021-01-18 NOTE — Progress Notes (Signed)
Established patient visit   Patient: Lynn Wall   DOB: 09-24-1926   87 y.o. Female  MRN: [id number] Visit Date: 01/18/2021  Today's healthcare provider: [name], MD   No chief complaint on file.  Subjective    HPI  Patient comes in today for follow-up.  Her husband is now in assisted living and this is helped her stress level at home.  Linzess has helped her IBS. Diabetes Mellitus Type II, follow-up  Lab Results  Component Value Date   HGBA1C 6.9 (H) 02/17/2020   HGBA1C 6.7 01/10/2017   HGBA1C 6.7 (H) 03/12/2016   Last seen for diabetes 1 years ago.  Management since then includes continuing the same treatment. She reports poor compliance with treatment. She is not having side effects.    Home blood sugar records: not being checked  Episodes of hypoglycemia? No     Current insulin regiment: none Most Recent Eye Exam: within the past 6 month  --------------------------------------------------------------------------------------------------- Hypertension, follow-up  BP Readings from Last 3 Encounters:  01/18/21 (!) 158/72  10/21/20 134/75  02/17/20 108/65   Wt Readings from Last 3 Encounters:  01/18/21 138 lb 3.2 oz (62.7 kg)  10/21/20 139 lb 6.4 oz (63.2 kg)  02/17/20 138 lb (62.6 kg)     She was last seen for hypertension 1 years ago.  BP at that visit was 108/65. Management since that visit includes no medication changes. She reports poor compliance with treatment. She is not having side effects.   She is not exercising. She is not adherent to low salt diet.   Outside blood pressures are not being checked.  She does not smoke.  Use of agents associated with hypertension: none.   --------------------------------------------------------------------------------------------------- Lipid/Cholesterol, follow-up  Last Lipid Panel: Lab Results  Component Value Date   CHOL 135 01/16/2017   LDLCALC 69 01/16/2017   HDL 46  01/16/2017   TRIG 100 01/16/2017    She was last seen for this 1 years ago.  Management since that visit includes no medication changes.  She reports poor compliance with treatment. She is not having side effects.    Symptoms: No appetite changes No foot ulcerations  No chest pain No chest pressure/discomfort  No dyspnea No orthopnea  Yes fatigue Yes lower extremity edema  No palpitations No paroxysmal nocturnal dyspnea  No nausea No numbness or tingling of extremity  No polydipsia No polyuria  No speech difficulty No syncope   She is following a Regular diet. Current exercise: none  Last metabolic panel Lab Results  Component Value Date   GLUCOSE 148 (H) 10/21/2020   NA 139 10/21/2020   K 4.8 10/21/2020   BUN 18 10/21/2020   CREATININE 0.92 10/21/2020   GFRNONAA 55 (L) 10/21/2020   GFRAA 63 10/21/2020   CALCIUM 9.5 10/21/2020   AST 22 10/21/2020   ALT 15 10/21/2020   The ASCVD Risk score Mikey Bussing DC Jr., et al., 2013) failed to calculate for the following reasons:   The 2013 ASCVD risk score is only valid for ages 62 to 76  Irritable bowel syndrome, unspecified type From 02/17/2020-Carefully try Bentyl daily to twice daily.  Patient Active Problem List   Diagnosis Date Noted  . Herpes zoster without complication 76/16/0737  . Basal cell carcinoma 01/17/2016  . CN (constipation) 05/23/2015  . DDD (degenerative disc disease), lumbar 05/23/2015  . Diabetes mellitus type 2 without retinopathy ([place]) 05/23/2015  . Fatigue 05/23/2015  . Acid reflux 05/23/2015  .  LBP (low back pain) 05/23/2015  . Drusen of macula 05/23/2015  . Neuralgia neuritis, sciatic nerve 05/23/2015  . Basal cell papilloma 05/23/2015  . Acute urinary retention 03/28/2015  . Lumbar foraminal stenosis 03/14/2015  . Chronic cystitis 03/03/2015  . Cystocele, grade 2 02/18/2015  . History of kidney stones 02/18/2015  . Atrophic vaginitis 02/15/2015  . Recurrent UTI 02/15/2015  . Displacement of  lumbar intervertebral disc without myelopathy 12/01/2014  . Bulge of lumbar disc without myelopathy 12/01/2014  . Lumbar radiculitis 11/10/2014  . Internal hemorrhoids without complication 09/32/3557  . Flutter-fibrillation 03/22/2008  . Paroxysmal supraventricular tachycardia ([place]) 03/22/2008  . Colon, diverticulosis 10/28/2007  . History of colon polyps 10/28/2007  . Malignant neoplasm of skin of parts of face 10/28/2007  . Calculus of kidney 10/18/2007  . Benign essential HTN 10/18/2007  . OP (osteoporosis) 10/18/2007  . Diabetes ([place]) 10/16/2007  . Hypercholesteremia 10/16/2007   Past Medical History:  Diagnosis Date  . Acid reflux   . Allergy   . Cystitis   . DDD (degenerative disc disease), lumbar   . Diabetes mellitus without complication ([place])   . GERD (gastroesophageal reflux disease)   . History of kidney stones   . History of renal stone   . Hyperlipidemia   . Hypertension   . Osteoporosis   . Urinary incontinence   . UTI (urinary tract infection)    currently on antibiotic   Allergies  Allergen Reactions  . Remeron [Mirtazapine]     Dizziness and confusion        Medications: Outpatient Medications Prior to Visit  Medication Sig Note  . enalapril (VASOTEC) 20 MG tablet Take 20 mg by mouth daily.   Marland Kitchen aspirin EC 81 MG tablet Take 81 mg by mouth daily. (Patient not taking: Reported on 01/18/2021)   . clidinium-chlordiazePOXIDE (LIBRAX) 5-2.5 MG capsule Take 1 capsule by mouth 2 (two) times daily as needed. (Patient not taking: No sig reported)   . dicyclomine (BENTYL) 20 MG tablet Take 1 tablet (20 mg total) by mouth 2 (two) times daily as needed for spasms. (Patient not taking: Reported on 01/18/2021)   . docusate sodium (STOOL SOFTENER) 100 MG capsule Take 1 capsule (100 mg total) by mouth daily. (Patient not taking: Reported on 01/18/2021)   . gabapentin (NEURONTIN) 100 MG capsule Take 1 capsule (100 mg total) by mouth 3 (three) times daily. (Patient not  taking: Reported on 01/18/2021)   . isosorbide mononitrate (IMDUR) 30 MG 24 hr tablet TAKE 1 TABLET BY MOUTH EVERY DAY (Patient not taking: Reported on 01/18/2021)   . lidocaine-prilocaine (EMLA) cream Apply 1 application topically 3 (three) times daily as needed. Do not apply to any open areas or blisters. (Patient not taking: Reported on 01/18/2021)   . linaclotide (LINZESS) 72 MCG capsule Take 1 capsule (72 mcg total) by mouth daily before breakfast. (Patient not taking: Reported on 01/18/2021)   . lisinopril (ZESTRIL) 10 MG tablet TAKE 1 TABLET BY MOUTH EVERY DAY (Patient not taking: No sig reported)   . metoprolol tartrate (LOPRESSOR) 25 MG tablet TAKE 1.5 TABLET TWICE A DAY (Patient not taking: Reported on 01/18/2021)   . Multiple Vitamins-Minerals (PRESERVISION AREDS 2 PO) Take 1 capsule by mouth daily. (Patient not taking: Reported on 01/18/2021)   . nitroGLYCERIN (NITROSTAT) 0.4 MG SL tablet Place 1 tablet (0.4 mg total) under the tongue every 5 (five) minutes as needed for chest pain. (Patient not taking: Reported on 01/18/2021)   . omeprazole (PRILOSEC) 20 MG  capsule Take 1 capsule (20 mg total) by mouth daily. (Patient not taking: Reported on 01/18/2021) 01/18/2021: PRN  . ondansetron (ZOFRAN) 4 MG tablet 1/2 to 1 tablet every 6 hours as needed nausea (Patient not taking: Reported on 01/18/2021)   . polyethylene glycol (MIRALAX / GLYCOLAX) packet Take 17 g by mouth daily. (Patient not taking: Reported on 01/18/2021)   . sertraline (ZOLOFT) 25 MG tablet TAKE 1 TABLET BY MOUTH EVERY DAY (Patient not taking: Reported on 01/18/2021)   . simvastatin (ZOCOR) 40 MG tablet Take 40 mg by mouth at bedtime. (Patient not taking: Reported on 01/18/2021)   . traMADol (ULTRAM) 50 MG tablet TAKE 2 TABLETS (100 MG TOTAL) BY MOUTH EVERY 6 (SIX) HOURS AS NEEDED. (Patient not taking: Reported on 01/18/2021)   . traZODone (DESYREL) 50 MG tablet TAKE 1 TABLET BY MOUTH EVERYDAY AT BEDTIME (Patient not taking: Reported on  01/18/2021)   . [DISCONTINUED] enalapril (VASOTEC) 10 MG tablet Take 10 mg by mouth 2 (two) times daily.     No facility-administered medications prior to visit.    Review of Systems      Objective    BP (!) 158/72   Pulse (!) 59   Resp 16   Wt 138 lb 3.2 oz (62.7 kg)   LMP  (LMP Unknown)   SpO2 97%   BMI 24.48 kg/m  BP Readings from Last 3 Encounters:  01/18/21 (!) 158/72  10/21/20 134/75  02/17/20 108/65   Wt Readings from Last 3 Encounters:  01/18/21 138 lb 3.2 oz (62.7 kg)  10/21/20 139 lb 6.4 oz (63.2 kg)  02/17/20 138 lb (62.6 kg)       Physical Exam Vitals reviewed. Exam conducted with a chaperone present.  Constitutional:      General: She is not in acute distress.    Appearance: She is well-developed.  HENT:     Head: Normocephalic and atraumatic.  Eyes:     General: No scleral icterus.    Conjunctiva/sclera: Conjunctivae normal.  Cardiovascular:     Rate and Rhythm: Normal rate and regular rhythm.     Pulses: Normal pulses.     Heart sounds: Normal heart sounds. No murmur heard.   Pulmonary:     Effort: Pulmonary effort is normal. No respiratory distress.     Breath sounds: Normal breath sounds.  Abdominal:     Palpations: Abdomen is soft.     Tenderness: There is no abdominal tenderness.  Musculoskeletal:     Right lower leg: No edema.     Left lower leg: No edema.  Skin:    General: Skin is warm and dry.     Findings: No rash.  Neurological:     Mental Status: She is alert and oriented to person, place, and time. Mental status is at baseline.  Psychiatric:        Mood and Affect: Mood normal.        Behavior: Behavior normal.        Thought Content: Thought content normal.        Judgment: Judgment normal.       No results found for any visits on 01/18/21.  Assessment & Plan     1. Diabetes mellitus type 2 without retinopathy (HCC) A1c is 6.9.  She is on no medications and at 64 I would leave this with diet and exercise for the  time being. - POCT glycosylated hemoglobin (Hb A1C)  2. Fatigue, unspecified type Multifactorial.  Better than in previous  Year 2. - traZODone (DESYREL) 50 MG tablet; TAKE 1 TABLET BY MOUTH EVERYDAY AT BEDTIME  Dispense: 30 tablet; Refill: 12  3. DDD (degenerative disc disease), lumbar Continue tramadol for the time being. - traMADol (ULTRAM) 50 MG tablet; Take 2 tablets (100 mg total) by mouth every 6 (six) hours as needed.  Dispense: 150 tablet; Refill: 5  4. Irritable bowel syndrome with constipation Continue Linzess.  5. Osteoporosis, unspecified osteoporosis type, unspecified pathological fracture presence   6. Benign essential HTN Controlled on lisinopril and metoprolol twice a day   No follow-ups on file.      I, [name], MD, have reviewed all documentation for this visit. The documentation on 01/23/21 for the exam, diagnosis, procedures, and orders are all accurate and complete.    Rayshawn Visconti Cranford Mon, MD  [facility] [phone] (phone) [phone] (fax)  [place]

## 2021-01-23 MED ORDER — TRAZODONE HCL 50 MG PO TABS: ORAL_TABLET | ORAL | 12 refills | Status: DC

## 2021-01-23 MED ORDER — TRAMADOL HCL 50 MG PO TABS: 100.0000 mg | ORAL_TABLET | Freq: Four times a day (QID) | ORAL | 5 refills | Status: DC | PRN

## 2021-02-22 ENCOUNTER — Other Ambulatory Visit: Payer: Self-pay | Admitting: Family Medicine

## 2021-03-06 DIAGNOSIS — H353221 Exudative age-related macular degeneration, left eye, with active choroidal neovascularization: Secondary | ICD-10-CM | POA: Diagnosis not present

## 2021-03-06 DIAGNOSIS — H353211 Exudative age-related macular degeneration, right eye, with active choroidal neovascularization: Secondary | ICD-10-CM | POA: Diagnosis not present

## 2021-03-06 DIAGNOSIS — E119 Type 2 diabetes mellitus without complications: Secondary | ICD-10-CM | POA: Diagnosis not present

## 2021-03-06 LAB — HM DIABETES EYE EXAM

## 2021-03-15 ENCOUNTER — Encounter: Payer: Self-pay | Admitting: *Deleted

## 2021-04-03 DIAGNOSIS — H353211 Exudative age-related macular degeneration, right eye, with active choroidal neovascularization: Secondary | ICD-10-CM | POA: Diagnosis not present

## 2021-04-12 ENCOUNTER — Other Ambulatory Visit: Payer: Self-pay | Admitting: Family Medicine

## 2021-04-12 NOTE — Telephone Encounter (Signed)
Requested Prescriptions  Pending Prescriptions Disp Refills  . sertraline (ZOLOFT) 25 MG tablet [Pharmacy Med Name: SERTRALINE HCL 25 MG TABLET] 90 tablet 0    Sig: TAKE 1 TABLET BY MOUTH EVERY DAY     Psychiatry:  Antidepressants - SSRI Passed - 04/12/2021  2:58 AM      Passed - Valid encounter within last 6 months    Recent Outpatient Visits          2 months ago Diabetes mellitus type 2 without retinopathy [facility])   [facility] Jerrol Banana., MD   5 months ago Herpes zoster without complication   [place], FNP   1 year ago Benign essential HTN   [facility] Jerrol Banana., MD   1 year ago Chest pain, unspecified type   [facility] Jerrol Banana., MD   1 year ago Chest pain, unspecified type   [facility] Jerrol Banana., MD

## 2021-05-02 DIAGNOSIS — E113213 Type 2 diabetes mellitus with mild nonproliferative diabetic retinopathy with macular edema, bilateral: Secondary | ICD-10-CM | POA: Diagnosis not present

## 2021-05-02 DIAGNOSIS — H353221 Exudative age-related macular degeneration, left eye, with active choroidal neovascularization: Secondary | ICD-10-CM | POA: Diagnosis not present

## 2021-05-02 DIAGNOSIS — H353211 Exudative age-related macular degeneration, right eye, with active choroidal neovascularization: Secondary | ICD-10-CM | POA: Diagnosis not present

## 2021-05-11 ENCOUNTER — Other Ambulatory Visit: Payer: Self-pay | Admitting: Family Medicine

## 2021-05-11 DIAGNOSIS — M25512 Pain in left shoulder: Secondary | ICD-10-CM

## 2021-05-11 DIAGNOSIS — G8929 Other chronic pain: Secondary | ICD-10-CM

## 2021-05-30 ENCOUNTER — Telehealth: Payer: Self-pay | Admitting: Family Medicine

## 2021-05-30 MED ORDER — ISOSORBIDE MONONITRATE ER 30 MG PO TB24: 30.0000 mg | ORAL_TABLET | Freq: Every day | ORAL | 0 refills | Status: DC

## 2021-05-30 MED ORDER — SERTRALINE HCL 25 MG PO TABS: 25.0000 mg | ORAL_TABLET | Freq: Every day | ORAL | 0 refills | Status: DC

## 2021-05-30 NOTE — Telephone Encounter (Signed)
[facility] faxed refill request for the following medications:   simvastatin (ZOCOR) 40 MG tablet   isosorbide mononitrate (IMDUR) 30 MG 24 hr tablet   sertraline (ZOLOFT) 25 MG tablet   Please advise.

## 2021-05-30 NOTE — Telephone Encounter (Signed)
Rx's sent to pharmacy. Simvastatin was discontinued over a year ago.

## 2021-06-01 ENCOUNTER — Other Ambulatory Visit: Payer: Self-pay | Admitting: Family Medicine

## 2021-06-01 NOTE — Telephone Encounter (Signed)
Medication Refill - Medication: simvastatin (ZOCOR) 40 MG tablet  Has the patient contacted their pharmacy? Yes.   (Agent: If no, request that the patient contact the pharmacy for the refill.) (Agent: If yes, when and what did the pharmacy advise?)Pt has a new pharmacy due to [facility] / RX is expired    Preferred Pharmacy (with phone number or street name):  [id number] [place] [place] - [address] Phone:  [phone]  Fax:  [phone]     Has the patient been seen for an appointment in the last year OR does the patient have an upcoming appointment? Yes.    Agent: Please be advised that RX refills may take up to 3 business days. We ask that you follow-up with your pharmacy.

## 2021-06-02 MED ORDER — SIMVASTATIN 40 MG PO TABS: 40.0000 mg | ORAL_TABLET | Freq: Every day | ORAL | 1 refills | Status: DC

## 2021-06-02 NOTE — Telephone Encounter (Signed)
Requested medications are on the active medication list yes  Last visit 01/18/21  Future visit scheduled 07/24/21  Notes to clinic Historical Provider, note states pt is not taking, last valid lab work was 2018, please assess.

## 2021-06-02 NOTE — Telephone Encounter (Signed)
Error in charting, [place], working with IT to fix the issue.

## 2021-06-05 DIAGNOSIS — E113211 Type 2 diabetes mellitus with mild nonproliferative diabetic retinopathy with macular edema, right eye: Secondary | ICD-10-CM | POA: Diagnosis not present

## 2021-06-13 ENCOUNTER — Telehealth: Payer: Self-pay | Admitting: Family Medicine

## 2021-06-13 DIAGNOSIS — K581 Irritable bowel syndrome with constipation: Secondary | ICD-10-CM

## 2021-06-13 MED ORDER — LINACLOTIDE 72 MCG PO CAPS: 72.0000 ug | ORAL_CAPSULE | Freq: Every day | ORAL | 1 refills | Status: DC

## 2021-06-13 NOTE — Telephone Encounter (Signed)
Pharmacy faxed refill request for the following medications:  linaclotide (LINZESS) 72 MCG capsule    Please advise.

## 2021-07-03 DIAGNOSIS — H353221 Exudative age-related macular degeneration, left eye, with active choroidal neovascularization: Secondary | ICD-10-CM | POA: Diagnosis not present

## 2021-07-03 DIAGNOSIS — H353211 Exudative age-related macular degeneration, right eye, with active choroidal neovascularization: Secondary | ICD-10-CM | POA: Diagnosis not present

## 2021-07-24 ENCOUNTER — Ambulatory Visit (INDEPENDENT_AMBULATORY_CARE_PROVIDER_SITE_OTHER): Payer: Medicare Other | Admitting: Family Medicine

## 2021-07-24 ENCOUNTER — Encounter: Payer: Self-pay | Admitting: Family Medicine

## 2021-07-24 ENCOUNTER — Other Ambulatory Visit: Payer: Self-pay

## 2021-07-24 VITALS — BP 142/88 | HR 60 | Ht 63.0 in | Wt 139.0 lb

## 2021-07-24 DIAGNOSIS — M25511 Pain in right shoulder: Secondary | ICD-10-CM

## 2021-07-24 DIAGNOSIS — G8929 Other chronic pain: Secondary | ICD-10-CM

## 2021-07-24 DIAGNOSIS — K219 Gastro-esophageal reflux disease without esophagitis: Secondary | ICD-10-CM

## 2021-07-24 DIAGNOSIS — M25512 Pain in left shoulder: Secondary | ICD-10-CM | POA: Diagnosis not present

## 2021-07-24 DIAGNOSIS — E119 Type 2 diabetes mellitus without complications: Secondary | ICD-10-CM

## 2021-07-24 DIAGNOSIS — I10 Essential (primary) hypertension: Secondary | ICD-10-CM

## 2021-07-24 DIAGNOSIS — K581 Irritable bowel syndrome with constipation: Secondary | ICD-10-CM

## 2021-07-24 DIAGNOSIS — F339 Major depressive disorder, recurrent, unspecified: Secondary | ICD-10-CM | POA: Diagnosis not present

## 2021-07-24 DIAGNOSIS — M810 Age-related osteoporosis without current pathological fracture: Secondary | ICD-10-CM | POA: Diagnosis not present

## 2021-07-24 DIAGNOSIS — E7800 Pure hypercholesterolemia, unspecified: Secondary | ICD-10-CM

## 2021-07-24 DIAGNOSIS — M5136 Other intervertebral disc degeneration, lumbar region: Secondary | ICD-10-CM | POA: Diagnosis not present

## 2021-07-24 DIAGNOSIS — Z23 Encounter for immunization: Secondary | ICD-10-CM

## 2021-07-24 DIAGNOSIS — Z0000 Encounter for general adult medical examination without abnormal findings: Secondary | ICD-10-CM

## 2021-07-24 MED ORDER — GABAPENTIN 300 MG PO CAPS: 300.0000 mg | ORAL_CAPSULE | Freq: Three times a day (TID) | ORAL | 1 refills | Status: DC

## 2021-07-24 MED ORDER — OMEPRAZOLE 20 MG PO CPDR
20.0000 mg | DELAYED_RELEASE_CAPSULE | Freq: Every day | ORAL | 1 refills | Status: DC
Start: 2021-07-24 — End: 2022-01-17

## 2021-07-24 MED ORDER — ENALAPRIL MALEATE 20 MG PO TABS: 20.0000 mg | ORAL_TABLET | Freq: Every day | ORAL | 1 refills | Status: DC

## 2021-07-24 MED ORDER — TRAMADOL HCL 50 MG PO TABS: 100.0000 mg | ORAL_TABLET | Freq: Four times a day (QID) | ORAL | 5 refills | Status: DC | PRN

## 2021-07-24 MED ORDER — LINACLOTIDE 145 MCG PO CAPS: 145.0000 ug | ORAL_CAPSULE | Freq: Every day | ORAL | 5 refills | Status: DC

## 2021-07-24 NOTE — Patient Instructions (Signed)
DOUBLE GABAPENTIN AT HOME.

## 2021-07-24 NOTE — Progress Notes (Signed)
Annual Wellness Visit     Patient: Lynn Wall, Female    DOB: 1927-01-16, 87 y.o.   MRN: [id number] Visit Date: 07/24/2021  Today's Provider: [name], MD   Chief Complaint  Patient presents with   Medicare Wellness    Subjective    Lynn Wall is a 87 y.o. female who presents today for her Annual Wellness Visit. She reports consuming a general diet. Exercise is limited by foot pain, but patient walks outside occasionally with support from cane.  She generally feels fairly well. She reports sleeping fairly well. She does not have additional problems to discuss today.  Her husband is seen in assisted living now with dementia with behavioral disturbance and she is obviously bothered by the him not being at home.  They have been married for over 52 years. Her daughter brings her in today.  She has several issues today. First she has heartburn after eating.  No abdominal pain. He complains of crampy diarrhea at times and the Linzess at 72 mcg is no longer effective. She also complains of discomfort on the bottom of her feet.  It is a cold/tingling/burning discomfort. HPI    Medications: Outpatient Medications Prior to Visit  Medication Sig   aspirin EC 81 MG tablet Take 81 mg by mouth daily.   docusate sodium (STOOL SOFTENER) 100 MG capsule Take 1 capsule (100 mg total) by mouth daily. (Patient taking differently: Take 200 mg by mouth daily.)   isosorbide mononitrate (IMDUR) 30 MG 24 hr tablet Take 1 tablet (30 mg total) by mouth daily.   MELATONIN PO Take 1 tablet by mouth daily.   metoprolol tartrate (LOPRESSOR) 25 MG tablet TAKE 1.5 TABLET TWICE A DAY (Patient taking differently: Take 25 mg by mouth in the morning, at noon, and at bedtime.)   Multiple Vitamins-Minerals (PRESERVISION AREDS 2 PO) Take 1 capsule by mouth daily.   nitroGLYCERIN (NITROSTAT) 0.4 MG SL tablet Place 1 tablet (0.4 mg total) under the tongue every 5 (five) minutes as needed for  chest pain.   ondansetron (ZOFRAN) 4 MG tablet 1/2 to 1 tablet every 6 hours as needed nausea   polyethylene glycol (MIRALAX / GLYCOLAX) packet Take 17 g by mouth daily.   sertraline (ZOLOFT) 25 MG tablet Take 1 tablet (25 mg total) by mouth daily.   simvastatin (ZOCOR) 40 MG tablet Take 1 tablet (40 mg total) by mouth at bedtime.   traZODone (DESYREL) 50 MG tablet TAKE 1 TABLET BY MOUTH EVERYDAY AT BEDTIME   [DISCONTINUED] enalapril (VASOTEC) 20 MG tablet Take 20 mg by mouth daily.   [DISCONTINUED] gabapentin (NEURONTIN) 100 MG capsule TAKE 1 CAPSULE (100 MG TOTAL) BY MOUTH THREE TIMES DAILY.   [DISCONTINUED] linaclotide (LINZESS) 72 MCG capsule Take 1 capsule (72 mcg total) by mouth daily before breakfast.   [DISCONTINUED] omeprazole (PRILOSEC) 20 MG capsule Take 1 capsule (20 mg total) by mouth daily.   [DISCONTINUED] traMADol (ULTRAM) 50 MG tablet Take 2 tablets (100 mg total) by mouth every 6 (six) hours as needed.   [DISCONTINUED] clidinium-chlordiazePOXIDE (LIBRAX) 5-2.5 MG capsule Take 1 capsule by mouth 2 (two) times daily as needed. (Patient not taking: Reported on 03/15/2020)   [DISCONTINUED] dicyclomine (BENTYL) 20 MG tablet Take 1 tablet (20 mg total) by mouth 2 (two) times daily as needed for spasms. (Patient not taking: Reported on 01/18/2021)   [DISCONTINUED] lidocaine-prilocaine (EMLA) cream Apply 1 application topically 3 (three) times daily as needed. Do not apply  to any open areas or blisters. (Patient not taking: Reported on 01/18/2021)   [DISCONTINUED] lisinopril (ZESTRIL) 10 MG tablet TAKE 1 TABLET BY MOUTH EVERY DAY (Patient not taking: No sig reported)   No facility-administered medications prior to visit.    Allergies  Allergen Reactions   Remeron [Mirtazapine] Other (See Comments)    Dizziness and confusion     Patient Care Team: Jerrol Banana., MD as PCP - General (Family Medicine) Vin-Parikh, Deirdre Peer, MD as Referring Physician  (Ophthalmology) Corey Skains, MD as Consulting Physician (Cardiology)  Review of Systems  All other systems reviewed and are negative.       Objective    Vitals: BP (!) 142/88 (BP Location: Right Arm, Patient Position: Sitting, Cuff Size: Normal)   Pulse 60   Ht 5\' 3"  (1.6 m)   Wt 139 lb (63 kg)   LMP  (LMP Unknown)   SpO2 95%   BMI 24.62 kg/m  BP Readings from Last 3 Encounters:  07/24/21 (!) 142/88  01/18/21 (!) 158/72  10/21/20 134/75   Wt Readings from Last 3 Encounters:  07/24/21 139 lb (63 kg)  01/18/21 138 lb 3.2 oz (62.7 kg)  10/21/20 139 lb 6.4 oz (63.2 kg)      Physical Exam Vitals reviewed. Exam conducted with a chaperone present.  Constitutional:      General: She is not in acute distress.    Appearance: She is well-developed.  HENT:     Head: Normocephalic and atraumatic.  Eyes:     General: No scleral icterus.    Conjunctiva/sclera: Conjunctivae normal.  Cardiovascular:     Rate and Rhythm: Normal rate and regular rhythm.     Pulses: Normal pulses.     Heart sounds: Normal heart sounds. No murmur heard. Pulmonary:     Effort: Pulmonary effort is normal. No respiratory distress.     Breath sounds: Normal breath sounds.  Abdominal:     Palpations: Abdomen is soft.     Tenderness: There is no abdominal tenderness.  Musculoskeletal:     Right lower leg: No edema.     Left lower leg: No edema.  Skin:    General: Skin is warm and dry.     Findings: No rash.  Neurological:     Mental Status: She is alert and oriented to person, place, and time. Mental status is at baseline.  Psychiatric:        Mood and Affect: Mood normal.        Behavior: Behavior normal.        Thought Content: Thought content normal.        Judgment: Judgment normal.     Most recent functional status assessment: No flowsheet data found. Most recent fall risk assessment: Fall Risk  03/15/2020  Falls in the past year? 1  Comment -  Number falls in past yr: 0   Injury with Fall? 0  Risk for fall due to : Impaired vision  Follow up Falls prevention discussed    Most recent depression screenings: PHQ 2/9 Scores 03/15/2020 12/14/2019  PHQ - 2 Score 3 3  PHQ- 9 Score 18 13   Most recent cognitive screening: 6CIT Screen 03/15/2020  What Year? 0 points  What month? 0 points  What time? 0 points  Count back from 20 0 points  Months in reverse 4 points  Repeat phrase 10 points  Total Score 14   Most recent Audit-C alcohol use screening Alcohol Use Disorder Test (  AUDIT) 03/15/2020  1. How often do you have a drink containing alcohol? 0  2. How many drinks containing alcohol do you have on a typical day when you are drinking? 0  3. How often do you have six or more drinks on one occasion? 0  AUDIT-C Score 0  Alcohol Brief Interventions/Follow-up AUDIT Score <7 follow-up not indicated   A score of 3 or more in women, and 4 or more in men indicates increased risk for alcohol abuse, EXCEPT if all of the points are from question 1   No results found for any visits on 07/24/21.  Assessment & Plan     Annual wellness visit done today including the all of the following: Reviewed patient's Family Medical History Reviewed and updated list of patient's medical providers Assessment of cognitive impairment was done Assessed patient's functional ability Established a written schedule for health screening [place] Completed and Reviewed  Exercise Activities and Dietary recommendations  Goals      DIET - INCREASE WATER INTAKE     Recommend increasing water intake to 6 glasses a day.        Immunization History  Administered Date(s) Administered   Influenza, High Dose Seasonal PF 05/23/2015, 08/06/2016, 07/23/2017, 07/01/2018   Moderna Sars-Covid-2 Vaccination 11/04/2019, 12/02/2019   PFIZER(Purple Top)SARS-COV-2 Vaccination 09/08/2020   Pneumococcal Conjugate-13 01/10/2017   Pneumococcal Polysaccharide-23 02/10/2018    Tdap 03/27/2012    Health Maintenance  Topic Date Due   Zoster Vaccines- Shingrix (1 of 2) Never done   FOOT EXAM  01/10/2018   DEXA SCAN  11/08/2019   COVID-19 Vaccine (4 - Booster) 11/03/2020   INFLUENZA VACCINE  03/27/2021   HEMOGLOBIN A1C  07/21/2021   OPHTHALMOLOGY EXAM  03/06/2022   TETANUS/TDAP  03/27/2022   Pneumonia Vaccine 1+ Years old  Completed   HPV VACCINES  Aged Out     Discussed health benefits of physical activity, and encouraged her to engage in regular exercise appropriate for her age and condition.    1. Encounter for Medicare annual wellness exam DNR signed and MOST forms sent home with patient and daughter - Lipid panel - TSH - CBC w/Diff/Platelet - Comprehensive Metabolic Panel (CMET) - Hemoglobin A1c  2. Hypercholesteremia On simvastatin 40 - Lipid panel - TSH - CBC w/Diff/Platelet - Comprehensive Metabolic Panel (CMET) - Hemoglobin A1c  3. Benign essential HTN On enalapril 20 metoprolol 25 twice daily - Lipid panel - TSH - CBC w/Diff/Platelet - Comprehensive Metabolic Panel (CMET) - Hemoglobin A1c - enalapril (VASOTEC) 20 MG tablet; Take 1 tablet (20 mg total) by mouth daily.  Dispense: 90 tablet; Refill: 1  4. Diabetes mellitus type 2 without retinopathy ([place]) Diet controlled to this point. - Lipid panel - TSH - CBC w/Diff/Platelet - Comprehensive Metabolic Panel (CMET) - Hemoglobin A1c  5. Age-related osteoporosis without current pathological fracture  - Lipid panel - TSH - CBC w/Diff/Platelet - Comprehensive Metabolic Panel (CMET) - Hemoglobin A1c  6. Gastroesophageal reflux disease without esophagitis Start omeprazole every morning - Lipid panel - TSH - CBC w/Diff/Platelet - Comprehensive Metabolic Panel (CMET) - Hemoglobin A1c - omeprazole (PRILOSEC) 20 MG capsule; Take 1 capsule (20 mg total) by mouth daily.  Dispense: 90 capsule; Refill: 1  7. Depression, recurrent ([place]) Clinically stable on sertraline and  trazodone - Lipid panel - TSH - CBC w/Diff/Platelet - Comprehensive Metabolic Panel (CMET) - Hemoglobin A1c  8. DDD (degenerative disc disease), lumbar  - traMADol (ULTRAM) 50 MG tablet; Take 2  tablets (100 mg total) by mouth every 6 (six) hours as needed.  Dispense: 150 tablet; Refill: 5  9. Chronic pain of both shoulders  - gabapentin (NEURONTIN) 300 MG capsule; Take 1 capsule (300 mg total) by mouth 3 (three) times daily.  Dispense: 270 capsule; Refill: 1  10. Irritable bowel syndrome with constipation Increase Linzess from 72-1 45 daily - linaclotide (LINZESS) 145 MCG CAPS capsule; Take 1 capsule (145 mcg total) by mouth daily before breakfast.  Dispense: 60 capsule; Refill: 5  11. Need for immunization against influenza  - Flu Vaccine QUAD High Dose(Fluad)   Return in about 6 months (around 01/21/2022).    I, [name], MD, have reviewed all documentation for this visit. The documentation on 07/25/21 for the exam, diagnosis, procedures, and orders are all accurate and complete.     Brynda Heick Cranford Mon, MD  [facility] [phone] (phone) [phone] (fax)  [place]

## 2021-07-25 LAB — CBC WITH DIFFERENTIAL/PLATELET
Basophils Absolute: 0 10*3/uL (ref 0.0–0.2)
Basos: 1 %
EOS (ABSOLUTE): 0.1 10*3/uL (ref 0.0–0.4)
Eos: 3 %
Hematocrit: 34.2 % (ref 34.0–46.6)
Hemoglobin: 10.9 g/dL — ABNORMAL LOW (ref 11.1–15.9)
Immature Grans (Abs): 0 10*3/uL (ref 0.0–0.1)
Immature Granulocytes: 0 %
Lymphocytes Absolute: 1.4 10*3/uL (ref 0.7–3.1)
Lymphs: 31 %
MCH: 30 pg (ref 26.6–33.0)
MCHC: 31.9 g/dL (ref 31.5–35.7)
MCV: 94 fL (ref 79–97)
Monocytes Absolute: 0.4 10*3/uL (ref 0.1–0.9)
Monocytes: 8 %
Neutrophils Absolute: 2.5 10*3/uL (ref 1.4–7.0)
Neutrophils: 57 %
Platelets: 128 10*3/uL — ABNORMAL LOW (ref 150–450)
RBC: 3.63 x10E6/uL — ABNORMAL LOW (ref 3.77–5.28)
RDW: 12.9 % (ref 11.7–15.4)
WBC: 4.4 10*3/uL (ref 3.4–10.8)

## 2021-07-25 LAB — COMPREHENSIVE METABOLIC PANEL
ALT: 8 IU/L (ref 0–32)
AST: 20 IU/L (ref 0–40)
Albumin/Globulin Ratio: 2.2 (ref 1.2–2.2)
Albumin: 4.3 g/dL (ref 3.5–4.6)
Alkaline Phosphatase: 55 IU/L (ref 44–121)
BUN/Creatinine Ratio: 22 (ref 12–28)
BUN: 18 mg/dL (ref 10–36)
Bilirubin Total: 0.3 mg/dL (ref 0.0–1.2)
CO2: 24 mmol/L (ref 20–29)
Calcium: 9.7 mg/dL (ref 8.7–10.3)
Chloride: 105 mmol/L (ref 96–106)
Creatinine, Ser: 0.83 mg/dL (ref 0.57–1.00)
Globulin, Total: 2 g/dL (ref 1.5–4.5)
Glucose: 116 mg/dL — ABNORMAL HIGH (ref 70–99)
Potassium: 4.6 mmol/L (ref 3.5–5.2)
Sodium: 139 mmol/L (ref 134–144)
Total Protein: 6.3 g/dL (ref 6.0–8.5)
eGFR: 67 mL/min/{1.73_m2} (ref >59)

## 2021-07-25 LAB — HEMOGLOBIN A1C
Est. average glucose Bld gHb Est-mCnc: 143 mg/dL
Hgb A1c MFr Bld: 6.6 % — ABNORMAL HIGH (ref 4.8–5.6)

## 2021-07-25 LAB — LIPID PANEL
Chol/HDL Ratio: 2.9 ratio (ref 0.0–4.4)
Cholesterol, Total: 144 mg/dL (ref 100–199)
HDL: 50 mg/dL (ref >39)
LDL Chol Calc (NIH): 78 mg/dL (ref 0–99)
Triglycerides: 82 mg/dL (ref 0–149)
VLDL Cholesterol Cal: 16 mg/dL (ref 5–40)

## 2021-07-25 LAB — TSH: TSH: 1.38 u[IU]/mL (ref 0.450–4.500)

## 2021-08-26 ENCOUNTER — Other Ambulatory Visit: Payer: Self-pay | Admitting: Family Medicine

## 2021-08-26 NOTE — Telephone Encounter (Signed)
Requested Prescriptions  Pending Prescriptions Disp Refills   isosorbide mononitrate (IMDUR) 30 MG 24 hr tablet [Pharmacy Med Name: ISOSORBIDE MONONIT ER 30 MG TB] 90 tablet 1    Sig: TAKE 1 TABLET BY MOUTH EVERY DAY     Cardiovascular:  Nitrates Failed - 08/26/2021  2:23 AM      Failed - Last BP in normal range    BP Readings from Last 1 Encounters:  07/24/21 (!) 142/88         Passed - Last Heart Rate in normal range    Pulse Readings from Last 1 Encounters:  07/24/21 60         Passed - Valid encounter within last 12 months    Recent Outpatient Visits          1 month ago Encounter for [employer] annual wellness exam   [facility] Jerrol Banana., MD   7 months ago Diabetes mellitus type 2 without retinopathy [facility])   [facility] Jerrol Banana., MD   10 months ago Herpes zoster without complication   [facility] Flinchum, Kelby Aline, FNP   1 year ago Benign essential HTN   [facility] Jerrol Banana., MD   1 year ago Chest pain, unspecified type   [facility] Jerrol Banana., MD      Future Appointments            In 5 months Jerrol Banana., MD [facility] [place]

## 2021-08-27 DIAGNOSIS — M810 Age-related osteoporosis without current pathological fracture: Secondary | ICD-10-CM | POA: Insufficient documentation

## 2021-09-04 DIAGNOSIS — H353211 Exudative age-related macular degeneration, right eye, with active choroidal neovascularization: Secondary | ICD-10-CM | POA: Diagnosis not present

## 2021-09-23 ENCOUNTER — Other Ambulatory Visit: Payer: Self-pay | Admitting: Family Medicine

## 2021-09-23 NOTE — Telephone Encounter (Signed)
Requested Prescriptions  Pending Prescriptions Disp Refills   sertraline (ZOLOFT) 25 MG tablet [Pharmacy Med Name: SERTRALINE HCL 25 MG TABLET] 90 tablet 1    Sig: TAKE 1 TABLET (25 MG TOTAL) BY MOUTH DAILY.     Psychiatry:  Antidepressants - SSRI Passed - 09/23/2021 12:43 AM      Passed - Valid encounter within last 6 months    Recent Outpatient Visits          2 months ago Encounter for Medicare annual wellness exam   [facility] Jerrol Banana., MD   8 months ago Diabetes mellitus type 2 without retinopathy [facility])   [facility] Jerrol Banana., MD   11 months ago Herpes zoster without complication   [facility] Flinchum, Kelby Aline, FNP   1 year ago Benign essential HTN   [facility] Jerrol Banana., MD   1 year ago Chest pain, unspecified type   [facility] Jerrol Banana., MD      Future Appointments            In 4 months Jerrol Banana., MD [facility] [place]

## 2021-10-30 DIAGNOSIS — H353211 Exudative age-related macular degeneration, right eye, with active choroidal neovascularization: Secondary | ICD-10-CM | POA: Diagnosis not present

## 2021-10-31 DIAGNOSIS — H353221 Exudative age-related macular degeneration, left eye, with active choroidal neovascularization: Secondary | ICD-10-CM | POA: Diagnosis not present

## 2021-10-31 DIAGNOSIS — H353211 Exudative age-related macular degeneration, right eye, with active choroidal neovascularization: Secondary | ICD-10-CM | POA: Diagnosis not present

## 2021-11-30 ENCOUNTER — Other Ambulatory Visit: Payer: Self-pay | Admitting: Family Medicine

## 2021-12-05 DIAGNOSIS — H353221 Exudative age-related macular degeneration, left eye, with active choroidal neovascularization: Secondary | ICD-10-CM | POA: Diagnosis not present

## 2022-01-02 DIAGNOSIS — H353211 Exudative age-related macular degeneration, right eye, with active choroidal neovascularization: Secondary | ICD-10-CM | POA: Diagnosis not present

## 2022-01-03 ENCOUNTER — Ambulatory Visit (INDEPENDENT_AMBULATORY_CARE_PROVIDER_SITE_OTHER): Payer: Medicare Other | Admitting: Family Medicine

## 2022-01-03 ENCOUNTER — Encounter: Payer: Self-pay | Admitting: Family Medicine

## 2022-01-03 VITALS — BP 115/67 | HR 60 | Resp 16 | Ht 63.0 in | Wt 139.0 lb

## 2022-01-03 DIAGNOSIS — I10 Essential (primary) hypertension: Secondary | ICD-10-CM

## 2022-01-03 DIAGNOSIS — R2681 Unsteadiness on feet: Secondary | ICD-10-CM

## 2022-01-03 DIAGNOSIS — M25511 Pain in right shoulder: Secondary | ICD-10-CM | POA: Diagnosis not present

## 2022-01-03 DIAGNOSIS — M5136 Other intervertebral disc degeneration, lumbar region: Secondary | ICD-10-CM

## 2022-01-03 DIAGNOSIS — Z9181 History of falling: Secondary | ICD-10-CM

## 2022-01-03 DIAGNOSIS — E7800 Pure hypercholesterolemia, unspecified: Secondary | ICD-10-CM | POA: Diagnosis not present

## 2022-01-03 DIAGNOSIS — R2689 Other abnormalities of gait and mobility: Secondary | ICD-10-CM | POA: Insufficient documentation

## 2022-01-03 DIAGNOSIS — M25512 Pain in left shoulder: Secondary | ICD-10-CM | POA: Insufficient documentation

## 2022-01-03 DIAGNOSIS — E119 Type 2 diabetes mellitus without complications: Secondary | ICD-10-CM

## 2022-01-03 DIAGNOSIS — G8929 Other chronic pain: Secondary | ICD-10-CM | POA: Diagnosis not present

## 2022-01-03 LAB — POCT GLYCOSYLATED HEMOGLOBIN (HGB A1C)
Est. average glucose Bld gHb Est-mCnc: 148
Hemoglobin A1C: 6.8 % — AB (ref 4.0–5.6)

## 2022-01-03 MED ORDER — GABAPENTIN 400 MG PO CAPS: 400.0000 mg | ORAL_CAPSULE | Freq: Three times a day (TID) | ORAL | 1 refills | Status: DC

## 2022-01-03 MED ORDER — TRAMADOL HCL 50 MG PO TABS: 100.0000 mg | ORAL_TABLET | Freq: Four times a day (QID) | ORAL | 5 refills | Status: DC | PRN

## 2022-01-03 NOTE — Progress Notes (Signed)
?  ? ? ?Established patient visit ? ?I,[name],acting as a scribe for Lynn Durie, MD.,have documented all relevant documentation on the behalf of Lynn Durie, MD,as directed by  Lynn Durie, MD while in the presence of Lynn Durie, MD. ? ? ?Patient: Lynn Wall   DOB: 18-Jul-1927   88 y.o. Female  MRN: [id number] ?Visit Date: 01/03/2022 ? ?Today's healthcare provider: [name], MD  ? ?Chief Complaint  ?Patient presents with  ? Follow-up  ? Diabetes  ? Hyperlipidemia  ? ?Subjective  ?  ?HPI  ?Patient is brought in by her daughter.  She is getting weaker and her gait is more unsteady.  Thinks she is starting to give up because her husband is in a rest home in [place] due to severe Alzheimer's with behavioral disturbance.  She continues to complain of chronic pain.  Gabapentin does help. ?Diabetes Mellitus Type II, follow-up ? ?Lab Results  ?Component Value Date  ? HGBA1C 6.8 (A) 01/03/2022  ? HGBA1C 6.6 (H) 07/24/2021  ? HGBA1C 6.9 (A) 01/18/2021  ? ?Last seen for diabetes 6 months ago.  ?Management since then includes continuing the same treatment. ?Home blood sugar records: fasting range: not checking ?Most Recent Eye Exam: 03/06/2021 ? ?--------------------------------------------------------------------------------------------------- ?Hypertension, follow-up ? ?BP Readings from Last 3 Encounters:  ?01/03/22 115/67  ?07/24/21 (!) 142/88  ?01/18/21 (!) 158/72  ? Wt Readings from Last 3 Encounters:  ?01/03/22 139 lb (63 kg)  ?07/24/21 139 lb (63 kg)  ?01/18/21 138 lb 3.2 oz (62.7 kg)  ?  ? ?She was last seen for hypertension 6 months ago.  ?Management since that visit includes; On enalapril 20 and metoprolol 25 twice daily.  ?Outside blood pressures are not checking. ? ?--------------------------------------------------------------------------------------------------- ?Lipid/Cholesterol, follow-up ? ?Last Lipid Panel: ?Lab Results  ?Component Value Date  ? CHOL 144  07/24/2021  ? [place] 78 07/24/2021  ? HDL 50 07/24/2021  ? TRIG 82 07/24/2021  ? ? ?She was last seen for this 6 months ago.  ?Management since that visit includes; labs checked showing-stable. ? ?Last metabolic panel ?Lab Results  ?Component Value Date  ? GLUCOSE 116 (H) 07/24/2021  ? NA 139 07/24/2021  ? K 4.6 07/24/2021  ? BUN 18 07/24/2021  ? CREATININE 0.83 07/24/2021  ? EGFR 67 07/24/2021  ? GFRNONAA 55 (L) 10/21/2020  ? CALCIUM 9.7 07/24/2021  ? AST 20 07/24/2021  ? ALT 8 07/24/2021  ? ?The ASCVD Risk score (Arnett DK, et al., 2019) failed to calculate for the following reasons: ?  The 2019 ASCVD risk score is only valid for ages 39 to 78 ? ?--------------------------------------------------------------------------------------------------- ? ? ?Medications: ?Outpatient Medications Prior to Visit  ?Medication Sig  ? aspirin EC 81 MG tablet Take 81 mg by mouth daily.  ? docusate sodium (STOOL SOFTENER) 100 MG capsule Take 1 capsule (100 mg total) by mouth daily. (Patient taking differently: Take 200 mg by mouth daily.)  ? enalapril (VASOTEC) 20 MG tablet Take 1 tablet (20 mg total) by mouth daily.  ? linaclotide (LINZESS) 145 MCG CAPS capsule Take 1 capsule (145 mcg total) by mouth daily before breakfast.  ? MELATONIN PO Take 1 tablet by mouth daily.  ? metoprolol tartrate (LOPRESSOR) 25 MG tablet TAKE 1.5 TABLET TWICE A DAY (Patient taking differently: Take 25 mg by mouth in the morning, at noon, and at bedtime.)  ? Multiple Vitamins-Minerals (PRESERVISION AREDS 2 PO) Take 1 capsule by mouth daily.  ? nitroGLYCERIN (NITROSTAT) 0.4 MG SL  tablet Place 1 tablet (0.4 mg total) under the tongue every 5 (five) minutes as needed for chest pain.  ? omeprazole (PRILOSEC) 20 MG capsule Take 1 capsule (20 mg total) by mouth daily.  ? ondansetron (ZOFRAN) 4 MG tablet 1/2 to 1 tablet every 6 hours as needed nausea  ? polyethylene glycol (MIRALAX / GLYCOLAX) packet Take 17 g by mouth daily.  ? sertraline (ZOLOFT) 25 MG  tablet TAKE 1 TABLET (25 MG TOTAL) BY MOUTH DAILY.  ? simvastatin (ZOCOR) 40 MG tablet TAKE 1 TABLET BY MOUTH EVERYDAY AT BEDTIME  ? traZODone (DESYREL) 50 MG tablet TAKE 1 TABLET BY MOUTH EVERYDAY AT BEDTIME  ? [DISCONTINUED] gabapentin (NEURONTIN) 300 MG capsule Take 1 capsule (300 mg total) by mouth 3 (three) times daily.  ? [DISCONTINUED] traMADol (ULTRAM) 50 MG tablet Take 2 tablets (100 mg total) by mouth every 6 (six) hours as needed.  ? isosorbide mononitrate (IMDUR) 30 MG 24 hr tablet TAKE 1 TABLET BY MOUTH EVERY DAY  ? ?No facility-administered medications prior to visit.  ? ? ?Review of Systems  ?Constitutional:  Negative for appetite change, chills, fatigue and fever.  ?Respiratory:  Negative for chest tightness and shortness of breath.   ?Cardiovascular:  Negative for chest pain and palpitations.  ?Gastrointestinal:  Negative for abdominal pain, nausea and vomiting.  ?Neurological:  Negative for dizziness and weakness.  ? ?Last hemoglobin A1c ?Lab Results  ?Component Value Date  ? HGBA1C 6.8 (A) 01/03/2022  ? ?  ?  Objective  ?  ?BP 115/67 (BP Location: Right Arm, Patient Position: Sitting, Cuff Size: Normal)   Pulse 60   Resp 16   Ht _0  (1.6 m)   Wt 139 lb (63 kg)   LMP  (LMP Unknown)   SpO2 94%   BMI 24.62 kg/m?  ?BP Readings from Last 3 Encounters:  ?01/03/22 115/67  ?07/24/21 (!) 142/88  ?01/18/21 (!) 158/72  ? ?Wt Readings from Last 3 Encounters:  ?01/03/22 139 lb (63 kg)  ?07/24/21 139 lb (63 kg)  ?01/18/21 138 lb 3.2 oz (62.7 kg)  ? ?  ? ?Physical Exam ?Vitals reviewed. Exam conducted with a chaperone present.  ?Constitutional:   ?   General: She is not in acute distress. ?   Appearance: She is well-developed.  ?HENT:  ?   Head: Normocephalic and atraumatic.  ?Eyes:  ?   General: No scleral icterus. ?   Conjunctiva/sclera: Conjunctivae normal.  ?Cardiovascular:  ?   Rate and Rhythm: Normal rate and regular rhythm.  ?   Pulses: Normal pulses.  ?   Heart sounds: Normal heart sounds. No  murmur heard. ?Pulmonary:  ?   Effort: Pulmonary effort is normal. No respiratory distress.  ?   Breath sounds: Normal breath sounds.  ?Abdominal:  ?   Palpations: Abdomen is soft.  ?   Tenderness: There is no abdominal tenderness.  ?Musculoskeletal:  ?   Right lower leg: No edema.  ?   Left lower leg: No edema.  ?Skin: ?   General: Skin is warm and dry.  ?   Findings: No rash.  ?Neurological:  ?   Mental Status: She is alert and oriented to person, place, and time. Mental status is at baseline.  ?Psychiatric:     ?   Mood and Affect: Mood normal.     ?   Behavior: Behavior normal.     ?   Thought Content: Thought content normal.     ?  Judgment: Judgment normal.  ?  ? ? ?Results for orders placed or performed in visit on 01/03/22  ?POCT glycosylated hemoglobin (Hb A1C)  ?Result Value Ref Range  ? Hemoglobin A1C 6.8 (A) 4.0 - 5.6 %  ? Est. average glucose Bld gHb Est-mCnc 148   ? ? Assessment & Plan  ?  ? ?1. Diabetes mellitus type 2 without retinopathy ([place]) ? ?- POCT glycosylated hemoglobin (Hb A1C)--6.8 today. ? ?2. Benign essential HTN ?Good blood pressure control. ? ?3. Hypercholesteremia ? ? ?4. Chronic pain of both shoulders ?Chronic pain syndrome.  Increase gabapentin to 300 mg 3 times daily to 400 mg 3 times daily ?- gabapentin (NEURONTIN) 400 MG capsule; Take 1 capsule (400 mg total) by mouth 3 (three) times daily.  Dispense: 270 each; Refill: 1 ? ?5. DDD (degenerative disc disease), lumbar ? ?- traMADol (ULTRAM) 50 MG tablet; Take 2 tablets (100 mg total) by mouth every 6 (six) hours as needed.  Dispense: 150 tablet; Refill: 5 ?- Ambulatory referral to Home Health ? ?6. Unsteady gait ? ?- Ambulatory referral to Home Health ? ?7. At high risk for falls ?Patient and daughter have MOST forms at home.  DNR is filled out at home. ?- Ambulatory referral to Home Health ? ? ?Return in about 4 months (around 05/06/2022).  ?   ? ?I, Lynn Durie, MD, have reviewed all documentation for this visit. The  documentation on 01/09/22 for the exam, diagnosis, procedures, and orders are all accurate and complete. ? ? ? ?Hester Joslin Cranford Mon, MD  ?[facility] [place] ?[phone] (phone) ?[phone] (fax) ? ?[place]

## 2022-01-04 ENCOUNTER — Telehealth: Payer: Self-pay

## 2022-01-04 NOTE — Telephone Encounter (Signed)
Copied from [place] [id number]. Topic: General - Other >> Jan 04, 2022  9:48 AM Parke Poisson wrote: Reason for CRM: Home Health care needs office note completed so they can get auth and begin care for pt,Thanks

## 2022-01-05 ENCOUNTER — Telehealth: Payer: Self-pay | Admitting: Family Medicine

## 2022-01-05 NOTE — Telephone Encounter (Signed)
PA is needed for traMADol (ULTRAM) 50 MG tablet as per pharmacist. Lynn Wall would like a follow up call. Patient is completely out ?

## 2022-01-09 NOTE — Telephone Encounter (Signed)
PA was started. ?

## 2022-01-10 ENCOUNTER — Telehealth: Payer: Self-pay | Admitting: Family Medicine

## 2022-01-10 DIAGNOSIS — M5136 Other intervertebral disc degeneration, lumbar region: Secondary | ICD-10-CM | POA: Diagnosis not present

## 2022-01-10 DIAGNOSIS — K219 Gastro-esophageal reflux disease without esophagitis: Secondary | ICD-10-CM | POA: Diagnosis not present

## 2022-01-10 DIAGNOSIS — Z85828 Personal history of other malignant neoplasm of skin: Secondary | ICD-10-CM | POA: Diagnosis not present

## 2022-01-10 DIAGNOSIS — Z87442 Personal history of urinary calculi: Secondary | ICD-10-CM | POA: Diagnosis not present

## 2022-01-10 DIAGNOSIS — M810 Age-related osteoporosis without current pathological fracture: Secondary | ICD-10-CM | POA: Diagnosis not present

## 2022-01-10 DIAGNOSIS — G8929 Other chronic pain: Secondary | ICD-10-CM | POA: Diagnosis not present

## 2022-01-10 DIAGNOSIS — I10 Essential (primary) hypertension: Secondary | ICD-10-CM | POA: Diagnosis not present

## 2022-01-10 DIAGNOSIS — M25512 Pain in left shoulder: Secondary | ICD-10-CM | POA: Diagnosis not present

## 2022-01-10 DIAGNOSIS — Z7982 Long term (current) use of aspirin: Secondary | ICD-10-CM | POA: Diagnosis not present

## 2022-01-10 DIAGNOSIS — E7800 Pure hypercholesterolemia, unspecified: Secondary | ICD-10-CM | POA: Diagnosis not present

## 2022-01-10 DIAGNOSIS — E119 Type 2 diabetes mellitus without complications: Secondary | ICD-10-CM | POA: Diagnosis not present

## 2022-01-10 DIAGNOSIS — M25511 Pain in right shoulder: Secondary | ICD-10-CM | POA: Diagnosis not present

## 2022-01-10 DIAGNOSIS — R2681 Unsteadiness on feet: Secondary | ICD-10-CM | POA: Diagnosis not present

## 2022-01-10 NOTE — Telephone Encounter (Signed)
Home Health Verbal Orders - Caller/Agency: Laura/ [facility] ?Callback Number: [phone]/ vm can be left ?Requesting PT ?Frequency: 2x's a week for 3 weeks and  ?1x a week for 5 weeks  ?

## 2022-01-11 NOTE — Telephone Encounter (Signed)
Advised 

## 2022-01-17 ENCOUNTER — Other Ambulatory Visit: Payer: Self-pay | Admitting: Family Medicine

## 2022-01-17 DIAGNOSIS — M25512 Pain in left shoulder: Secondary | ICD-10-CM | POA: Diagnosis not present

## 2022-01-17 DIAGNOSIS — G8929 Other chronic pain: Secondary | ICD-10-CM | POA: Diagnosis not present

## 2022-01-17 DIAGNOSIS — I10 Essential (primary) hypertension: Secondary | ICD-10-CM | POA: Diagnosis not present

## 2022-01-17 DIAGNOSIS — M25511 Pain in right shoulder: Secondary | ICD-10-CM | POA: Diagnosis not present

## 2022-01-17 DIAGNOSIS — K219 Gastro-esophageal reflux disease without esophagitis: Secondary | ICD-10-CM

## 2022-01-17 DIAGNOSIS — M5136 Other intervertebral disc degeneration, lumbar region: Secondary | ICD-10-CM | POA: Diagnosis not present

## 2022-01-17 DIAGNOSIS — R2681 Unsteadiness on feet: Secondary | ICD-10-CM | POA: Diagnosis not present

## 2022-01-23 ENCOUNTER — Ambulatory Visit: Payer: Medicare Other | Admitting: Family Medicine

## 2022-01-24 DIAGNOSIS — M25511 Pain in right shoulder: Secondary | ICD-10-CM | POA: Diagnosis not present

## 2022-01-24 DIAGNOSIS — R2681 Unsteadiness on feet: Secondary | ICD-10-CM | POA: Diagnosis not present

## 2022-01-24 DIAGNOSIS — G8929 Other chronic pain: Secondary | ICD-10-CM | POA: Diagnosis not present

## 2022-01-24 DIAGNOSIS — M25512 Pain in left shoulder: Secondary | ICD-10-CM | POA: Diagnosis not present

## 2022-01-24 DIAGNOSIS — M5136 Other intervertebral disc degeneration, lumbar region: Secondary | ICD-10-CM | POA: Diagnosis not present

## 2022-01-24 DIAGNOSIS — I10 Essential (primary) hypertension: Secondary | ICD-10-CM | POA: Diagnosis not present

## 2022-01-25 ENCOUNTER — Other Ambulatory Visit: Payer: Self-pay | Admitting: Family Medicine

## 2022-01-25 DIAGNOSIS — R5383 Other fatigue: Secondary | ICD-10-CM

## 2022-01-30 DIAGNOSIS — H353211 Exudative age-related macular degeneration, right eye, with active choroidal neovascularization: Secondary | ICD-10-CM | POA: Diagnosis not present

## 2022-01-31 DIAGNOSIS — R2681 Unsteadiness on feet: Secondary | ICD-10-CM | POA: Diagnosis not present

## 2022-01-31 DIAGNOSIS — M25512 Pain in left shoulder: Secondary | ICD-10-CM | POA: Diagnosis not present

## 2022-01-31 DIAGNOSIS — M25511 Pain in right shoulder: Secondary | ICD-10-CM | POA: Diagnosis not present

## 2022-01-31 DIAGNOSIS — G8929 Other chronic pain: Secondary | ICD-10-CM | POA: Diagnosis not present

## 2022-01-31 DIAGNOSIS — I10 Essential (primary) hypertension: Secondary | ICD-10-CM | POA: Diagnosis not present

## 2022-01-31 DIAGNOSIS — M5136 Other intervertebral disc degeneration, lumbar region: Secondary | ICD-10-CM | POA: Diagnosis not present

## 2022-02-02 DIAGNOSIS — G8929 Other chronic pain: Secondary | ICD-10-CM | POA: Diagnosis not present

## 2022-02-02 DIAGNOSIS — I10 Essential (primary) hypertension: Secondary | ICD-10-CM | POA: Diagnosis not present

## 2022-02-02 DIAGNOSIS — M25512 Pain in left shoulder: Secondary | ICD-10-CM | POA: Diagnosis not present

## 2022-02-02 DIAGNOSIS — M25511 Pain in right shoulder: Secondary | ICD-10-CM | POA: Diagnosis not present

## 2022-02-02 DIAGNOSIS — M5136 Other intervertebral disc degeneration, lumbar region: Secondary | ICD-10-CM | POA: Diagnosis not present

## 2022-02-02 DIAGNOSIS — R2681 Unsteadiness on feet: Secondary | ICD-10-CM | POA: Diagnosis not present

## 2022-02-05 DIAGNOSIS — M25511 Pain in right shoulder: Secondary | ICD-10-CM | POA: Diagnosis not present

## 2022-02-05 DIAGNOSIS — R2681 Unsteadiness on feet: Secondary | ICD-10-CM | POA: Diagnosis not present

## 2022-02-05 DIAGNOSIS — I10 Essential (primary) hypertension: Secondary | ICD-10-CM | POA: Diagnosis not present

## 2022-02-05 DIAGNOSIS — M5136 Other intervertebral disc degeneration, lumbar region: Secondary | ICD-10-CM | POA: Diagnosis not present

## 2022-02-05 DIAGNOSIS — M25512 Pain in left shoulder: Secondary | ICD-10-CM | POA: Diagnosis not present

## 2022-02-05 DIAGNOSIS — G8929 Other chronic pain: Secondary | ICD-10-CM | POA: Diagnosis not present

## 2022-02-09 DIAGNOSIS — M5136 Other intervertebral disc degeneration, lumbar region: Secondary | ICD-10-CM | POA: Diagnosis not present

## 2022-02-09 DIAGNOSIS — I10 Essential (primary) hypertension: Secondary | ICD-10-CM | POA: Diagnosis not present

## 2022-02-09 DIAGNOSIS — Z7982 Long term (current) use of aspirin: Secondary | ICD-10-CM | POA: Diagnosis not present

## 2022-02-09 DIAGNOSIS — Z87442 Personal history of urinary calculi: Secondary | ICD-10-CM | POA: Diagnosis not present

## 2022-02-09 DIAGNOSIS — Z85828 Personal history of other malignant neoplasm of skin: Secondary | ICD-10-CM | POA: Diagnosis not present

## 2022-02-09 DIAGNOSIS — M810 Age-related osteoporosis without current pathological fracture: Secondary | ICD-10-CM | POA: Diagnosis not present

## 2022-02-09 DIAGNOSIS — E7800 Pure hypercholesterolemia, unspecified: Secondary | ICD-10-CM | POA: Diagnosis not present

## 2022-02-09 DIAGNOSIS — M25512 Pain in left shoulder: Secondary | ICD-10-CM | POA: Diagnosis not present

## 2022-02-09 DIAGNOSIS — K219 Gastro-esophageal reflux disease without esophagitis: Secondary | ICD-10-CM | POA: Diagnosis not present

## 2022-02-09 DIAGNOSIS — R2681 Unsteadiness on feet: Secondary | ICD-10-CM | POA: Diagnosis not present

## 2022-02-09 DIAGNOSIS — M25511 Pain in right shoulder: Secondary | ICD-10-CM | POA: Diagnosis not present

## 2022-02-09 DIAGNOSIS — E119 Type 2 diabetes mellitus without complications: Secondary | ICD-10-CM | POA: Diagnosis not present

## 2022-02-09 DIAGNOSIS — G8929 Other chronic pain: Secondary | ICD-10-CM | POA: Diagnosis not present

## 2022-02-13 DIAGNOSIS — I10 Essential (primary) hypertension: Secondary | ICD-10-CM | POA: Diagnosis not present

## 2022-02-13 DIAGNOSIS — M25512 Pain in left shoulder: Secondary | ICD-10-CM | POA: Diagnosis not present

## 2022-02-13 DIAGNOSIS — M5136 Other intervertebral disc degeneration, lumbar region: Secondary | ICD-10-CM | POA: Diagnosis not present

## 2022-02-13 DIAGNOSIS — R2681 Unsteadiness on feet: Secondary | ICD-10-CM | POA: Diagnosis not present

## 2022-02-13 DIAGNOSIS — M25511 Pain in right shoulder: Secondary | ICD-10-CM | POA: Diagnosis not present

## 2022-02-13 DIAGNOSIS — G8929 Other chronic pain: Secondary | ICD-10-CM | POA: Diagnosis not present

## 2022-02-19 DIAGNOSIS — M25511 Pain in right shoulder: Secondary | ICD-10-CM | POA: Diagnosis not present

## 2022-02-19 DIAGNOSIS — R2681 Unsteadiness on feet: Secondary | ICD-10-CM | POA: Diagnosis not present

## 2022-02-19 DIAGNOSIS — I10 Essential (primary) hypertension: Secondary | ICD-10-CM | POA: Diagnosis not present

## 2022-02-19 DIAGNOSIS — M5136 Other intervertebral disc degeneration, lumbar region: Secondary | ICD-10-CM | POA: Diagnosis not present

## 2022-02-19 DIAGNOSIS — G8929 Other chronic pain: Secondary | ICD-10-CM | POA: Diagnosis not present

## 2022-02-19 DIAGNOSIS — M25512 Pain in left shoulder: Secondary | ICD-10-CM | POA: Diagnosis not present

## 2022-02-20 ENCOUNTER — Other Ambulatory Visit: Payer: Self-pay | Admitting: Family Medicine

## 2022-02-20 DIAGNOSIS — R5383 Other fatigue: Secondary | ICD-10-CM

## 2022-02-20 DIAGNOSIS — I10 Essential (primary) hypertension: Secondary | ICD-10-CM

## 2022-03-01 DIAGNOSIS — M25511 Pain in right shoulder: Secondary | ICD-10-CM | POA: Diagnosis not present

## 2022-03-01 DIAGNOSIS — R2681 Unsteadiness on feet: Secondary | ICD-10-CM | POA: Diagnosis not present

## 2022-03-01 DIAGNOSIS — M25512 Pain in left shoulder: Secondary | ICD-10-CM | POA: Diagnosis not present

## 2022-03-01 DIAGNOSIS — G8929 Other chronic pain: Secondary | ICD-10-CM | POA: Diagnosis not present

## 2022-03-01 DIAGNOSIS — M5136 Other intervertebral disc degeneration, lumbar region: Secondary | ICD-10-CM | POA: Diagnosis not present

## 2022-03-01 DIAGNOSIS — I10 Essential (primary) hypertension: Secondary | ICD-10-CM | POA: Diagnosis not present

## 2022-03-05 DIAGNOSIS — H353211 Exudative age-related macular degeneration, right eye, with active choroidal neovascularization: Secondary | ICD-10-CM | POA: Diagnosis not present

## 2022-04-16 ENCOUNTER — Other Ambulatory Visit: Payer: Self-pay | Admitting: Family Medicine

## 2022-04-16 DIAGNOSIS — H353211 Exudative age-related macular degeneration, right eye, with active choroidal neovascularization: Secondary | ICD-10-CM | POA: Diagnosis not present

## 2022-05-03 ENCOUNTER — Other Ambulatory Visit: Payer: Self-pay | Admitting: Family Medicine

## 2022-05-03 DIAGNOSIS — R5383 Other fatigue: Secondary | ICD-10-CM

## 2022-05-03 NOTE — Telephone Encounter (Signed)
Copied from [place] [phone]. Topic: General - Other >> May 03, 2022 11:43 AM Everette C wrote: Reason for CRM: Medication Refill - Medication: traZODone (DESYREL) 50 MG tablet [[id number]]   Has the patient contacted their pharmacy? Yes.  The patient's daughter has been directed to contact the patient's PCP (Agent: If no, request that the patient contact the pharmacy for the refill. If patient does not wish to contact the pharmacy document the reason why and proceed with request.) (Agent: If yes, when and what did the pharmacy advise?)  Preferred Pharmacy (with phone number or street name): [id number] [place] [id number]: [phone]: [phone]: Not open 24 hours   Has the patient been seen for an appointment in the last year OR does the patient have an upcoming appointment? Yes.    Agent: Please be advised that RX refills may take up to 3 business days. We ask that you follow-up with your pharmacy.

## 2022-05-10 ENCOUNTER — Ambulatory Visit (INDEPENDENT_AMBULATORY_CARE_PROVIDER_SITE_OTHER): Payer: Medicare Other | Admitting: Family Medicine

## 2022-05-10 ENCOUNTER — Encounter: Payer: Self-pay | Admitting: Family Medicine

## 2022-05-10 VITALS — BP 137/69 | HR 68 | Resp 18 | Wt 134.0 lb

## 2022-05-10 DIAGNOSIS — K581 Irritable bowel syndrome with constipation: Secondary | ICD-10-CM | POA: Diagnosis not present

## 2022-05-10 DIAGNOSIS — I10 Essential (primary) hypertension: Secondary | ICD-10-CM | POA: Diagnosis not present

## 2022-05-10 DIAGNOSIS — G3184 Mild cognitive impairment, so stated: Secondary | ICD-10-CM

## 2022-05-10 DIAGNOSIS — H3530 Unspecified macular degeneration: Secondary | ICD-10-CM | POA: Diagnosis not present

## 2022-05-10 DIAGNOSIS — R1084 Generalized abdominal pain: Secondary | ICD-10-CM | POA: Diagnosis not present

## 2022-05-10 DIAGNOSIS — M5136 Other intervertebral disc degeneration, lumbar region: Secondary | ICD-10-CM | POA: Diagnosis not present

## 2022-05-10 DIAGNOSIS — E7800 Pure hypercholesterolemia, unspecified: Secondary | ICD-10-CM | POA: Diagnosis not present

## 2022-05-10 DIAGNOSIS — M810 Age-related osteoporosis without current pathological fracture: Secondary | ICD-10-CM | POA: Diagnosis not present

## 2022-05-10 DIAGNOSIS — Z23 Encounter for immunization: Secondary | ICD-10-CM

## 2022-05-10 DIAGNOSIS — E119 Type 2 diabetes mellitus without complications: Secondary | ICD-10-CM | POA: Diagnosis not present

## 2022-05-10 DIAGNOSIS — I959 Hypotension, unspecified: Secondary | ICD-10-CM | POA: Diagnosis not present

## 2022-05-10 NOTE — Progress Notes (Unsigned)
Established patient visit  I,[name],acting as a scribe for Lynn Durie, MD.,have documented all relevant documentation on the behalf of Lynn Durie, MD,as directed by  Lynn Durie, MD while in the presence of Lynn Durie, MD.   Patient: Lynn Wall   DOB: 1926/12/04   88 y.o. Female  MRN: [id number] Visit Date: 05/10/2022  Today's healthcare provider: [name], MD   Chief Complaint  Patient presents with   Follow-up   Diabetes   Hypertension   Subjective    HPI  Patient brought in by her daughter for follow-up.  Everything is pretty stable.  Her memory is starting to fail.  She does not like living apart from her husband who has significant dementia and lives in a memory care unit in [place].  She continues to have some loose stools and some occasional lightheaded spells.  Had a long talk with the patient and her daughter about long-term goals set 24 regarding her health.  Diabetes Mellitus Type II, follow-up  Lab Results  Component Value Date   HGBA1C 6.8 (A) 01/03/2022   HGBA1C 6.6 (H) 07/24/2021   HGBA1C 6.9 (A) 01/18/2021   Last seen for diabetes 4 months ago.  Management since then includes continuing the same treatment.  Home blood sugar records: fasting range: not checking Most Recent Eye Exam:   --------------------------------------------------------------------------------------------------- Hypertension, follow-up  BP Readings from Last 3 Encounters:  05/10/22 137/69  01/03/22 115/67  07/24/21 (!) 142/88   Wt Readings from Last 3 Encounters:  05/10/22 134 lb (60.8 kg)  01/03/22 139 lb (63 kg)  07/24/21 139 lb (63 kg)     She was last seen for hypertension 4 months ago.  Management since that visit includes; Good blood pressure control.  Outside blood pressures are normal to  low.  ---------------------------------------------------------------------------------------------------   Medications: Outpatient Medications Prior to Visit  Medication Sig   aspirin EC 81 MG tablet Take 81 mg by mouth daily.   docusate sodium (STOOL SOFTENER) 100 MG capsule Take 1 capsule (100 mg total) by mouth daily. (Patient taking differently: Take 200 mg by mouth daily.)   enalapril (VASOTEC) 20 MG tablet TAKE 1 TABLET BY MOUTH EVERY DAY   gabapentin (NEURONTIN) 400 MG capsule Take 1 capsule (400 mg total) by mouth 3 (three) times daily.   isosorbide mononitrate (IMDUR) 30 MG 24 hr tablet TAKE 1 TABLET BY MOUTH EVERY DAY   linaclotide (LINZESS) 145 MCG CAPS capsule Take 1 capsule (145 mcg total) by mouth daily before breakfast.   MELATONIN PO Take 1 tablet by mouth daily.   metoprolol tartrate (LOPRESSOR) 25 MG tablet Take 1 tablet (25 mg total) by mouth in the morning, at noon, and at bedtime.   Multiple Vitamins-Minerals (PRESERVISION AREDS 2 PO) Take 1 capsule by mouth daily.   nitroGLYCERIN (NITROSTAT) 0.4 MG SL tablet Place 1 tablet (0.4 mg total) under the tongue every 5 (five) minutes as needed for chest pain.   omeprazole (PRILOSEC) 20 MG capsule TAKE 1 CAPSULE BY MOUTH EVERY DAY   ondansetron (ZOFRAN) 4 MG tablet 1/2 to 1 tablet every 6 hours as needed nausea   polyethylene glycol (MIRALAX / GLYCOLAX) packet Take 17 g by mouth daily.   sertraline (ZOLOFT) 25 MG tablet TAKE 1 TABLET (25 MG TOTAL) BY MOUTH DAILY.   simvastatin (ZOCOR) 40 MG tablet TAKE 1 TABLET BY MOUTH EVERYDAY AT BEDTIME   traMADol (ULTRAM) 50 MG tablet Take 2 tablets (100  mg total) by mouth every 6 (six) hours as needed.   traZODone (DESYREL) 50 MG tablet TAKE 1 TABLET BY MOUTH EVERYDAY AT BEDTIME   No facility-administered medications prior to visit.    Review of Systems  Constitutional:  Negative for appetite change, chills, fatigue and fever.  Respiratory:  Negative for chest tightness and  shortness of breath.   Cardiovascular:  Negative for chest pain and palpitations.  Gastrointestinal:  Negative for abdominal pain, nausea and vomiting.  Neurological:  Negative for dizziness and weakness.    Last hemoglobin A1c Lab Results  Component Value Date   HGBA1C 6.9 (H) 05/10/2022       Objective    BP 137/69 (BP Location: Left Arm, Patient Position: Sitting, Cuff Size: Normal)   Pulse 68   Resp 18   Wt 134 lb (60.8 kg)   LMP  (LMP Unknown)   SpO2 99%   BMI 23.74 kg/m  BP Readings from Last 3 Encounters:  05/10/22 137/69  01/03/22 115/67  07/24/21 (!) 142/88   Wt Readings from Last 3 Encounters:  05/10/22 134 lb (60.8 kg)  01/03/22 139 lb (63 kg)  07/24/21 139 lb (63 kg)      Physical Exam Vitals reviewed.         05/10/2022    4:58 PM  MMSE - Mini Mental State Exam  Orientation to time 5  Orientation to Place 2  Registration 3  Attention/ Calculation 1  Recall 2  Language- name 2 objects 2  Language- repeat 0  Language- follow 3 step command 2  Language- read & follow direction 1  Write a sentence 0  Copy design 0  Total score 18    No results found for any visits on 05/10/22.  Assessment & Plan     1. Diabetes mellitus type 2 without retinopathy ([place]) Goal A1c less than 8.5-34 in this 88 year old and she is 6.8 on her last check.  Continue to just work on lifestyle. - Hemoglobin A1c - CBC w/Diff/Platelet - Comprehensive Metabolic Panel (CMET) - Lipase - TSH  2. MCI (mild cognitive impairment) This is progressive dementia with an MMSE of 18/30 today.  This is without behavioral disturbance. She continues to live at home and her daughter looks after her. - Hemoglobin A1c - CBC w/Diff/Platelet - Comprehensive Metabolic Panel (CMET) - Lipase - TSH  3. Benign essential HTN Enalapril dose in half. - Hemoglobin A1c - CBC w/Diff/Platelet - Comprehensive Metabolic Panel (CMET) - Lipase - TSH  4. Generalized abdominal pain Hold  Linzess and try probiotic twice a day for her occasional loose stool. - Hemoglobin A1c - CBC w/Diff/Platelet - Comprehensive Metabolic Panel (CMET) - Lipase - TSH  5. Hypercholesteremia Stop simvastatin 88 years of age - Hemoglobin A1c - CBC w/Diff/Platelet - Comprehensive Metabolic Panel (CMET) - Lipase - TSH  6. DDD (degenerative disc disease), lumbar  - Hemoglobin A1c - CBC w/Diff/Platelet - Comprehensive Metabolic Panel (CMET) - Lipase - TSH  7. Irritable bowel syndrome with constipation Stop Linzess and try probiotic - Hemoglobin A1c - CBC w/Diff/Platelet - Comprehensive Metabolic Panel (CMET) - Lipase - TSH  8. Macular degeneration, unspecified laterality, unspecified type Becoming a major issue for her quality of life - Hemoglobin A1c - CBC w/Diff/Platelet - Comprehensive Metabolic Panel (CMET) - Lipase - TSH  9. Hypotension, unspecified hypotension type Cut enalapril in half - Hemoglobin A1c - CBC w/Diff/Platelet - Comprehensive Metabolic Panel (CMET) - Lipase - TSH  10. Age-related osteoporosis without current pathological  fracture    No follow-ups on file.      I, Lynn Durie, MD, have reviewed all documentation for this visit. The documentation on 05/14/22 for the exam, diagnosis, procedures, and orders are all accurate and complete.    Phat Dalton Cranford Mon, MD  [facility] [phone] (phone) [phone] (fax)  [place]

## 2022-05-10 NOTE — Patient Instructions (Addendum)
TRY PROBIOTIC TWICE A DAY. HOLD LINZESS. CUT ENALAPRIL IN HALF. STOP SIMVASTATIN.

## 2022-05-11 ENCOUNTER — Other Ambulatory Visit: Payer: Self-pay | Admitting: Family Medicine

## 2022-05-11 DIAGNOSIS — I10 Essential (primary) hypertension: Secondary | ICD-10-CM

## 2022-05-11 LAB — CBC WITH DIFFERENTIAL/PLATELET
Basophils Absolute: 0 10*3/uL (ref 0.0–0.2)
Basos: 1 %
EOS (ABSOLUTE): 0.1 10*3/uL (ref 0.0–0.4)
Eos: 2 %
Hematocrit: 32.5 % — ABNORMAL LOW (ref 34.0–46.6)
Hemoglobin: 10.7 g/dL — ABNORMAL LOW (ref 11.1–15.9)
Immature Grans (Abs): 0 10*3/uL (ref 0.0–0.1)
Immature Granulocytes: 0 %
Lymphocytes Absolute: 1.6 10*3/uL (ref 0.7–3.1)
Lymphs: 35 %
MCH: 30.7 pg (ref 26.6–33.0)
MCHC: 32.9 g/dL (ref 31.5–35.7)
MCV: 93 fL (ref 79–97)
Monocytes Absolute: 0.4 10*3/uL (ref 0.1–0.9)
Monocytes: 8 %
Neutrophils Absolute: 2.5 10*3/uL (ref 1.4–7.0)
Neutrophils: 54 %
Platelets: 124 10*3/uL — ABNORMAL LOW (ref 150–450)
RBC: 3.49 x10E6/uL — ABNORMAL LOW (ref 3.77–5.28)
RDW: 13 % (ref 11.7–15.4)
WBC: 4.7 10*3/uL (ref 3.4–10.8)

## 2022-05-11 LAB — COMPREHENSIVE METABOLIC PANEL
ALT: 8 IU/L (ref 0–32)
AST: 18 IU/L (ref 0–40)
Albumin/Globulin Ratio: 2.2 (ref 1.2–2.2)
Albumin: 4.3 g/dL (ref 3.6–4.6)
Alkaline Phosphatase: 56 IU/L (ref 44–121)
BUN/Creatinine Ratio: 15 (ref 12–28)
BUN: 13 mg/dL (ref 10–36)
Bilirubin Total: 0.4 mg/dL (ref 0.0–1.2)
CO2: 25 mmol/L (ref 20–29)
Calcium: 9.6 mg/dL (ref 8.7–10.3)
Chloride: 106 mmol/L (ref 96–106)
Creatinine, Ser: 0.86 mg/dL (ref 0.57–1.00)
Globulin, Total: 2 g/dL (ref 1.5–4.5)
Glucose: 134 mg/dL — ABNORMAL HIGH (ref 70–99)
Potassium: 4.4 mmol/L (ref 3.5–5.2)
Sodium: 144 mmol/L (ref 134–144)
Total Protein: 6.3 g/dL (ref 6.0–8.5)
eGFR: 63 mL/min/{1.73_m2} (ref >59)

## 2022-05-11 LAB — LIPASE: Lipase: 25 U/L (ref 14–85)

## 2022-05-11 LAB — HEMOGLOBIN A1C
Est. average glucose Bld gHb Est-mCnc: 151 mg/dL
Hgb A1c MFr Bld: 6.9 % — ABNORMAL HIGH (ref 4.8–5.6)

## 2022-05-11 LAB — TSH: TSH: 0.785 u[IU]/mL (ref 0.450–4.500)

## 2022-05-14 NOTE — Telephone Encounter (Signed)
Requested Prescriptions  Pending Prescriptions Disp Refills  . sertraline (ZOLOFT) 25 MG tablet [Pharmacy Med Name: SERTRALINE HCL 25 MG TABLET] 90 tablet 1    Sig: TAKE 1 TABLET (25 MG TOTAL) BY MOUTH DAILY.     Psychiatry:  Antidepressants - SSRI - sertraline Passed - 05/11/2022  5:48 PM      Passed - AST in normal range and within 360 days    AST  Date Value Ref Range Status  05/10/2022 18 0 - 40 IU/L Final         Passed - ALT in normal range and within 360 days    ALT  Date Value Ref Range Status  05/10/2022 8 0 - 32 IU/L Final         Passed - Completed PHQ-2 or PHQ-9 in the last 360 days      Passed - Valid encounter within last 6 months    Recent Outpatient Visits          4 days ago Diabetes mellitus type 2 without retinopathy [facility])   [facility] Jerrol Banana., MD   4 months ago Diabetes mellitus type 2 without retinopathy [facility])   [facility] Jerrol Banana., MD   9 months ago Encounter for [employer] annual wellness exam   [facility] Jerrol Banana., MD   1 year ago Diabetes mellitus type 2 without retinopathy [facility])   [facility] Jerrol Banana., MD   1 year ago Herpes zoster without complication   [place], Kelby Aline, FNP             . enalapril (VASOTEC) 20 MG tablet [Pharmacy Med Name: ENALAPRIL MALEATE 20 MG TAB] 90 tablet 1    Sig: TAKE 1 TABLET BY MOUTH EVERY DAY     Cardiovascular:  ACE Inhibitors Passed - 05/11/2022  5:48 PM      Passed - Cr in normal range and within 180 days    Creatinine, Ser  Date Value Ref Range Status  05/10/2022 0.86 0.57 - 1.00 mg/dL Final         Passed - K in normal range and within 180 days    Potassium  Date Value Ref Range Status  05/10/2022 4.4 3.5 - 5.2 mmol/L Final         Passed - Patient is not pregnant      Passed - Last BP in normal range    BP Readings from Last 1 Encounters:  05/10/22  137/69         Passed - Valid encounter within last 6 months    Recent Outpatient Visits          4 days ago Diabetes mellitus type 2 without retinopathy [facility])   [facility] Jerrol Banana., MD   4 months ago Diabetes mellitus type 2 without retinopathy [facility])   [facility] Jerrol Banana., MD   9 months ago Encounter for [employer] annual wellness exam   [facility] Jerrol Banana., MD   1 year ago Diabetes mellitus type 2 without retinopathy [facility])   [facility] Jerrol Banana., MD   1 year ago Herpes zoster without complication   [facility] Flinchum, Kelby Aline, FNP

## 2022-05-14 NOTE — Telephone Encounter (Signed)
Requested Prescriptions  Pending Prescriptions Disp Refills  . sertraline (ZOLOFT) 25 MG tablet [Pharmacy Med Name: SERTRALINE HCL 25 MG TABLET] 90 tablet 1    Sig: TAKE 1 TABLET (25 MG TOTAL) BY MOUTH DAILY.     Psychiatry:  Antidepressants - SSRI - sertraline Passed - 05/11/2022  5:48 PM      Passed - AST in normal range and within 360 days    AST  Date Value Ref Range Status  05/10/2022 18 0 - 40 IU/L Final         Passed - ALT in normal range and within 360 days    ALT  Date Value Ref Range Status  05/10/2022 8 0 - 32 IU/L Final         Passed - Completed PHQ-2 or PHQ-9 in the last 360 days      Passed - Valid encounter within last 6 months    Recent Outpatient Visits          4 days ago Diabetes mellitus type 2 without retinopathy [facility])   [facility] Jerrol Banana., MD   4 months ago Diabetes mellitus type 2 without retinopathy [facility])   [facility] Jerrol Banana., MD   9 months ago Encounter for [employer] annual wellness exam   [facility] Jerrol Banana., MD   1 year ago Diabetes mellitus type 2 without retinopathy [facility])   [facility] Jerrol Banana., MD   1 year ago Herpes zoster without complication   [place], Kelby Aline, FNP             . enalapril (VASOTEC) 20 MG tablet [Pharmacy Med Name: ENALAPRIL MALEATE 20 MG TAB] 90 tablet 1    Sig: TAKE 1 TABLET BY MOUTH EVERY DAY     Cardiovascular:  ACE Inhibitors Passed - 05/11/2022  5:48 PM      Passed - Cr in normal range and within 180 days    Creatinine, Ser  Date Value Ref Range Status  05/10/2022 0.86 0.57 - 1.00 mg/dL Final         Passed - K in normal range and within 180 days    Potassium  Date Value Ref Range Status  05/10/2022 4.4 3.5 - 5.2 mmol/L Final         Passed - Patient is not pregnant      Passed - Last BP in normal range    BP Readings from Last 1 Encounters:  05/10/22  137/69         Passed - Valid encounter within last 6 months    Recent Outpatient Visits          4 days ago Diabetes mellitus type 2 without retinopathy [facility])   [facility] Jerrol Banana., MD   4 months ago Diabetes mellitus type 2 without retinopathy [facility])   [facility] Jerrol Banana., MD   9 months ago Encounter for [employer] annual wellness exam   [facility] Jerrol Banana., MD   1 year ago Diabetes mellitus type 2 without retinopathy [facility])   [facility] Jerrol Banana., MD   1 year ago Herpes zoster without complication   [facility] Flinchum, Kelby Aline, FNP

## 2022-05-15 ENCOUNTER — Other Ambulatory Visit: Payer: Self-pay | Admitting: Family Medicine

## 2022-05-15 DIAGNOSIS — G8929 Other chronic pain: Secondary | ICD-10-CM

## 2022-05-15 NOTE — Telephone Encounter (Signed)
Requested by interface surescripts. Medication dose changed. 300 mg dose discontinued on 01/03/22.  Requested Prescriptions  Refused Prescriptions Disp Refills  . gabapentin (NEURONTIN) 300 MG capsule [Pharmacy Med Name: GABAPENTIN 300 MG CAPSULE] 270 capsule 1    Sig: TAKE 1 CAPSULE BY MOUTH THREE TIMES A DAY     Neurology: Anticonvulsants - gabapentin Passed - 05/15/2022 10:35 AM      Passed - Cr in normal range and within 360 days    Creatinine, Ser  Date Value Ref Range Status  05/10/2022 0.86 0.57 - 1.00 mg/dL Final         Passed - Completed PHQ-2 or PHQ-9 in the last 360 days      Passed - Valid encounter within last 12 months    Recent Outpatient Visits          5 days ago Diabetes mellitus type 2 without retinopathy [facility])   [facility] Jerrol Banana., MD   4 months ago Diabetes mellitus type 2 without retinopathy [facility])   [facility] Jerrol Banana., MD   9 months ago Encounter for [employer] annual wellness exam   [facility] Jerrol Banana., MD   1 year ago Diabetes mellitus type 2 without retinopathy [facility])   [facility] Jerrol Banana., MD   1 year ago Herpes zoster without complication   [facility] Flinchum, Kelby Aline, FNP

## 2022-05-28 DIAGNOSIS — H353221 Exudative age-related macular degeneration, left eye, with active choroidal neovascularization: Secondary | ICD-10-CM | POA: Diagnosis not present

## 2022-05-28 DIAGNOSIS — H353211 Exudative age-related macular degeneration, right eye, with active choroidal neovascularization: Secondary | ICD-10-CM | POA: Diagnosis not present

## 2022-05-28 LAB — HM DIABETES EYE EXAM

## 2022-05-30 ENCOUNTER — Other Ambulatory Visit: Payer: Self-pay | Admitting: Family Medicine

## 2022-05-30 DIAGNOSIS — G8929 Other chronic pain: Secondary | ICD-10-CM

## 2022-05-30 NOTE — Telephone Encounter (Signed)
rx dose was changed on 01/03/22.  Requested Prescriptions  Pending Prescriptions Disp Refills  . gabapentin (NEURONTIN) 300 MG capsule [Pharmacy Med Name: GABAPENTIN 300 MG CAPSULE] 270 capsule 1    Sig: TAKE 1 CAPSULE BY MOUTH THREE TIMES A DAY     Neurology: Anticonvulsants - gabapentin Passed - 05/30/2022  2:16 PM      Passed - Cr in normal range and within 360 days    Creatinine, Ser  Date Value Ref Range Status  05/10/2022 0.86 0.57 - 1.00 mg/dL Final         Passed - Completed PHQ-2 or PHQ-9 in the last 360 days      Passed - Valid encounter within last 12 months    Recent Outpatient Visits          2 weeks ago Diabetes mellitus type 2 without retinopathy [facility])   [facility] Jerrol Banana., MD   4 months ago Diabetes mellitus type 2 without retinopathy [facility])   [facility] Jerrol Banana., MD   10 months ago Encounter for [employer] annual wellness exam   [facility] Jerrol Banana., MD   1 year ago Diabetes mellitus type 2 without retinopathy [facility])   [facility] Jerrol Banana., MD   1 year ago Herpes zoster without complication   [facility] Flinchum, Kelby Aline, FNP

## 2022-06-01 ENCOUNTER — Other Ambulatory Visit: Payer: Self-pay | Admitting: Family Medicine

## 2022-06-01 DIAGNOSIS — G8929 Other chronic pain: Secondary | ICD-10-CM

## 2022-06-04 ENCOUNTER — Telehealth: Payer: Self-pay | Admitting: Family Medicine

## 2022-06-04 DIAGNOSIS — G8929 Other chronic pain: Secondary | ICD-10-CM

## 2022-06-04 MED ORDER — GABAPENTIN 300 MG PO CAPS: 300.0000 mg | ORAL_CAPSULE | Freq: Three times a day (TID) | ORAL | 0 refills | Status: DC

## 2022-06-04 NOTE — Telephone Encounter (Signed)
Daughter is requesting refills for mother, Lynn Wall, for Neurontin 300 mg.  This request was sent originally on 05/15/22 but nothing has been done.   Can you refill this today?  [facility] [place].

## 2022-06-04 NOTE — Telephone Encounter (Signed)
Requested medication (s) are due for refill today: no  Requested medication (s) are on the active medication list: no    Last refill: 07/24/21  #270  1 refill  Future visit scheduled no  Notes to clinic: LRF by Dr. Rosanna Randy. Not on current med profile.  Requested Prescriptions  Pending Prescriptions Disp Refills   gabapentin (NEURONTIN) 300 MG capsule [Pharmacy Med Name: GABAPENTIN 300 MG CAPSULE] 270 capsule 1    Sig: TAKE 1 CAPSULE BY MOUTH THREE TIMES A DAY     Neurology: Anticonvulsants - gabapentin Passed - 06/01/2022  6:28 PM      Passed - Cr in normal range and within 360 days    Creatinine, Ser  Date Value Ref Range Status  05/10/2022 0.86 0.57 - 1.00 mg/dL Final         Passed - Completed PHQ-2 or PHQ-9 in the last 360 days      Passed - Valid encounter within last 12 months    Recent Outpatient Visits           3 weeks ago Diabetes mellitus type 2 without retinopathy [facility])   [facility] Jerrol Banana., MD   5 months ago Diabetes mellitus type 2 without retinopathy [facility])   [facility] Jerrol Banana., MD   10 months ago Encounter for [employer] annual wellness exam   [facility] Jerrol Banana., MD   1 year ago Diabetes mellitus type 2 without retinopathy [facility])   [facility] Jerrol Banana., MD   1 year ago Herpes zoster without complication   [facility] Flinchum, Kelby Aline, FNP

## 2022-06-04 NOTE — Telephone Encounter (Signed)
Given patient's documented hx of progressive MCI and that I have not previously prescribed this medication for her, I would feel more comfortable meeting the patient and her daughter prior to prescribing 90 day supply for this medication to discuss efficacy, presence of any side effects and discuss potential SE in relation to her   co-morbidities.   Will supply 1 month's worth to allow for time to schedule an appointment.   [name], MD  [facility]  [phone]

## 2022-06-04 NOTE — Addendum Note (Signed)
Addended by: Adolph Pollack on: 06/04/2022 10:33 AM   Modules accepted: Orders

## 2022-06-07 NOTE — Telephone Encounter (Signed)
LMTCB to Schedule appt. [place] for [facility] Nurse to give patient providers message.

## 2022-07-04 ENCOUNTER — Ambulatory Visit: Payer: Medicare Other | Admitting: Family Medicine

## 2022-07-04 NOTE — Progress Notes (Deleted)
Established patient visit   Patient: Lynn Wall   DOB: [birth year]   88 y.o. Female  MRN: [id number] Visit Date: 07/04/2022  Today's healthcare provider: [name], MD   No chief complaint on file.  Subjective    HPI   Medication Management for Chronic Controlled Substances  Patient presents for medication management  She presents with her daughter to discuss ***  Patient takes Tramadol and gabapentin for ***  She has been taking this dose since   The medications help with chronic pain ***  She takes this medication ***daily  Potential side effects include physical dependence, dizziness, HA, constipation, respiratory depression, worsened sleep apnea, seizures in patients with seizure disorders  SE specific to patients 65 years and older include ataxia, impaired psychomotor function, syncope and falls   Medications: Outpatient Medications Prior to Visit  Medication Sig  . aspirin EC 81 MG tablet Take 81 mg by mouth daily.  Marland Kitchen docusate sodium (STOOL SOFTENER) 100 MG capsule Take 1 capsule (100 mg total) by mouth daily. (Patient taking differently: Take 200 mg by mouth daily.)  . enalapril (VASOTEC) 20 MG tablet TAKE 1 TABLET BY MOUTH EVERY DAY  . gabapentin (NEURONTIN) 300 MG capsule Take 1 capsule (300 mg total) by mouth 3 (three) times daily.  . isosorbide mononitrate (IMDUR) 30 MG 24 hr tablet TAKE 1 TABLET BY MOUTH EVERY DAY  . linaclotide (LINZESS) 145 MCG CAPS capsule Take 1 capsule (145 mcg total) by mouth daily before breakfast.  . MELATONIN PO Take 1 tablet by mouth daily.  . metoprolol tartrate (LOPRESSOR) 25 MG tablet Take 1 tablet (25 mg total) by mouth in the morning, at noon, and at bedtime.  . Multiple Vitamins-Minerals (PRESERVISION AREDS 2 PO) Take 1 capsule by mouth daily.  . nitroGLYCERIN (NITROSTAT) 0.4 MG SL tablet Place 1 tablet (0.4 mg total) under the tongue every 5 (five) minutes as needed for chest pain.  Marland Kitchen omeprazole  (PRILOSEC) 20 MG capsule TAKE 1 CAPSULE BY MOUTH EVERY DAY  . ondansetron (ZOFRAN) 4 MG tablet 1/2 to 1 tablet every 6 hours as needed nausea  . polyethylene glycol (MIRALAX / GLYCOLAX) packet Take 17 g by mouth daily.  . sertraline (ZOLOFT) 25 MG tablet TAKE 1 TABLET (25 MG TOTAL) BY MOUTH DAILY.  . simvastatin (ZOCOR) 40 MG tablet TAKE 1 TABLET BY MOUTH EVERYDAY AT BEDTIME  . traMADol (ULTRAM) 50 MG tablet Take 2 tablets (100 mg total) by mouth every 6 (six) hours as needed.  . traZODone (DESYREL) 50 MG tablet TAKE 1 TABLET BY MOUTH EVERYDAY AT BEDTIME   No facility-administered medications prior to visit.    Review of Systems  Constitutional:  Negative for appetite change, chills, fatigue and fever.  Respiratory:  Negative for chest tightness and shortness of breath.   Cardiovascular:  Negative for chest pain and palpitations.  Gastrointestinal:  Negative for abdominal pain, nausea and vomiting.  Neurological:  Negative for dizziness and weakness.   {Labs  Heme  Chem  Endocrine  Serology  Results Review (optional):23779}   Objective    LMP  (LMP Unknown)  {Show previous vital signs (optional):23777}  Physical Exam  ***  No results found for any visits on 07/04/22.  Assessment & Plan     Problem List Items Addressed This Visit   None    No follow-ups on file.      I, [name], MD, have reviewed all documentation for this visit. The documentation on 07/04/22  for the exam, diagnosis, procedures, and orders are all accurate and complete.  Portions of this information were initially documented by the CMA and reviewed by me for thoroughness and accuracy.      [name], MD  [facility] [phone] (phone) [phone] (fax)  [place]

## 2022-07-05 ENCOUNTER — Ambulatory Visit (INDEPENDENT_AMBULATORY_CARE_PROVIDER_SITE_OTHER): Payer: Medicare Other | Admitting: Family Medicine

## 2022-07-05 ENCOUNTER — Encounter: Payer: Self-pay | Admitting: Family Medicine

## 2022-07-05 VITALS — BP 116/67 | HR 63 | Temp 98.5°F | Resp 16 | Wt 132.5 lb

## 2022-07-05 DIAGNOSIS — F1190 Opioid use, unspecified, uncomplicated: Secondary | ICD-10-CM

## 2022-07-05 DIAGNOSIS — M25551 Pain in right hip: Secondary | ICD-10-CM

## 2022-07-05 DIAGNOSIS — K5903 Drug induced constipation: Secondary | ICD-10-CM

## 2022-07-05 DIAGNOSIS — M5136 Other intervertebral disc degeneration, lumbar region: Secondary | ICD-10-CM

## 2022-07-05 DIAGNOSIS — G8929 Other chronic pain: Secondary | ICD-10-CM

## 2022-07-05 DIAGNOSIS — I471 Supraventricular tachycardia, unspecified: Secondary | ICD-10-CM

## 2022-07-05 DIAGNOSIS — T402X5A Adverse effect of other opioids, initial encounter: Secondary | ICD-10-CM | POA: Insufficient documentation

## 2022-07-05 DIAGNOSIS — K219 Gastro-esophageal reflux disease without esophagitis: Secondary | ICD-10-CM

## 2022-07-05 MED ORDER — OMEPRAZOLE 20 MG PO CPDR: 20.0000 mg | DELAYED_RELEASE_CAPSULE | ORAL | 0 refills | Status: DC | PRN

## 2022-07-05 MED ORDER — TRAMADOL HCL 50 MG PO TABS: 50.0000 mg | ORAL_TABLET | Freq: Two times a day (BID) | ORAL | 0 refills | Status: AC | PRN

## 2022-07-05 MED ORDER — POLYETHYLENE GLYCOL 3350 17 G PO PACK: 17.0000 g | PACK | Freq: Two times a day (BID) | ORAL | 2 refills | Status: AC

## 2022-07-05 MED ORDER — GABAPENTIN 300 MG PO CAPS: 300.0000 mg | ORAL_CAPSULE | Freq: Every day | ORAL | 0 refills | Status: DC

## 2022-07-05 NOTE — Patient Instructions (Addendum)
Today we made some medication adjustments for your hip pain.   #1.  Please start taking 300 mg of gabapentin at bedtime #2.  Please start taking 2 capfuls of MiraLAX daily until bowel movements  -The goal was for you to have 1-2 soft and easy to pass bowel movements per day  -I recommend increasing your water intake to (6) 16-20 ounce bottles of water per day to help with maintain hydration and have easier stools  #3.  You will start taking 50 mg - 100 mg (1-2 tablets) of tramadol every 12 hours as needed.  Please plan to follow-up with me in 1 month

## 2022-07-05 NOTE — Progress Notes (Signed)
I,Lynn Wall,acting as a scribe for [employer], MD.,have documented all relevant documentation on the behalf of Lynn Foster, MD,as directed by  Lynn Foster, MD while in the presence of Lynn Foster, MD.   Established patient visit   Patient: Lynn Wall   DOB: 12-Jun-1927   88 y.o. Female  MRN: [id number] Visit Date: 07/05/2022  Today's healthcare provider: [name], MD   Chief Complaint  Patient presents with   Medication Management   Subjective    HPI   Medication Management for Chronic Controlled Substances  Patient presents for medication management  She presents with her daughter, Lynn Wall, to discuss Gabapentin and Tramadol as needed Patient takes Tramadol and gabapentin for Chronic Pain  She has been taking this dose for years, she reports    There is concern that the tramadol and gabapentin may be making constipation worse  They report that her diet and oral hydration is not sufficient  Patient is afriad to leave the home several days due to concern for prolonged constipation followed by multiple loose and unpredictable stools  Right sided hip pain that radiates to her leg  She and her daughter would like to decrease medication as the patient does not require as much as she did in the past   Patient reports that the medications help with chronic pain due to arthritis   She reports taking the tramadol as needed and usually takes 1 tablet sometimes twice daily, she has not needed it four times per day in a long time She states sometimes not needing it at all for days at at time She reports that she at most has taken two in a day in the most recent weeks    Potential side effects discussed include physical dependence, dizziness, HA, constipation, respiratory depression, worsened sleep apnea, seizures in patients with seizure disorders  SE specific to patients 65 years and older include ataxia,  impaired psychomotor function, syncope and falls   Medications: Outpatient Medications Prior to Visit  Medication Sig   aspirin EC 81 MG tablet Take 81 mg by mouth daily.   docusate sodium (STOOL SOFTENER) 100 MG capsule Take 1 capsule (100 mg total) by mouth daily. (Patient taking differently: Take 200 mg by mouth daily.)   enalapril (VASOTEC) 20 MG tablet TAKE 1 TABLET BY MOUTH EVERY DAY   isosorbide mononitrate (IMDUR) 30 MG 24 hr tablet TAKE 1 TABLET BY MOUTH EVERY DAY   linaclotide (LINZESS) 145 MCG CAPS capsule Take 1 capsule (145 mcg total) by mouth daily before breakfast.   MELATONIN PO Take 1 tablet by mouth daily.   metoprolol tartrate (LOPRESSOR) 25 MG tablet Take 1 tablet (25 mg total) by mouth in the morning, at noon, and at bedtime.   nitroGLYCERIN (NITROSTAT) 0.4 MG SL tablet Place 1 tablet (0.4 mg total) under the tongue every 5 (five) minutes as needed for chest pain.   sertraline (ZOLOFT) 25 MG tablet TAKE 1 TABLET (25 MG TOTAL) BY MOUTH DAILY.   traZODone (DESYREL) 50 MG tablet TAKE 1 TABLET BY MOUTH EVERYDAY AT BEDTIME   [DISCONTINUED] omeprazole (PRILOSEC) 20 MG capsule TAKE 1 CAPSULE BY MOUTH EVERY DAY (Patient taking differently: No sig reported)   [DISCONTINUED] polyethylene glycol (MIRALAX / GLYCOLAX) packet Take 17 g by mouth daily.   [DISCONTINUED] traMADol (ULTRAM) 50 MG tablet Take 2 tablets (100 mg total) by mouth every 6 (six) hours as needed.   [DISCONTINUED] gabapentin (NEURONTIN) 300 MG capsule Take 1 capsule (300  mg total) by mouth 3 (three) times daily.   [DISCONTINUED] Multiple Vitamins-Minerals (PRESERVISION AREDS 2 PO) Take 1 capsule by mouth daily.   [DISCONTINUED] ondansetron (ZOFRAN) 4 MG tablet 1/2 to 1 tablet every 6 hours as needed nausea   [DISCONTINUED] simvastatin (ZOCOR) 40 MG tablet TAKE 1 TABLET BY MOUTH EVERYDAY AT BEDTIME   No facility-administered medications prior to visit.    Review of Systems  Constitutional:  Negative for appetite  change, chills, fatigue and fever.  Respiratory:  Negative for chest tightness and shortness of breath.   Cardiovascular:  Negative for chest pain and palpitations.  Gastrointestinal:  Negative for abdominal pain, nausea and vomiting.  Neurological:  Negative for dizziness and weakness.       Objective    BP 116/67 (BP Location: Left Arm, Patient Position: Sitting, Cuff Size: Normal)   Pulse 63   Temp 98.5 F (36.9 C) (Oral)   Resp 16   Wt 132 lb 8 oz (60.1 kg)   LMP  (LMP Unknown)   BMI 23.47 kg/m    Physical Exam Vitals reviewed.  Constitutional:      General: She is not in acute distress.    Appearance: Normal appearance. She is not ill-appearing, toxic-appearing or diaphoretic.  Eyes:     Conjunctiva/sclera: Conjunctivae normal.  Cardiovascular:     Rate and Rhythm: Normal rate and regular rhythm.     Pulses: Normal pulses.     Heart sounds: Normal heart sounds. No murmur heard.    No friction rub. No gallop.  Pulmonary:     Effort: Pulmonary effort is normal. No respiratory distress.     Breath sounds: Normal breath sounds. No stridor. No wheezing, rhonchi or rales.  Abdominal:     General: Bowel sounds are normal. There is no distension.     Palpations: Abdomen is soft.     Tenderness: There is no abdominal tenderness.  Musculoskeletal:     Lumbar back: Negative right straight leg raise test and negative left straight leg raise test.     Right hip: Tenderness present. Decreased range of motion. Decreased strength.     Left hip: Tenderness present. Decreased range of motion. Decreased strength.     Right lower leg: Normal. No edema.     Left lower leg: Normal. No edema.  Skin:    Findings: No erythema or rash.  Neurological:     Mental Status: She is alert and oriented to person, place, and time.       No results found for any visits on 07/05/22.  Assessment & Plan     Problem List Items Addressed This Visit       Cardiovascular and Mediastinum    Paroxysmal supraventricular tachycardia    Intermittent palpitations similar to previous presentation  Will submit cardiology referral  Patient continues metoprolol '25mg'$  TID        Relevant Orders   Ambulatory referral to Cardiology     Digestive   Acid reflux    Stable  Prescribed refills of omeprazole '20mg'$  daily        Relevant Medications   omeprazole (PRILOSEC) 20 MG capsule   polyethylene glycol (MIRALAX / GLYCOLAX) 17 g packet   Constipation due to opioid therapy    Reviewed dietary hx along with oral hydration regimen and bowel regimen  We had extensive discussion about the effects of long term opioid medications like tramadol and how they can affect the bowel habits Her case is more intricate as she has  hx of other abnormal bowel patterns similar to IBS  Recommended increasing water intake as well as miralax regimen to twice daily  We have also adjusted the opioid medication frequency to reflect how she is actually using the medications, see hip pain problem A/P  She will follow up in 1 month for constipation         Musculoskeletal and Integument   DDD (degenerative disc disease), lumbar    Chronic, stable  This pain is not as bad as the hip pain  Reports that the tramadol and gabapentin help to make the pain manageable       Relevant Medications   traMADol (ULTRAM) 50 MG tablet     Other   Shoulder pain, bilateral - Primary   Relevant Medications   gabapentin (NEURONTIN) 300 MG capsule   Chronic, continuous use of opioids    Chronic, stable Multiple sources of arthritic pain  She will need chronic opioid agreement at next visit  Recommend UDS annually  Patient and her daugther would like to have the patient on the lowest effective dose of these medications given advanced age and our lengthy discussion of risks/benefits of chronic opoid therapy   New regimen will include :  - '300mg'$  gabapentin at bedtime  - 50-'100mg'$  tramadol every 12 hours as needed   She  will follow up in 1 month          Return in about 1 month (around 08/04/2022) for Constipation, medication management.      I, Lynn Foster, MD, have reviewed all documentation for this visit. The documentation on 07/09/22 for the exam, diagnosis, procedures, and orders are all accurate and complete.  Portions of this information were initially documented by the CMA and reviewed by me for thoroughness and accuracy.      Lynn Foster, MD  [facility] [phone] (phone) [phone] (fax)  [place]

## 2022-07-09 NOTE — Assessment & Plan Note (Signed)
Stable  Prescribed refills of omeprazole '20mg'$  daily

## 2022-07-09 NOTE — Assessment & Plan Note (Signed)
Chronic, stable Multiple sources of arthritic pain  She will need chronic opioid agreement at next visit  Recommend UDS annually  Patient and her daugther would like to have the patient on the lowest effective dose of these medications given advanced age and our lengthy discussion of risks/benefits of chronic opoid therapy   New regimen will include :  - '300mg'$  gabapentin at bedtime  - 50-'100mg'$  tramadol every 12 hours as needed   She will follow up in 1 month

## 2022-07-09 NOTE — Assessment & Plan Note (Signed)
Intermittent palpitations similar to previous presentation  Will submit cardiology referral  Patient continues metoprolol '25mg'$  TID

## 2022-07-09 NOTE — Assessment & Plan Note (Signed)
Reviewed dietary hx along with oral hydration regimen and bowel regimen  We had extensive discussion about the effects of long term opioid medications like tramadol and how they can affect the bowel habits Her case is more intricate as she has hx of other abnormal bowel patterns similar to IBS  Recommended increasing water intake as well as miralax regimen to twice daily  We have also adjusted the opioid medication frequency to reflect how she is actually using the medications, see hip pain problem A/P  She will follow up in 1 month for constipation

## 2022-07-09 NOTE — Assessment & Plan Note (Signed)
Chronic, stable  This pain is not as bad as the hip pain  Reports that the tramadol and gabapentin help to make the pain manageable

## 2022-07-16 DIAGNOSIS — H353211 Exudative age-related macular degeneration, right eye, with active choroidal neovascularization: Secondary | ICD-10-CM | POA: Diagnosis not present

## 2022-07-25 DIAGNOSIS — E782 Mixed hyperlipidemia: Secondary | ICD-10-CM | POA: Diagnosis not present

## 2022-07-25 DIAGNOSIS — R42 Dizziness and giddiness: Secondary | ICD-10-CM | POA: Diagnosis not present

## 2022-07-25 DIAGNOSIS — R0602 Shortness of breath: Secondary | ICD-10-CM | POA: Diagnosis not present

## 2022-07-25 DIAGNOSIS — I2089 Other forms of angina pectoris: Secondary | ICD-10-CM | POA: Diagnosis not present

## 2022-07-25 DIAGNOSIS — I10 Essential (primary) hypertension: Secondary | ICD-10-CM | POA: Diagnosis not present

## 2022-07-25 DIAGNOSIS — R002 Palpitations: Secondary | ICD-10-CM | POA: Diagnosis not present

## 2022-08-06 ENCOUNTER — Encounter: Payer: Self-pay | Admitting: Family Medicine

## 2022-08-06 ENCOUNTER — Ambulatory Visit (INDEPENDENT_AMBULATORY_CARE_PROVIDER_SITE_OTHER): Payer: Medicare Other | Admitting: Family Medicine

## 2022-08-06 VITALS — BP 133/86 | HR 96 | Temp 97.7°F | Resp 16 | Wt 131.0 lb

## 2022-08-06 DIAGNOSIS — T402X5A Adverse effect of other opioids, initial encounter: Secondary | ICD-10-CM | POA: Diagnosis not present

## 2022-08-06 DIAGNOSIS — K5903 Drug induced constipation: Secondary | ICD-10-CM | POA: Diagnosis not present

## 2022-08-06 DIAGNOSIS — G8929 Other chronic pain: Secondary | ICD-10-CM

## 2022-08-06 DIAGNOSIS — M25551 Pain in right hip: Secondary | ICD-10-CM

## 2022-08-06 DIAGNOSIS — I10 Essential (primary) hypertension: Secondary | ICD-10-CM

## 2022-08-06 MED ORDER — GABAPENTIN 300 MG PO CAPS: 300.0000 mg | ORAL_CAPSULE | Freq: Every day | ORAL | 1 refills | Status: DC

## 2022-08-06 MED ORDER — SENNOSIDES 8.6 MG PO TABS: 1.0000 | ORAL_TABLET | Freq: Two times a day (BID) | ORAL | 1 refills | Status: DC

## 2022-08-06 MED ORDER — METOPROLOL TARTRATE 25 MG PO TABS: 25.0000 mg | ORAL_TABLET | Freq: Three times a day (TID) | ORAL | 1 refills | Status: DC

## 2022-08-06 MED ORDER — ENALAPRIL MALEATE 20 MG PO TABS: 20.0000 mg | ORAL_TABLET | Freq: Every day | ORAL | 1 refills | Status: DC

## 2022-08-06 MED ORDER — LACTULOSE 10 GM/15ML PO SOLN: 10.0000 g | Freq: Two times a day (BID) | ORAL | 0 refills | Status: DC | PRN

## 2022-08-06 NOTE — Patient Instructions (Addendum)
I have prescribed the Lactulose '10mg'$  (this is liquid solution) to help with bowel movements  In the event that the Senna and miralax do not help to have 1-2 soft bowel movements per day    Please continue your water intake goals   For sleep, you can increase your Trazdone by 1/2 to 1 additional tablet at bed time  We discussed starting the melatonin 1 to 1.5 hours prior to when you would like to set your bed time. For example, if you plan to lie down at 8:30PM, take this at 7:00 PM no later than 7:30 and allow for a full 8-9 hours of sleep    Please follow up in 3-4 weeks for sleep and constipation

## 2022-08-06 NOTE — Progress Notes (Unsigned)
I,[name],acting as a scribe for [employer], MD.,have documented all relevant documentation on the behalf of [name], MD,as directed by  [name], MD while in the presence of [name], MD.   Established patient visit   Patient: Lynn Wall   DOB: [birth year]   88 y.o. Female  MRN: [id number] Visit Date: 08/06/2022  Today's healthcare provider: [name], MD   Chief Complaint  Patient presents with   Follow-Up Constipation   Subjective    HPI   Follow Up, Constipation  Patient was last seen for this problem 1 month ago  At that visit she was recommended to increase water intake as well as miralax regimen to twice daily . Patient reports today still constipated. She takes chewables Dulcolax. Stool still hard and comes out in little pieces. She has been using the dulcolax because it used to work well for her  She has been taking the linzess but this has not been noticed  Patient reports drinking more and more fluids but can still be constipated for up to 1 week  She reports having the feeling of needing to have bowel movements without actually passing stool   Medications: Outpatient Medications Prior to Visit  Medication Sig   aspirin EC 81 MG tablet Take 81 mg by mouth daily.   docusate sodium (STOOL SOFTENER) 100 MG capsule Take 1 capsule (100 mg total) by mouth daily. (Patient taking differently: Take 200 mg by mouth daily.)   isosorbide mononitrate (IMDUR) 30 MG 24 hr tablet TAKE 1 TABLET BY MOUTH EVERY DAY   linaclotide (LINZESS) 145 MCG CAPS capsule Take 1 capsule (145 mcg total) by mouth daily before breakfast.   MELATONIN PO Take 1 tablet by mouth daily.   nitroGLYCERIN (NITROSTAT) 0.4 MG SL tablet Place 1 tablet (0.4 mg total) under the tongue every 5 (five) minutes as needed for chest pain.   omeprazole (PRILOSEC) 20 MG capsule Take 1 capsule (20 mg total) by mouth as needed.    polyethylene glycol (MIRALAX / GLYCOLAX) 17 g packet Take 17 g by mouth 2 (two) times daily.   sertraline (ZOLOFT) 25 MG tablet TAKE 1 TABLET (25 MG TOTAL) BY MOUTH DAILY.   traZODone (DESYREL) 50 MG tablet TAKE 1 TABLET BY MOUTH EVERYDAY AT BEDTIME   [DISCONTINUED] enalapril (VASOTEC) 20 MG tablet TAKE 1 TABLET BY MOUTH EVERY DAY   [DISCONTINUED] metoprolol tartrate (LOPRESSOR) 25 MG tablet Take 1 tablet (25 mg total) by mouth in the morning, at noon, and at bedtime.   [DISCONTINUED] gabapentin (NEURONTIN) 300 MG capsule Take 1 capsule (300 mg total) by mouth at bedtime.   No facility-administered medications prior to visit.    Review of Systems  {Labs  Heme  Chem  Endocrine  Serology  Results Review (optional):23779}   Objective    BP (!) 166/91 (BP Location: Left Arm, Patient Position: Sitting, Cuff Size: Normal)   Pulse 60   Temp 97.7 F (36.5 C) (Oral)   Resp 16   Wt 131 lb (59.4 kg)   LMP  (LMP Unknown)   BMI 23.21 kg/m  {Show previous vital signs (optional):23777}  Physical Exam  ***  No results found for any visits on 08/06/22.  Assessment & Plan     Problem List Items Addressed This Visit       Cardiovascular and Mediastinum   Benign essential HTN   Relevant Medications   enalapril (VASOTEC) 20 MG tablet   metoprolol tartrate (LOPRESSOR) 25  MG tablet   Other Visit Diagnoses     Chronic pain of right hip       Relevant Medications   gabapentin (NEURONTIN) 300 MG capsule        Return in about 3 weeks (around 08/27/2022) for sleep, constipation .      I, [name], MD, have reviewed all documentation for this visit.  Portions of this information were initially documented by the CMA and reviewed by me for thoroughness and accuracy.      [name], MD  [facility] [phone] (phone) [phone] (fax)  [place]

## 2022-08-08 NOTE — Assessment & Plan Note (Signed)
Recommended continued miralax twice daily  Added senna 8.6 daily  Added lactulose '10mg'$  liquid dose if unable to achieve 1-2 soft, easy to pass bowel movements daily with miralax and increased water intake and senna  She has decreased frequency of tramadol dosing to PRN for severe pain

## 2022-08-08 NOTE — Assessment & Plan Note (Addendum)
Controlled BP at goal Adjusted metoprolol to '25mg'$  daily from '25mg'$  twice daily  Continue enalapril '20mg'$  daily, refills provided

## 2022-08-10 DIAGNOSIS — I2089 Other forms of angina pectoris: Secondary | ICD-10-CM | POA: Diagnosis not present

## 2022-08-22 DIAGNOSIS — I10 Essential (primary) hypertension: Secondary | ICD-10-CM | POA: Diagnosis not present

## 2022-08-22 DIAGNOSIS — E782 Mixed hyperlipidemia: Secondary | ICD-10-CM | POA: Diagnosis not present

## 2022-08-22 DIAGNOSIS — I2089 Other forms of angina pectoris: Secondary | ICD-10-CM | POA: Diagnosis not present

## 2022-08-24 NOTE — Progress Notes (Unsigned)
      Established patient visit   Patient: Lynn Wall   DOB: 1927/07/27   88 y.o. Female  MRN: [id number] Visit Date: 08/28/2022  Today's healthcare provider: [name], MD   No chief complaint on file.  Subjective    HPI  Follow up for constipation  The patient was last seen for this 3 weeks ago. Changes made at last visit include added senna 8.6 daily, added lactulose 10 mg liquid. Add miralax and increase water intake. Decrease use of tramadol.  She reports {excellent/good/fair/poor:19665} compliance with treatment. She feels that condition is {improved/worse/unchanged:3041574}. She {is/is not:21021397} having side effects. ***  -----------------------------------------------------------------------------------------   Medications: Outpatient Medications Prior to Visit  Medication Sig   aspirin EC 81 MG tablet Take 81 mg by mouth daily.   docusate sodium (STOOL SOFTENER) 100 MG capsule Take 1 capsule (100 mg total) by mouth daily. (Patient taking differently: Take 200 mg by mouth daily.)   enalapril (VASOTEC) 20 MG tablet Take 1 tablet (20 mg total) by mouth daily.   gabapentin (NEURONTIN) 300 MG capsule Take 1 capsule (300 mg total) by mouth at bedtime.   isosorbide mononitrate (IMDUR) 30 MG 24 hr tablet TAKE 1 TABLET BY MOUTH EVERY DAY   lactulose (CHRONULAC) 10 GM/15ML solution Take 15 mLs (10 g total) by mouth 2 (two) times daily as needed for moderate constipation.   linaclotide (LINZESS) 145 MCG CAPS capsule Take 1 capsule (145 mcg total) by mouth daily before breakfast.   MELATONIN PO Take 1 tablet by mouth daily.   metoprolol tartrate (LOPRESSOR) 25 MG tablet Take 1 tablet (25 mg total) by mouth in the morning, at noon, and at bedtime.   nitroGLYCERIN (NITROSTAT) 0.4 MG SL tablet Place 1 tablet (0.4 mg total) under the tongue every 5 (five) minutes as needed for chest pain.   omeprazole (PRILOSEC) 20 MG capsule Take 1 capsule (20 mg total) by  mouth as needed.   polyethylene glycol (MIRALAX / GLYCOLAX) 17 g packet Take 17 g by mouth 2 (two) times daily.   senna (SENOKOT) 8.6 MG tablet Take 1 tablet (8.6 mg total) by mouth 2 (two) times daily.   sertraline (ZOLOFT) 25 MG tablet TAKE 1 TABLET (25 MG TOTAL) BY MOUTH DAILY.   traZODone (DESYREL) 50 MG tablet TAKE 1 TABLET BY MOUTH EVERYDAY AT BEDTIME   No facility-administered medications prior to visit.    Review of Systems  Constitutional:  Negative for appetite change and fatigue.  Respiratory:  Negative for chest tightness and shortness of breath.   Cardiovascular:  Negative for chest pain.    {Labs  Heme  Chem  Endocrine  Serology  Results Review (optional):23779}   Objective    LMP  (LMP Unknown)  {Show previous vital signs (optional):23777}  Physical Exam  ***  No results found for any visits on 08/28/22.  Assessment & Plan     ***  No follow-ups on file.      {provider attestation***:1}   [name], MD  [facility] [phone] (phone) [phone] (fax)  [place]

## 2022-08-28 ENCOUNTER — Encounter: Payer: Self-pay | Admitting: Family Medicine

## 2022-08-28 ENCOUNTER — Other Ambulatory Visit: Payer: Self-pay

## 2022-08-28 ENCOUNTER — Ambulatory Visit (INDEPENDENT_AMBULATORY_CARE_PROVIDER_SITE_OTHER): Payer: Medicare Other | Admitting: Family Medicine

## 2022-08-28 VITALS — BP 134/78 | HR 60 | Temp 98.2°F | Resp 16 | Wt 130.0 lb

## 2022-08-28 DIAGNOSIS — K5903 Drug induced constipation: Secondary | ICD-10-CM | POA: Diagnosis not present

## 2022-08-28 DIAGNOSIS — G4709 Other insomnia: Secondary | ICD-10-CM

## 2022-08-28 DIAGNOSIS — T402X5A Adverse effect of other opioids, initial encounter: Secondary | ICD-10-CM | POA: Diagnosis not present

## 2022-08-28 DIAGNOSIS — R5383 Other fatigue: Secondary | ICD-10-CM

## 2022-08-28 DIAGNOSIS — K581 Irritable bowel syndrome with constipation: Secondary | ICD-10-CM

## 2022-08-28 MED ORDER — TRAMADOL HCL 50 MG PO TABS: 50.0000 mg | ORAL_TABLET | Freq: Two times a day (BID) | ORAL | 0 refills | Status: DC | PRN

## 2022-08-28 MED ORDER — TRAZODONE HCL 50 MG PO TABS: ORAL_TABLET | ORAL | 2 refills | Status: DC

## 2022-08-28 MED ORDER — LINACLOTIDE 145 MCG PO CAPS: 145.0000 ug | ORAL_CAPSULE | Freq: Every day | ORAL | 5 refills | Status: DC

## 2022-08-28 NOTE — Telephone Encounter (Signed)
Copied from [place] [id number]. Topic: General - Inquiry >> Aug 28, 2022  2:36 PM Penni Bombard wrote: Pt 's daughter called saying her mom was just at the office and her medications especially the Linzess needs to be refilled .  [facility] [place]

## 2022-08-29 NOTE — Assessment & Plan Note (Signed)
Improved She will continue MiraLAX and senna Patient has lactulose in the event that MiraLAX and senna did not allow her to have daily, soft bowel movements Reviewed this tier approach to constipation with both the patient and her daughter

## 2022-08-29 NOTE — Assessment & Plan Note (Signed)
Refills requested for trazodone  Patient is reported to not sleep without this med  She has been taking 1.5 pills on some nights due to trouble sleeping

## 2022-09-03 ENCOUNTER — Other Ambulatory Visit: Payer: Self-pay | Admitting: Family Medicine

## 2022-09-03 DIAGNOSIS — H353211 Exudative age-related macular degeneration, right eye, with active choroidal neovascularization: Secondary | ICD-10-CM | POA: Diagnosis not present

## 2022-09-03 DIAGNOSIS — H353221 Exudative age-related macular degeneration, left eye, with active choroidal neovascularization: Secondary | ICD-10-CM | POA: Diagnosis not present

## 2022-09-03 DIAGNOSIS — G4709 Other insomnia: Secondary | ICD-10-CM

## 2022-09-20 ENCOUNTER — Other Ambulatory Visit: Payer: Self-pay | Admitting: Family Medicine

## 2022-09-20 MED ORDER — SERTRALINE HCL 25 MG PO TABS: 25.0000 mg | ORAL_TABLET | Freq: Every day | ORAL | 1 refills | Status: DC

## 2022-09-20 MED ORDER — ISOSORBIDE MONONITRATE ER 30 MG PO TB24: 30.0000 mg | ORAL_TABLET | Freq: Every day | ORAL | 1 refills | Status: DC

## 2022-09-20 NOTE — Telephone Encounter (Signed)
Medication Refill - Medication: isosorbide mononitrate (IMDUR) 30 MG 24 hr tablet , sertraline (ZOLOFT) 25 MG tablet   Pt daughter stated she needs medication filled today. [facility] been trying to reach the office, but they have not received a response. Stated patient is out of her medication.  Has the patient contacted their pharmacy? Yes.    (Agent: If yes, when and what did the pharmacy advise?)  Preferred Pharmacy (with phone number or street name):  [id number] [id number] Phone: [phone]: [phone] Hours: Not open 24 hours   Has the patient been seen for an appointment in the last year OR does the patient have an upcoming appointment? Yes.    Agent: Please be advised that RX refills may take up to 3 business days. We ask that you follow-up with your pharmacy.

## 2022-09-20 NOTE — Telephone Encounter (Signed)
Requested Prescriptions  Pending Prescriptions Disp Refills   isosorbide mononitrate (IMDUR) 30 MG 24 hr tablet 90 tablet 1    Sig: Take 1 tablet (30 mg total) by mouth daily.     Cardiovascular:  Nitrates Passed - 09/20/2022 11:29 AM      Passed - Last BP in normal range    BP Readings from Last 1 Encounters:  08/28/22 134/78         Passed - Last Heart Rate in normal range    Pulse Readings from Last 1 Encounters:  08/28/22 60         Passed - Valid encounter within last 12 months    Recent Outpatient Visits           3 weeks ago Constipation due to opioid therapy   [place] Simmons-Robinson, [place], MD   1 month ago Constipation due to opioid therapy   [place] Simmons-Robinson, [place], MD   2 months ago Chronic pain of right hip   [place], [place], MD   4 months ago Diabetes mellitus type 2 without retinopathy [facility])   [place] Jerrol Banana., MD   8 months ago Diabetes mellitus type 2 without retinopathy [facility])   [place] Jerrol Banana., MD       Future Appointments             In 2 months Simmons-Robinson, Riki Sheer, MD [facility]             sertraline (ZOLOFT) 25 MG tablet 90 tablet 1    Sig: Take 1 tablet (25 mg total) by mouth daily.     Psychiatry:  Antidepressants - SSRI - sertraline Passed - 09/20/2022 11:29 AM      Passed - AST in normal range and within 360 days    AST  Date Value Ref Range Status  05/10/2022 18 0 - 40 IU/L Final         Passed - ALT in normal range and within 360 days    ALT  Date Value Ref Range Status  05/10/2022 8 0 - 32 IU/L Final         Passed - Completed PHQ-2 or PHQ-9 in the last 360 days      Passed - Valid encounter within last 6 months    Recent Outpatient Visits           3 weeks ago Constipation  due to opioid therapy   [place] Simmons-Robinson, [place], MD   1 month ago Constipation due to opioid therapy   [place] Simmons-Robinson, [place], MD   2 months ago Chronic pain of right hip   [place], [place], MD   4 months ago Diabetes mellitus type 2 without retinopathy [facility])   [place] Jerrol Banana., MD   8 months ago Diabetes mellitus type 2 without retinopathy [facility])   [place] Jerrol Banana., MD       Future Appointments             In 2 months Simmons-Robinson, Riki Sheer, MD [facility], [facility]

## 2022-10-08 ENCOUNTER — Telehealth: Payer: Self-pay | Admitting: Family Medicine

## 2022-10-08 NOTE — Telephone Encounter (Signed)
[facility] and [facility] to [place], [facility] about the refill(s) trazodone requested. Advised it was sent on 08/28/22 #90/2 refill(s). He says they have it and the last time she tried to refill it was too early because she received a refill in December. He ran it through and it is ready for refill, so he says he will get it ready for pickup.

## 2022-10-08 NOTE — Telephone Encounter (Signed)
Medication Refill - Medication: traZODone (DESYREL) 50 MG tablet   Has the patient contacted their pharmacy? Yes.      Pt referred to PCP.  Pt dose was upped and she ran out earlier.    Preferred Pharmacy (with phone number or street name):  [id number] [address] Phone: [phone] Fax: [phone]    Has the patient been seen for an appointment in the last year OR does the patient have an upcoming appointment? Yes.    Agent: Please be advised that RX refills may take up to 3 business days. We ask that you follow-up with your pharmacy.

## 2022-10-22 DIAGNOSIS — H353211 Exudative age-related macular degeneration, right eye, with active choroidal neovascularization: Secondary | ICD-10-CM | POA: Diagnosis not present

## 2022-11-15 ENCOUNTER — Other Ambulatory Visit: Payer: Self-pay | Admitting: Family Medicine

## 2022-11-15 DIAGNOSIS — G4709 Other insomnia: Secondary | ICD-10-CM

## 2022-11-26 DIAGNOSIS — H353211 Exudative age-related macular degeneration, right eye, with active choroidal neovascularization: Secondary | ICD-10-CM | POA: Diagnosis not present

## 2022-11-26 DIAGNOSIS — H353221 Exudative age-related macular degeneration, left eye, with active choroidal neovascularization: Secondary | ICD-10-CM | POA: Diagnosis not present

## 2022-11-26 NOTE — Progress Notes (Unsigned)
I,Lynn Wall,acting as a scribe for [employer], MD.,have documented all relevant documentation on the behalf of Lynn Foster, MD,as directed by  Lynn Foster, MD while in the presence of Lynn Foster, MD.   Established patient visit   Patient: Lynn Wall   DOB: 01-Sep-1926   89 y.o. Female  MRN: [id number] Visit Date: 11/28/2022  Today's healthcare provider: [name], MD   Chief Complaint  Patient presents with   Follow-up   Subjective    HPI   Diabetes Mellitus Type II, Follow-up  Lab Results  Component Value Date   HGBA1C 6.2 (A) 11/28/2022   HGBA1C 6.9 (H) 05/10/2022   HGBA1C 6.8 (A) 01/03/2022   Wt Readings from Last 3 Encounters:  11/28/22 129 lb 3.2 oz (58.6 kg)  08/28/22 130 lb (59 kg)  08/06/22 131 lb (59.4 kg)   Last seen for diabetes 6 months ago.  Management since then includes Continue to just work on lifestyle.  Symptoms: No fatigue No foot ulcerations  Yes appetite changes Yes nausea  Yes paresthesia of the feet  No polydipsia  No polyuria Yes visual disturbances   No vomiting     Home blood sugar records:  not being checked current insulin regiment: none   Pertinent Labs: Lab Results  Component Value Date   CHOL 144 07/24/2021   HDL 50 07/24/2021   LDLCALC 78 07/24/2021   TRIG 82 07/24/2021   CHOLHDL 2.9 07/24/2021   Lab Results  Component Value Date   NA 144 05/10/2022   K 4.4 05/10/2022   CREATININE 0.86 05/10/2022   EGFR 63 05/10/2022   LABMICR See below: 03/28/2015     ---------------------------------------------------------------------------------------------------  Hypertension, follow-up  BP Readings from Last 3 Encounters:  11/28/22 128/79  08/28/22 134/78  08/06/22 133/86   Wt Readings from Last 3 Encounters:  11/28/22 129 lb 3.2 oz (58.6 kg)  08/28/22 130 lb (59 kg)  08/06/22 131 lb (59.4 kg)     She was last seen for hypertension 3  months ago.  BP at that visit was 13/86. Management since that visit includes continue metoprolol 25mg  TID and enalapril 20mg  daily.  She reports excellent compliance with treatment. She is not having side effects.   Outside blood pressures are not being checked. Symptoms: No chest pain No chest pressure  No palpitations No syncope  No dyspnea No orthopnea  No paroxysmal nocturnal dyspnea No lower extremity edema   Pertinent labs Lab Results  Component Value Date   CHOL 144 07/24/2021   HDL 50 07/24/2021   LDLCALC 78 07/24/2021   TRIG 82 07/24/2021   CHOLHDL 2.9 07/24/2021   Lab Results  Component Value Date   NA 144 05/10/2022   K 4.4 05/10/2022   CREATININE 0.86 05/10/2022   EGFR 63 05/10/2022   GLUCOSE 134 (H) 05/10/2022   TSH 0.785 05/10/2022     The ASCVD Risk score (Arnett DK, et al., 2019) failed to calculate for the following reasons:   The 2019 ASCVD risk score is only valid for ages 77 to 102  ---------------------------------------------------------------------------------------------------   Constipation associated with chronic opioid  Continues to have irregular bowel movements, sometimes she has to go 2-3 times per day and then other times it is small volume stool  Daughter states that she eats minimally  She has only been able to tolerate one senna because two tabs cause her to have more cramps  She reports that she has a lot of cramping and  so she limits how much she eats  Patient has been on and off of Linzess due to not noticing much of a difference in her medication  When she starts having diarrhea, she has been able to take imodium in the past She has taken bentyl in the past  She states that she has constipation more than diarrhea  Stools are often hard and round     Medications: Outpatient Medications Prior to Visit  Medication Sig   aspirin EC 81 MG tablet Take 81 mg by mouth daily.   docusate sodium (STOOL SOFTENER) 100 MG capsule Take 1  capsule (100 mg total) by mouth daily. (Patient taking differently: Take 200 mg by mouth daily.)   gabapentin (NEURONTIN) 300 MG capsule Take 1 capsule (300 mg total) by mouth at bedtime.   MELATONIN PO Take 1 tablet by mouth daily.   nitroGLYCERIN (NITROSTAT) 0.4 MG SL tablet Place 1 tablet (0.4 mg total) under the tongue every 5 (five) minutes as needed for chest pain.   traZODone (DESYREL) 50 MG tablet TAKE 1.5 TABS AT BEDTIME AS NEEDED FOR SLEEP   [DISCONTINUED] enalapril (VASOTEC) 20 MG tablet Take 1 tablet (20 mg total) by mouth daily.   [DISCONTINUED] isosorbide mononitrate (IMDUR) 30 MG 24 hr tablet Take 1 tablet (30 mg total) by mouth daily.   [DISCONTINUED] lactulose (CHRONULAC) 10 GM/15ML solution Take 15 mLs (10 g total) by mouth 2 (two) times daily as needed for moderate constipation.   [DISCONTINUED] metoprolol tartrate (LOPRESSOR) 25 MG tablet Take 1 tablet (25 mg total) by mouth in the morning, at noon, and at bedtime.   [DISCONTINUED] omeprazole (PRILOSEC) 20 MG capsule Take 1 capsule (20 mg total) by mouth as needed.   [DISCONTINUED] senna (SENOKOT) 8.6 MG tablet Take 1 tablet (8.6 mg total) by mouth 2 (two) times daily.   [DISCONTINUED] sertraline (ZOLOFT) 25 MG tablet Take 1 tablet (25 mg total) by mouth daily.   [DISCONTINUED] traMADol (ULTRAM) 50 MG tablet Take 1 tablet (50 mg total) by mouth every 12 (twelve) hours as needed.   [DISCONTINUED] linaclotide (LINZESS) 145 MCG CAPS capsule Take 1 capsule (145 mcg total) by mouth daily before breakfast.   No facility-administered medications prior to visit.    Review of Systems     Objective    BP 128/79 (BP Location: Left Arm, Patient Position: Sitting, Cuff Size: Normal)   Pulse 65   Resp 16   Wt 129 lb 3.2 oz (58.6 kg)   LMP  (LMP Unknown)   BMI 22.89 kg/m    Physical Exam Vitals reviewed.  Constitutional:      General: She is not in acute distress.    Appearance: Normal appearance. She is not ill-appearing,  toxic-appearing or diaphoretic.  Eyes:     Conjunctiva/sclera: Conjunctivae normal.  Cardiovascular:     Rate and Rhythm: Normal rate and regular rhythm.     Pulses: Normal pulses.     Heart sounds: Normal heart sounds. No murmur heard.    No friction rub. No gallop.  Pulmonary:     Effort: Pulmonary effort is normal. No respiratory distress.     Breath sounds: Normal breath sounds. No stridor. No wheezing, rhonchi or rales.  Abdominal:     General: Bowel sounds are normal. There is no distension.     Palpations: Abdomen is soft.     Tenderness: There is no abdominal tenderness.  Musculoskeletal:     Right lower leg: No edema.     Left  lower leg: No edema.  Lymphadenopathy:     Cervical: No cervical adenopathy.  Skin:    Findings: No erythema or rash.  Neurological:     Mental Status: She is alert and oriented to person, place, and time.     Results for orders placed or performed in visit on 11/28/22  POCT HgB A1C  Result Value Ref Range   Hemoglobin A1C 6.2 (A) 4.0 - 5.6 %   Est. average glucose Bld gHb Est-mCnc 131     Assessment & Plan     Problem List Items Addressed This Visit       Cardiovascular and Mediastinum   Benign essential HTN - Primary    Controlled BP at goal Continue metoprolol 25 mg 3 times daily and enalapril 20 mg daily       Relevant Medications   enalapril (VASOTEC) 20 MG tablet   isosorbide mononitrate (IMDUR) 30 MG 24 hr tablet   metoprolol tartrate (LOPRESSOR) 25 MG tablet     Digestive   Acid reflux    Chronic Symptoms well-controlled on omeprazole 20 mg as needed Refills provided       Relevant Medications   senna (SENOKOT) 8.6 MG tablet   omeprazole (PRILOSEC) 20 MG capsule   lactulose (CHRONULAC) 10 GM/15ML solution   linaclotide (LINZESS) 72 MCG capsule   Constipation due to opioid therapy    Chronic Symptoms continue to be bothersome Patient alternates between constipation with hard and dry stools or diarrhea We  discussed recommendation to continue senna once daily due to twice daily causing her to have side effects, discussed that she can continue to use Colace and senna if this is helpful for her otherwise would only need 1 of these agents, patient has stopped taking MiraLAX, advised patient to only use lactulose when she is unable to pass stool for days at a time, patient and her daughter voiced understanding Discussed that patient should restart Linzess at 72 mcg daily We considered restarting a trial of Bentyl however given patient's age and ophthalmologic conditions and history of recurrent UTI and concern for potential side effect of urinary retention, we decided against starting this medication for her GI issues Recommended that patient follow-up in 6 to 8 weeks and if symptoms have no improvement with these medication adjustments, we should consider gastroenterology referral for further evaluation and treatment recommendations Patient and daughter were in agreement      Irritable bowel syndrome with constipation    Recommended GI referral if symptoms are not responsive to Linzess 72 mcg  Patient will continue Colace, senna daily      Relevant Medications   senna (SENOKOT) 8.6 MG tablet   omeprazole (PRILOSEC) 20 MG capsule   lactulose (CHRONULAC) 10 GM/15ML solution   linaclotide (LINZESS) 72 MCG capsule     Endocrine   Diabetes mellitus type 2 without retinopathy    Chronic A1c repeated today, 6.2 down from 6.9 previously Diabetes is well-controlled Continue with lifestyle management Patient sees ophthalmology every 5 weeks for injections, up-to-date on eye exam Foot exam was completed today Referred to podiatry for toenail abnormality on left foot      Relevant Medications   enalapril (VASOTEC) 20 MG tablet   Other Relevant Orders   POCT HgB A1C (Completed)     Musculoskeletal and Integument   Ingrown toenail    New problem Referral to podiatry submitted today Diabetes foot  exam completed with normal pulses and no ulcerations or skin breakdown  Relevant Orders   Ambulatory referral to Podiatry     Return in about 6 weeks (around 01/09/2023) for constipation .       The entirety of the information documented in the History of Present Illness, Review of Systems and Physical Exam were personally obtained by me. Portions of this information were initially documented by Lyndel Pleasure, CMA. I, Lynn Foster, MD have reviewed the documentation above for thoroughness and accuracy.      Lynn Foster, MD  [facility] [phone] (phone) [phone] (fax)  [place]

## 2022-11-28 ENCOUNTER — Encounter: Payer: Self-pay | Admitting: Family Medicine

## 2022-11-28 ENCOUNTER — Ambulatory Visit (INDEPENDENT_AMBULATORY_CARE_PROVIDER_SITE_OTHER): Payer: Medicare Other | Admitting: Family Medicine

## 2022-11-28 VITALS — BP 128/79 | HR 65 | Resp 16 | Wt 129.2 lb

## 2022-11-28 DIAGNOSIS — T402X5A Adverse effect of other opioids, initial encounter: Secondary | ICD-10-CM | POA: Diagnosis not present

## 2022-11-28 DIAGNOSIS — E119 Type 2 diabetes mellitus without complications: Secondary | ICD-10-CM | POA: Diagnosis not present

## 2022-11-28 DIAGNOSIS — K5903 Drug induced constipation: Secondary | ICD-10-CM | POA: Diagnosis not present

## 2022-11-28 DIAGNOSIS — I10 Essential (primary) hypertension: Secondary | ICD-10-CM

## 2022-11-28 DIAGNOSIS — L600 Ingrowing nail: Secondary | ICD-10-CM

## 2022-11-28 DIAGNOSIS — K581 Irritable bowel syndrome with constipation: Secondary | ICD-10-CM

## 2022-11-28 DIAGNOSIS — K219 Gastro-esophageal reflux disease without esophagitis: Secondary | ICD-10-CM

## 2022-11-28 LAB — POCT GLYCOSYLATED HEMOGLOBIN (HGB A1C)
Est. average glucose Bld gHb Est-mCnc: 131
Hemoglobin A1C: 6.2 % — AB (ref 4.0–5.6)

## 2022-11-28 MED ORDER — LACTULOSE 10 GM/15ML PO SOLN: 10.0000 g | Freq: Two times a day (BID) | ORAL | 0 refills | Status: AC | PRN

## 2022-11-28 MED ORDER — SENNOSIDES 8.6 MG PO TABS: 1.0000 | ORAL_TABLET | Freq: Every day | ORAL | 1 refills | Status: AC

## 2022-11-28 MED ORDER — ROPINIROLE HCL 0.25 MG PO TABS: 0.2500 mg | ORAL_TABLET | Freq: Every day | ORAL | 3 refills | Status: DC

## 2022-11-28 MED ORDER — ENALAPRIL MALEATE 20 MG PO TABS
20.0000 mg | ORAL_TABLET | Freq: Every day | ORAL | 1 refills | Status: AC
Start: 2022-11-28 — End: ?

## 2022-11-28 MED ORDER — ISOSORBIDE MONONITRATE ER 30 MG PO TB24: 30.0000 mg | ORAL_TABLET | Freq: Every day | ORAL | 1 refills | Status: DC

## 2022-11-28 MED ORDER — OMEPRAZOLE 20 MG PO CPDR: 20.0000 mg | DELAYED_RELEASE_CAPSULE | ORAL | 0 refills | Status: DC | PRN

## 2022-11-28 MED ORDER — TRAMADOL HCL 50 MG PO TABS: 50.0000 mg | ORAL_TABLET | Freq: Every day | ORAL | 0 refills | Status: AC | PRN

## 2022-11-28 MED ORDER — LINACLOTIDE 72 MCG PO CAPS
145.0000 ug | ORAL_CAPSULE | Freq: Every day | ORAL | 2 refills | Status: AC
Start: 2022-11-28 — End: ?

## 2022-11-28 MED ORDER — SERTRALINE HCL 25 MG PO TABS: 25.0000 mg | ORAL_TABLET | Freq: Every day | ORAL | 1 refills | Status: AC

## 2022-11-28 MED ORDER — METOPROLOL TARTRATE 25 MG PO TABS: 25.0000 mg | ORAL_TABLET | Freq: Three times a day (TID) | ORAL | 1 refills | Status: AC

## 2022-11-28 NOTE — Patient Instructions (Addendum)
For the constipation, I would recommend continuing the once daily Senna and restart Linzess 63mcg daily. You can save the Lactulose for days when you constipation is more severe.    I have sent refills for your medications to the mail order pharmacy.   Your A1c is still well controlled at 6.2 daily.    Your blood pressure was well controlled today. Please continue the metoprolol 25mg  three times daily

## 2022-11-28 NOTE — Assessment & Plan Note (Signed)
Chronic Symptoms continue to be bothersome Patient alternates between constipation with hard and dry stools or diarrhea We discussed recommendation to continue senna once daily due to twice daily causing her to have side effects, discussed that she can continue to use Colace and senna if this is helpful for her otherwise would only need 1 of these agents, patient has stopped taking MiraLAX, advised patient to only use lactulose when she is unable to pass stool for days at a time, patient and her daughter voiced understanding Discussed that patient should restart Linzess at 72 mcg daily We considered restarting a trial of Bentyl however given patient's age and ophthalmologic conditions and history of recurrent UTI and concern for potential side effect of urinary retention, we decided against starting this medication for her GI issues Recommended that patient follow-up in 6 to 8 weeks and if symptoms have no improvement with these medication adjustments, we should consider gastroenterology referral for further evaluation and treatment recommendations Patient and daughter were in agreement

## 2022-11-28 NOTE — Assessment & Plan Note (Signed)
New problem Referral to podiatry submitted today Diabetes foot exam completed with normal pulses and no ulcerations or skin breakdown

## 2022-11-28 NOTE — Assessment & Plan Note (Signed)
Chronic Symptoms well-controlled on omeprazole 20 mg as needed Refills provided

## 2022-11-28 NOTE — Assessment & Plan Note (Signed)
Chronic A1c repeated today, 6.2 down from 6.9 previously Diabetes is well-controlled Continue with lifestyle management Patient sees ophthalmology every 5 weeks for injections, up-to-date on eye exam Foot exam was completed today Referred to podiatry for toenail abnormality on left foot

## 2022-11-28 NOTE — Assessment & Plan Note (Addendum)
Controlled BP at goal Continue metoprolol 25 mg 3 times daily and enalapril 20 mg daily

## 2022-11-28 NOTE — Assessment & Plan Note (Signed)
Recommended GI referral if symptoms are not responsive to Linzess 72 mcg  Patient will continue Colace, senna daily

## 2022-11-29 DIAGNOSIS — I10 Essential (primary) hypertension: Secondary | ICD-10-CM | POA: Diagnosis not present

## 2022-11-29 DIAGNOSIS — I2089 Other forms of angina pectoris: Secondary | ICD-10-CM | POA: Diagnosis not present

## 2022-11-29 DIAGNOSIS — E782 Mixed hyperlipidemia: Secondary | ICD-10-CM | POA: Diagnosis not present

## 2022-12-03 ENCOUNTER — Other Ambulatory Visit: Payer: Self-pay | Admitting: Family Medicine

## 2022-12-03 DIAGNOSIS — K219 Gastro-esophageal reflux disease without esophagitis: Secondary | ICD-10-CM

## 2022-12-03 MED ORDER — OMEPRAZOLE 20 MG PO CPDR: 20.0000 mg | DELAYED_RELEASE_CAPSULE | Freq: Every day | ORAL | 0 refills | Status: DC

## 2022-12-04 ENCOUNTER — Telehealth: Payer: Self-pay

## 2022-12-04 NOTE — Telephone Encounter (Signed)
Received a PA from [facility] for Linzess 72 MCG.PA done today and this is the  Outcome Additional Information Required This medication or product is on your plan's list of covered drugs. Prior authorization is not required at this time. If your pharmacy has questions regarding the processing of your prescription, please have them call the [facility] pharmacy help desk at [phone]. **Please note: This request was submitted electronically. Formulary lowering, tiering exception, cost reduction and/or pre-benefit determination review (including prospective Medicare hospice reviews) requests cannot be requested using this method of submission. Providers contact us at [phone] for further assistance. Drug Linzess capsules ePA cloud logo

## 2022-12-31 DIAGNOSIS — H353211 Exudative age-related macular degeneration, right eye, with active choroidal neovascularization: Secondary | ICD-10-CM | POA: Diagnosis not present

## 2023-01-17 ENCOUNTER — Ambulatory Visit: Payer: Medicare Other | Admitting: Family Medicine

## 2023-01-18 DIAGNOSIS — S322XXA Fracture of coccyx, initial encounter for closed fracture: Secondary | ICD-10-CM | POA: Diagnosis not present

## 2023-01-18 DIAGNOSIS — M47814 Spondylosis without myelopathy or radiculopathy, thoracic region: Secondary | ICD-10-CM | POA: Diagnosis not present

## 2023-01-18 DIAGNOSIS — Z66 Do not resuscitate: Secondary | ICD-10-CM | POA: Diagnosis not present

## 2023-01-18 DIAGNOSIS — R2681 Unsteadiness on feet: Secondary | ICD-10-CM | POA: Diagnosis not present

## 2023-01-18 DIAGNOSIS — M25551 Pain in right hip: Secondary | ICD-10-CM | POA: Diagnosis not present

## 2023-01-18 DIAGNOSIS — K5909 Other constipation: Secondary | ICD-10-CM | POA: Diagnosis not present

## 2023-01-18 DIAGNOSIS — S12601A Unspecified nondisplaced fracture of seventh cervical vertebra, initial encounter for closed fracture: Secondary | ICD-10-CM | POA: Diagnosis not present

## 2023-01-18 DIAGNOSIS — S4991XA Unspecified injury of right shoulder and upper arm, initial encounter: Secondary | ICD-10-CM | POA: Diagnosis not present

## 2023-01-18 DIAGNOSIS — S22039A Unspecified fracture of third thoracic vertebra, initial encounter for closed fracture: Secondary | ICD-10-CM | POA: Diagnosis not present

## 2023-01-18 DIAGNOSIS — T402X5A Adverse effect of other opioids, initial encounter: Secondary | ICD-10-CM | POA: Diagnosis not present

## 2023-01-18 DIAGNOSIS — R0600 Dyspnea, unspecified: Secondary | ICD-10-CM | POA: Diagnosis not present

## 2023-01-18 DIAGNOSIS — S22030A Wedge compression fracture of third thoracic vertebra, initial encounter for closed fracture: Secondary | ICD-10-CM | POA: Diagnosis not present

## 2023-01-18 DIAGNOSIS — S22038A Other fracture of third thoracic vertebra, initial encounter for closed fracture: Secondary | ICD-10-CM | POA: Diagnosis not present

## 2023-01-18 DIAGNOSIS — M542 Cervicalgia: Secondary | ICD-10-CM | POA: Diagnosis not present

## 2023-01-18 DIAGNOSIS — M6259 Muscle wasting and atrophy, not elsewhere classified, multiple sites: Secondary | ICD-10-CM | POA: Diagnosis not present

## 2023-01-18 DIAGNOSIS — Z79810 Long term (current) use of selective estrogen receptor modulators (SERMs): Secondary | ICD-10-CM | POA: Diagnosis not present

## 2023-01-18 DIAGNOSIS — Z7984 Long term (current) use of oral hypoglycemic drugs: Secondary | ICD-10-CM | POA: Diagnosis not present

## 2023-01-18 DIAGNOSIS — M6281 Muscle weakness (generalized): Secondary | ICD-10-CM | POA: Diagnosis not present

## 2023-01-18 DIAGNOSIS — R279 Unspecified lack of coordination: Secondary | ICD-10-CM | POA: Diagnosis not present

## 2023-01-18 DIAGNOSIS — W19XXXD Unspecified fall, subsequent encounter: Secondary | ICD-10-CM | POA: Diagnosis not present

## 2023-01-18 DIAGNOSIS — M47812 Spondylosis without myelopathy or radiculopathy, cervical region: Secondary | ICD-10-CM | POA: Diagnosis not present

## 2023-01-18 DIAGNOSIS — S12601D Unspecified nondisplaced fracture of seventh cervical vertebra, subsequent encounter for fracture with routine healing: Secondary | ICD-10-CM | POA: Diagnosis not present

## 2023-01-18 DIAGNOSIS — K5900 Constipation, unspecified: Secondary | ICD-10-CM | POA: Diagnosis not present

## 2023-01-18 DIAGNOSIS — R296 Repeated falls: Secondary | ICD-10-CM | POA: Diagnosis not present

## 2023-01-18 DIAGNOSIS — S3210XD Unspecified fracture of sacrum, subsequent encounter for fracture with routine healing: Secondary | ICD-10-CM | POA: Diagnosis not present

## 2023-01-18 DIAGNOSIS — R9431 Abnormal electrocardiogram [ECG] [EKG]: Secondary | ICD-10-CM | POA: Diagnosis not present

## 2023-01-18 DIAGNOSIS — S12691A Other nondisplaced fracture of seventh cervical vertebra, initial encounter for closed fracture: Secondary | ICD-10-CM | POA: Diagnosis not present

## 2023-01-18 DIAGNOSIS — S22039D Unspecified fracture of third thoracic vertebra, subsequent encounter for fracture with routine healing: Secondary | ICD-10-CM | POA: Diagnosis not present

## 2023-01-18 DIAGNOSIS — S32010A Wedge compression fracture of first lumbar vertebra, initial encounter for closed fracture: Secondary | ICD-10-CM | POA: Diagnosis not present

## 2023-01-18 DIAGNOSIS — S0003XA Contusion of scalp, initial encounter: Secondary | ICD-10-CM | POA: Diagnosis not present

## 2023-01-18 DIAGNOSIS — R0902 Hypoxemia: Secondary | ICD-10-CM | POA: Diagnosis not present

## 2023-01-18 DIAGNOSIS — J9811 Atelectasis: Secondary | ICD-10-CM | POA: Diagnosis not present

## 2023-01-18 DIAGNOSIS — G2581 Restless legs syndrome: Secondary | ICD-10-CM | POA: Diagnosis not present

## 2023-01-18 DIAGNOSIS — G4700 Insomnia, unspecified: Secondary | ICD-10-CM | POA: Diagnosis not present

## 2023-01-18 DIAGNOSIS — Z7982 Long term (current) use of aspirin: Secondary | ICD-10-CM | POA: Diagnosis not present

## 2023-01-18 DIAGNOSIS — R41841 Cognitive communication deficit: Secondary | ICD-10-CM | POA: Diagnosis not present

## 2023-01-18 DIAGNOSIS — S32039A Unspecified fracture of third lumbar vertebra, initial encounter for closed fracture: Secondary | ICD-10-CM | POA: Diagnosis not present

## 2023-01-18 DIAGNOSIS — H9190 Unspecified hearing loss, unspecified ear: Secondary | ICD-10-CM | POA: Diagnosis not present

## 2023-01-18 DIAGNOSIS — R5381 Other malaise: Secondary | ICD-10-CM | POA: Diagnosis not present

## 2023-01-18 DIAGNOSIS — S3210XA Unspecified fracture of sacrum, initial encounter for closed fracture: Secondary | ICD-10-CM | POA: Diagnosis not present

## 2023-01-18 DIAGNOSIS — M79651 Pain in right thigh: Secondary | ICD-10-CM | POA: Diagnosis not present

## 2023-01-18 DIAGNOSIS — K5903 Drug induced constipation: Secondary | ICD-10-CM | POA: Diagnosis not present

## 2023-01-18 DIAGNOSIS — I10 Essential (primary) hypertension: Secondary | ICD-10-CM | POA: Diagnosis not present

## 2023-01-18 DIAGNOSIS — S32049A Unspecified fracture of fourth lumbar vertebra, initial encounter for closed fracture: Secondary | ICD-10-CM | POA: Diagnosis not present

## 2023-01-18 DIAGNOSIS — S12600A Unspecified displaced fracture of seventh cervical vertebra, initial encounter for closed fracture: Secondary | ICD-10-CM | POA: Diagnosis not present

## 2023-01-18 DIAGNOSIS — M810 Age-related osteoporosis without current pathological fracture: Secondary | ICD-10-CM | POA: Diagnosis not present

## 2023-01-18 DIAGNOSIS — G8929 Other chronic pain: Secondary | ICD-10-CM | POA: Diagnosis not present

## 2023-01-18 DIAGNOSIS — Z741 Need for assistance with personal care: Secondary | ICD-10-CM | POA: Diagnosis not present

## 2023-01-18 DIAGNOSIS — E119 Type 2 diabetes mellitus without complications: Secondary | ICD-10-CM | POA: Diagnosis not present

## 2023-01-18 DIAGNOSIS — K219 Gastro-esophageal reflux disease without esophagitis: Secondary | ICD-10-CM | POA: Diagnosis not present

## 2023-01-22 DIAGNOSIS — S22008S Other fracture of unspecified thoracic vertebra, sequela: Secondary | ICD-10-CM | POA: Diagnosis not present

## 2023-01-22 DIAGNOSIS — R41841 Cognitive communication deficit: Secondary | ICD-10-CM | POA: Diagnosis not present

## 2023-01-22 DIAGNOSIS — S12600S Unspecified displaced fracture of seventh cervical vertebra, sequela: Secondary | ICD-10-CM | POA: Diagnosis not present

## 2023-01-22 DIAGNOSIS — Z741 Need for assistance with personal care: Secondary | ICD-10-CM | POA: Diagnosis not present

## 2023-01-22 DIAGNOSIS — R296 Repeated falls: Secondary | ICD-10-CM | POA: Diagnosis not present

## 2023-01-22 DIAGNOSIS — E441 Mild protein-calorie malnutrition: Secondary | ICD-10-CM | POA: Diagnosis not present

## 2023-01-22 DIAGNOSIS — S12601D Unspecified nondisplaced fracture of seventh cervical vertebra, subsequent encounter for fracture with routine healing: Secondary | ICD-10-CM | POA: Diagnosis not present

## 2023-01-22 DIAGNOSIS — G4700 Insomnia, unspecified: Secondary | ICD-10-CM | POA: Diagnosis not present

## 2023-01-22 DIAGNOSIS — S22039D Unspecified fracture of third thoracic vertebra, subsequent encounter for fracture with routine healing: Secondary | ICD-10-CM | POA: Diagnosis not present

## 2023-01-22 DIAGNOSIS — M6259 Muscle wasting and atrophy, not elsewhere classified, multiple sites: Secondary | ICD-10-CM | POA: Diagnosis not present

## 2023-01-22 DIAGNOSIS — R2681 Unsteadiness on feet: Secondary | ICD-10-CM | POA: Diagnosis not present

## 2023-01-22 DIAGNOSIS — K5900 Constipation, unspecified: Secondary | ICD-10-CM | POA: Diagnosis not present

## 2023-01-22 DIAGNOSIS — W19XXXD Unspecified fall, subsequent encounter: Secondary | ICD-10-CM | POA: Diagnosis not present

## 2023-01-22 DIAGNOSIS — I10 Essential (primary) hypertension: Secondary | ICD-10-CM | POA: Diagnosis not present

## 2023-01-22 DIAGNOSIS — R195 Other fecal abnormalities: Secondary | ICD-10-CM | POA: Diagnosis not present

## 2023-01-22 DIAGNOSIS — R109 Unspecified abdominal pain: Secondary | ICD-10-CM | POA: Diagnosis not present

## 2023-01-22 DIAGNOSIS — M6281 Muscle weakness (generalized): Secondary | ICD-10-CM | POA: Diagnosis not present

## 2023-01-22 DIAGNOSIS — R5381 Other malaise: Secondary | ICD-10-CM | POA: Diagnosis not present

## 2023-01-22 DIAGNOSIS — S3210XD Unspecified fracture of sacrum, subsequent encounter for fracture with routine healing: Secondary | ICD-10-CM | POA: Diagnosis not present

## 2023-01-22 DIAGNOSIS — K219 Gastro-esophageal reflux disease without esophagitis: Secondary | ICD-10-CM | POA: Diagnosis not present

## 2023-01-22 DIAGNOSIS — R112 Nausea with vomiting, unspecified: Secondary | ICD-10-CM | POA: Diagnosis not present

## 2023-01-22 DIAGNOSIS — R279 Unspecified lack of coordination: Secondary | ICD-10-CM | POA: Diagnosis not present

## 2023-01-22 DIAGNOSIS — G2581 Restless legs syndrome: Secondary | ICD-10-CM | POA: Diagnosis not present

## 2023-01-22 DIAGNOSIS — Z79899 Other long term (current) drug therapy: Secondary | ICD-10-CM | POA: Diagnosis not present

## 2023-01-23 DIAGNOSIS — S12600S Unspecified displaced fracture of seventh cervical vertebra, sequela: Secondary | ICD-10-CM | POA: Diagnosis not present

## 2023-01-23 DIAGNOSIS — S22008S Other fracture of unspecified thoracic vertebra, sequela: Secondary | ICD-10-CM | POA: Diagnosis not present

## 2023-01-23 DIAGNOSIS — M6281 Muscle weakness (generalized): Secondary | ICD-10-CM | POA: Diagnosis not present

## 2023-01-23 DIAGNOSIS — S3210XD Unspecified fracture of sacrum, subsequent encounter for fracture with routine healing: Secondary | ICD-10-CM | POA: Diagnosis not present

## 2023-01-24 DIAGNOSIS — S12601D Unspecified nondisplaced fracture of seventh cervical vertebra, subsequent encounter for fracture with routine healing: Secondary | ICD-10-CM | POA: Diagnosis not present

## 2023-01-24 DIAGNOSIS — R112 Nausea with vomiting, unspecified: Secondary | ICD-10-CM | POA: Diagnosis not present

## 2023-01-25 DIAGNOSIS — S3210XD Unspecified fracture of sacrum, subsequent encounter for fracture with routine healing: Secondary | ICD-10-CM | POA: Diagnosis not present

## 2023-01-25 DIAGNOSIS — K5900 Constipation, unspecified: Secondary | ICD-10-CM | POA: Diagnosis not present

## 2023-01-25 DIAGNOSIS — S22039D Unspecified fracture of third thoracic vertebra, subsequent encounter for fracture with routine healing: Secondary | ICD-10-CM | POA: Diagnosis not present

## 2023-01-25 DIAGNOSIS — S12601D Unspecified nondisplaced fracture of seventh cervical vertebra, subsequent encounter for fracture with routine healing: Secondary | ICD-10-CM | POA: Diagnosis not present

## 2023-01-28 ENCOUNTER — Ambulatory Visit: Payer: Medicare Other | Admitting: Family Medicine

## 2023-01-28 DIAGNOSIS — E441 Mild protein-calorie malnutrition: Secondary | ICD-10-CM | POA: Diagnosis not present

## 2023-01-28 DIAGNOSIS — K5900 Constipation, unspecified: Secondary | ICD-10-CM | POA: Diagnosis not present

## 2023-01-28 DIAGNOSIS — S3210XD Unspecified fracture of sacrum, subsequent encounter for fracture with routine healing: Secondary | ICD-10-CM | POA: Diagnosis not present

## 2023-01-29 ENCOUNTER — Other Ambulatory Visit: Payer: Self-pay | Admitting: Family Medicine

## 2023-01-29 NOTE — Telephone Encounter (Signed)
Requested Prescriptions  Refused Prescriptions Disp Refills   rOPINIRole (REQUIP) 0.25 MG tablet [Pharmacy Med Name: rOPINIRole HCl 0.25 MG Oral Tablet] 90 tablet 3    Sig: TAKE 1 TABLET BY MOUTH AT  BEDTIME     Neurology:  Parkinsonian Agents Passed - 01/29/2023  4:36 AM      Passed - Last BP in normal range    BP Readings from Last 1 Encounters:  11/28/22 128/79         Passed - Last Heart Rate in normal range    Pulse Readings from Last 1 Encounters:  11/28/22 65         Passed - Valid encounter within last 12 months    Recent Outpatient Visits           2 months ago Benign essential HTN   [place] [facility] Simmons-Robinson, [place], MD   5 months ago Constipation due to opioid therapy   [place] [facility] Simmons-Robinson, [place], MD   5 months ago Constipation due to opioid therapy   [place] [facility] [place], [place], MD   6 months ago Chronic pain of right hip   [place] [facility] Simmons-Robinson, [place], MD   8 months ago Diabetes mellitus type 2 without retinopathy [facility])   [place] [facility] Bosie Clos, MD

## 2023-01-31 DIAGNOSIS — K5900 Constipation, unspecified: Secondary | ICD-10-CM | POA: Diagnosis not present

## 2023-01-31 DIAGNOSIS — S3210XD Unspecified fracture of sacrum, subsequent encounter for fracture with routine healing: Secondary | ICD-10-CM | POA: Diagnosis not present

## 2023-02-03 ENCOUNTER — Other Ambulatory Visit: Payer: Self-pay | Admitting: Family Medicine

## 2023-02-03 DIAGNOSIS — K219 Gastro-esophageal reflux disease without esophagitis: Secondary | ICD-10-CM

## 2023-02-05 DIAGNOSIS — S3210XD Unspecified fracture of sacrum, subsequent encounter for fracture with routine healing: Secondary | ICD-10-CM | POA: Diagnosis not present

## 2023-02-05 DIAGNOSIS — K5900 Constipation, unspecified: Secondary | ICD-10-CM | POA: Diagnosis not present

## 2023-02-06 ENCOUNTER — Other Ambulatory Visit: Payer: Self-pay | Admitting: *Deleted

## 2023-02-06 NOTE — Patient Outreach (Signed)
Per [facility] Mrs. Hepworth resides in [facility] skilled nursing facility. Screening for potential [facility] care coordination services as benefit of health plan and Primary Care Provider.   Telephone call made to daughter Gwinda [place] (daughter/DPR/HCPOA) [phone]. Patient identifiers confirmed. Drenda Freeze reports Mrs. Brissett lives alone. Drenda Freeze lives next door to Mrs. Albee. Drenda Freeze reports she is primary contact and is HCPOA. Drenda Freeze reports she thinks Mrs. Ferrone is discharging home too soon. States she decided against appealing the discharge. States home health will be arranged for PT/OT services. Explained [facility] Care Management services. Drenda Freeze is agreeable to [facility] Care Management services.   Drenda Freeze also reports she made PCP appointment with Dr. Sullivan Lone with [facility] on 03/20/23. States this is the earliest appointment.   Collaboration with Larey Brick, [facility] Child psychotherapist. Larey Brick states Mrs. Arrants will transition home on Friday, June 14th with [facility].   Will plan to refer for RN [facility] care coordination services upon SNF discharge.   Raiford Noble, MSN, RN,BSN [facility] Post Acute Care Coordinator [phone] (Direct dial)

## 2023-02-09 DIAGNOSIS — S3210XD Unspecified fracture of sacrum, subsequent encounter for fracture with routine healing: Secondary | ICD-10-CM | POA: Diagnosis not present

## 2023-02-09 DIAGNOSIS — Z79891 Long term (current) use of opiate analgesic: Secondary | ICD-10-CM | POA: Diagnosis not present

## 2023-02-09 DIAGNOSIS — Z9181 History of falling: Secondary | ICD-10-CM | POA: Diagnosis not present

## 2023-02-09 DIAGNOSIS — G2581 Restless legs syndrome: Secondary | ICD-10-CM | POA: Diagnosis not present

## 2023-02-09 DIAGNOSIS — Z7982 Long term (current) use of aspirin: Secondary | ICD-10-CM | POA: Diagnosis not present

## 2023-02-09 DIAGNOSIS — M810 Age-related osteoporosis without current pathological fracture: Secondary | ICD-10-CM | POA: Diagnosis not present

## 2023-02-09 DIAGNOSIS — I10 Essential (primary) hypertension: Secondary | ICD-10-CM | POA: Diagnosis not present

## 2023-02-09 DIAGNOSIS — G4700 Insomnia, unspecified: Secondary | ICD-10-CM | POA: Diagnosis not present

## 2023-02-09 DIAGNOSIS — K5909 Other constipation: Secondary | ICD-10-CM | POA: Diagnosis not present

## 2023-02-09 DIAGNOSIS — H3530 Unspecified macular degeneration: Secondary | ICD-10-CM | POA: Diagnosis not present

## 2023-02-11 ENCOUNTER — Telehealth: Payer: Self-pay

## 2023-02-11 ENCOUNTER — Other Ambulatory Visit: Payer: Self-pay | Admitting: *Deleted

## 2023-02-11 DIAGNOSIS — I10 Essential (primary) hypertension: Secondary | ICD-10-CM

## 2023-02-11 NOTE — Telephone Encounter (Signed)
Copied from CRM [id number]. Topic: Quick Communication - Home Health Verbal Orders >> Feb 11, 2023 10:36 AM Pincus Sanes wrote: Caller/Agency: [facility]/ Thelma [facility] Callback Number: [phone] Requesting/PT Therapy: 2 wk 3 & 1 wk 5

## 2023-02-11 NOTE — Telephone Encounter (Signed)
Verbals given  

## 2023-02-11 NOTE — Telephone Encounter (Signed)
Ok for verbal orders.    Elleana Stillson Simmons-Robinson, MD  [facility]  

## 2023-02-11 NOTE — Patient Outreach (Signed)
Verified in [facility] Lynn Wall discharged from [facility] SN[facility] on 02/08/23. Will have [facility].   Daughter/DPR/HCPOA Lynn Wall [phone] agreeable to [facility] care coordination services. Please writer's notes from 02/06/23. Drenda Freeze states she is primary contact at [phone].  Referral made to [facility] Care Coordination team.   Raiford Noble, MSN, RN,BSN [facility] Post Acute Care Coordinator [phone] (Direct dial)

## 2023-02-12 ENCOUNTER — Telehealth: Payer: Self-pay | Admitting: *Deleted

## 2023-02-12 NOTE — Progress Notes (Signed)
  Care Coordination   Note   02/12/2023 Name: Lynn Wall MRN: [id number] DOB: [birth year]  Lynn Wall is a 89 y.o. year old female who sees Simmons-Robinson, Tawanna Cooler, MD for primary care. I reached out to Burna Forts by phone today to offer care coordination services.  [name] was given information about Care Coordination services today including:   The Care Coordination services include support from the care team which includes your Nurse Coordinator, Clinical Social Worker, or Pharmacist.  The Care Coordination team is here to help remove barriers to the health concerns and goals most important to you. Care Coordination services are voluntary, and the patient may decline or stop services at any time by request to their care team member.   Care Coordination Consent Status: Patient agreed to services and verbal consent obtained.   Follow up plan:  Telephone appointment with care coordination team member scheduled for:  02/26/2023  Encounter Outcome:  Pt. Scheduled from referral   Burman Nieves, [facility] Care Coordination Care Guide Direct Dial: [phone]

## 2023-02-18 DIAGNOSIS — G4700 Insomnia, unspecified: Secondary | ICD-10-CM | POA: Diagnosis not present

## 2023-02-18 DIAGNOSIS — M810 Age-related osteoporosis without current pathological fracture: Secondary | ICD-10-CM | POA: Diagnosis not present

## 2023-02-18 DIAGNOSIS — K5909 Other constipation: Secondary | ICD-10-CM | POA: Diagnosis not present

## 2023-02-18 DIAGNOSIS — S3210XD Unspecified fracture of sacrum, subsequent encounter for fracture with routine healing: Secondary | ICD-10-CM | POA: Diagnosis not present

## 2023-02-18 DIAGNOSIS — G2581 Restless legs syndrome: Secondary | ICD-10-CM | POA: Diagnosis not present

## 2023-02-18 DIAGNOSIS — I10 Essential (primary) hypertension: Secondary | ICD-10-CM | POA: Diagnosis not present

## 2023-02-19 DIAGNOSIS — G4700 Insomnia, unspecified: Secondary | ICD-10-CM | POA: Diagnosis not present

## 2023-02-19 DIAGNOSIS — S3210XD Unspecified fracture of sacrum, subsequent encounter for fracture with routine healing: Secondary | ICD-10-CM | POA: Diagnosis not present

## 2023-02-19 DIAGNOSIS — M810 Age-related osteoporosis without current pathological fracture: Secondary | ICD-10-CM | POA: Diagnosis not present

## 2023-02-19 DIAGNOSIS — K5909 Other constipation: Secondary | ICD-10-CM | POA: Diagnosis not present

## 2023-02-19 DIAGNOSIS — I10 Essential (primary) hypertension: Secondary | ICD-10-CM | POA: Diagnosis not present

## 2023-02-19 DIAGNOSIS — G2581 Restless legs syndrome: Secondary | ICD-10-CM | POA: Diagnosis not present

## 2023-02-20 ENCOUNTER — Telehealth: Payer: Self-pay

## 2023-02-20 DIAGNOSIS — K5909 Other constipation: Secondary | ICD-10-CM | POA: Diagnosis not present

## 2023-02-20 DIAGNOSIS — G2581 Restless legs syndrome: Secondary | ICD-10-CM | POA: Diagnosis not present

## 2023-02-20 DIAGNOSIS — S3210XD Unspecified fracture of sacrum, subsequent encounter for fracture with routine healing: Secondary | ICD-10-CM | POA: Diagnosis not present

## 2023-02-20 DIAGNOSIS — M810 Age-related osteoporosis without current pathological fracture: Secondary | ICD-10-CM | POA: Diagnosis not present

## 2023-02-20 DIAGNOSIS — G4700 Insomnia, unspecified: Secondary | ICD-10-CM | POA: Diagnosis not present

## 2023-02-20 DIAGNOSIS — I10 Essential (primary) hypertension: Secondary | ICD-10-CM | POA: Diagnosis not present

## 2023-02-20 NOTE — Telephone Encounter (Signed)
Copied from CRM [id number]. Topic: Quick Communication - Home Health Verbal Orders >> Feb 20, 2023 10:20 AM Pincus Sanes wrote: Caller/Agency: [facility]/ Cheryln Manly Callback Number: [phone] Requesting OT   Frequency: 2 wk 2 /1 wk 1

## 2023-02-20 NOTE — Telephone Encounter (Signed)
Ok for verbal orders.    [place]ing Simmons-Robinson, MD  [facility]  

## 2023-02-20 NOTE — Telephone Encounter (Signed)
Orders given.  

## 2023-02-22 DIAGNOSIS — G4700 Insomnia, unspecified: Secondary | ICD-10-CM | POA: Diagnosis not present

## 2023-02-22 DIAGNOSIS — I10 Essential (primary) hypertension: Secondary | ICD-10-CM | POA: Diagnosis not present

## 2023-02-22 DIAGNOSIS — K5909 Other constipation: Secondary | ICD-10-CM | POA: Diagnosis not present

## 2023-02-22 DIAGNOSIS — S3210XD Unspecified fracture of sacrum, subsequent encounter for fracture with routine healing: Secondary | ICD-10-CM | POA: Diagnosis not present

## 2023-02-22 DIAGNOSIS — G2581 Restless legs syndrome: Secondary | ICD-10-CM | POA: Diagnosis not present

## 2023-02-22 DIAGNOSIS — M810 Age-related osteoporosis without current pathological fracture: Secondary | ICD-10-CM | POA: Diagnosis not present

## 2023-02-25 DIAGNOSIS — S3210XD Unspecified fracture of sacrum, subsequent encounter for fracture with routine healing: Secondary | ICD-10-CM | POA: Diagnosis not present

## 2023-02-25 DIAGNOSIS — G2581 Restless legs syndrome: Secondary | ICD-10-CM | POA: Diagnosis not present

## 2023-02-25 DIAGNOSIS — I10 Essential (primary) hypertension: Secondary | ICD-10-CM | POA: Diagnosis not present

## 2023-02-25 DIAGNOSIS — K5909 Other constipation: Secondary | ICD-10-CM | POA: Diagnosis not present

## 2023-02-25 DIAGNOSIS — G4700 Insomnia, unspecified: Secondary | ICD-10-CM | POA: Diagnosis not present

## 2023-02-25 DIAGNOSIS — M810 Age-related osteoporosis without current pathological fracture: Secondary | ICD-10-CM | POA: Diagnosis not present

## 2023-02-26 ENCOUNTER — Ambulatory Visit: Payer: Self-pay | Admitting: *Deleted

## 2023-02-26 DIAGNOSIS — G2581 Restless legs syndrome: Secondary | ICD-10-CM | POA: Diagnosis not present

## 2023-02-26 DIAGNOSIS — S3210XD Unspecified fracture of sacrum, subsequent encounter for fracture with routine healing: Secondary | ICD-10-CM | POA: Diagnosis not present

## 2023-02-26 DIAGNOSIS — G4700 Insomnia, unspecified: Secondary | ICD-10-CM | POA: Diagnosis not present

## 2023-02-26 DIAGNOSIS — K5909 Other constipation: Secondary | ICD-10-CM | POA: Diagnosis not present

## 2023-02-26 DIAGNOSIS — M810 Age-related osteoporosis without current pathological fracture: Secondary | ICD-10-CM | POA: Diagnosis not present

## 2023-02-26 DIAGNOSIS — I10 Essential (primary) hypertension: Secondary | ICD-10-CM | POA: Diagnosis not present

## 2023-02-26 NOTE — Patient Outreach (Signed)
  Care Coordination   Initial Visit Note   02/28/2023 Name: Lynn Wall MRN: [id number] DOB: 08/18/1927  Lynn Wall is a 89 y.o. year old female who sees Simmons-Robinson, Tawanna Cooler, MD for primary care. I spoke with  Drenda Freeze, daughter of Lynn Wall by phone today.  What matters to the patients health and wellness today?  Recently hospitalized for fall, requiring SNF stay.  Daughter report she is doing much better now.  Lives alone, but daughter lives beside her and provides care when needed.    Goals Addressed             This Visit's Progress    COMPLETED: Care Coordination Activities - no follow up needed       Interventions Today    Flowsheet Row Most Recent Value  Chronic Disease   Chronic disease during today's visit Other  [Fall]  General Interventions   General Interventions Discussed/Reviewed General Interventions Reviewed, Doctor Visits  Doctor Visits Discussed/Reviewed Doctor Visits Reviewed, PCP  [Will have follow up with new PCP on 7/23, Dr. Sullivan Lone with [facility]]  PCP/Specialist Visits Compliance with follow-up visit  Education Interventions   Education Provided Provided Education  Provided Verbal Education On Other, When to see the doctor  Jorje Guild with PT/OT with [facility]]  Safety Interventions   Safety Discussed/Reviewed Safety Reviewed, Fall Risk  [Advised to obtain life alert system and use walker for stability]              SDOH assessments and interventions completed:  Yes     Care Coordination Interventions:  Yes, provided   Follow up plan: No further intervention required.   Encounter Outcome:  Pt. Visit Completed   Kemper Durie, RN, MSN, [facility] Care Management Care Management Coordinator [phone]

## 2023-02-27 DIAGNOSIS — G2581 Restless legs syndrome: Secondary | ICD-10-CM | POA: Diagnosis not present

## 2023-02-27 DIAGNOSIS — G4700 Insomnia, unspecified: Secondary | ICD-10-CM | POA: Diagnosis not present

## 2023-02-27 DIAGNOSIS — I10 Essential (primary) hypertension: Secondary | ICD-10-CM | POA: Diagnosis not present

## 2023-02-27 DIAGNOSIS — S3210XD Unspecified fracture of sacrum, subsequent encounter for fracture with routine healing: Secondary | ICD-10-CM | POA: Diagnosis not present

## 2023-02-27 DIAGNOSIS — M810 Age-related osteoporosis without current pathological fracture: Secondary | ICD-10-CM | POA: Diagnosis not present

## 2023-02-27 DIAGNOSIS — K5909 Other constipation: Secondary | ICD-10-CM | POA: Diagnosis not present

## 2023-03-01 DIAGNOSIS — I10 Essential (primary) hypertension: Secondary | ICD-10-CM | POA: Diagnosis not present

## 2023-03-01 DIAGNOSIS — G2581 Restless legs syndrome: Secondary | ICD-10-CM | POA: Diagnosis not present

## 2023-03-01 DIAGNOSIS — S3210XD Unspecified fracture of sacrum, subsequent encounter for fracture with routine healing: Secondary | ICD-10-CM | POA: Diagnosis not present

## 2023-03-01 DIAGNOSIS — M810 Age-related osteoporosis without current pathological fracture: Secondary | ICD-10-CM | POA: Diagnosis not present

## 2023-03-01 DIAGNOSIS — G4700 Insomnia, unspecified: Secondary | ICD-10-CM | POA: Diagnosis not present

## 2023-03-01 DIAGNOSIS — K5909 Other constipation: Secondary | ICD-10-CM | POA: Diagnosis not present

## 2023-03-04 DIAGNOSIS — H353221 Exudative age-related macular degeneration, left eye, with active choroidal neovascularization: Secondary | ICD-10-CM | POA: Diagnosis not present

## 2023-03-04 DIAGNOSIS — H353211 Exudative age-related macular degeneration, right eye, with active choroidal neovascularization: Secondary | ICD-10-CM | POA: Diagnosis not present

## 2023-03-05 ENCOUNTER — Telehealth: Payer: Self-pay

## 2023-03-05 DIAGNOSIS — G2581 Restless legs syndrome: Secondary | ICD-10-CM | POA: Diagnosis not present

## 2023-03-05 DIAGNOSIS — S3210XD Unspecified fracture of sacrum, subsequent encounter for fracture with routine healing: Secondary | ICD-10-CM | POA: Diagnosis not present

## 2023-03-05 DIAGNOSIS — I10 Essential (primary) hypertension: Secondary | ICD-10-CM | POA: Diagnosis not present

## 2023-03-05 DIAGNOSIS — G4700 Insomnia, unspecified: Secondary | ICD-10-CM | POA: Diagnosis not present

## 2023-03-05 DIAGNOSIS — K5909 Other constipation: Secondary | ICD-10-CM | POA: Diagnosis not present

## 2023-03-05 DIAGNOSIS — M810 Age-related osteoporosis without current pathological fracture: Secondary | ICD-10-CM | POA: Diagnosis not present

## 2023-03-05 NOTE — Telephone Encounter (Signed)
Copied from CRM [id number]. Topic: General - Other >> Mar 05, 2023  2:55 PM Santiya F wrote: Reason for CRM: Darral Dash an Occupational Therapist with [facility] is calling in to request an Ot extenstion of 1 week 1 and 2 week 1.

## 2023-03-05 NOTE — Telephone Encounter (Signed)
Ok for verbal orders.    Tobiah Celestine Simmons-Robinson, MD  [facility]  

## 2023-03-08 DIAGNOSIS — S3210XD Unspecified fracture of sacrum, subsequent encounter for fracture with routine healing: Secondary | ICD-10-CM | POA: Diagnosis not present

## 2023-03-08 DIAGNOSIS — G4700 Insomnia, unspecified: Secondary | ICD-10-CM | POA: Diagnosis not present

## 2023-03-08 DIAGNOSIS — M810 Age-related osteoporosis without current pathological fracture: Secondary | ICD-10-CM | POA: Diagnosis not present

## 2023-03-08 DIAGNOSIS — G2581 Restless legs syndrome: Secondary | ICD-10-CM | POA: Diagnosis not present

## 2023-03-08 DIAGNOSIS — K5909 Other constipation: Secondary | ICD-10-CM | POA: Diagnosis not present

## 2023-03-08 DIAGNOSIS — I10 Essential (primary) hypertension: Secondary | ICD-10-CM | POA: Diagnosis not present

## 2023-03-11 DIAGNOSIS — Z79891 Long term (current) use of opiate analgesic: Secondary | ICD-10-CM | POA: Diagnosis not present

## 2023-03-11 DIAGNOSIS — Z7982 Long term (current) use of aspirin: Secondary | ICD-10-CM | POA: Diagnosis not present

## 2023-03-11 DIAGNOSIS — G2581 Restless legs syndrome: Secondary | ICD-10-CM | POA: Diagnosis not present

## 2023-03-11 DIAGNOSIS — M810 Age-related osteoporosis without current pathological fracture: Secondary | ICD-10-CM | POA: Diagnosis not present

## 2023-03-11 DIAGNOSIS — S3210XD Unspecified fracture of sacrum, subsequent encounter for fracture with routine healing: Secondary | ICD-10-CM | POA: Diagnosis not present

## 2023-03-11 DIAGNOSIS — K5909 Other constipation: Secondary | ICD-10-CM | POA: Diagnosis not present

## 2023-03-11 DIAGNOSIS — I10 Essential (primary) hypertension: Secondary | ICD-10-CM | POA: Diagnosis not present

## 2023-03-11 DIAGNOSIS — Z9181 History of falling: Secondary | ICD-10-CM | POA: Diagnosis not present

## 2023-03-11 DIAGNOSIS — H3530 Unspecified macular degeneration: Secondary | ICD-10-CM | POA: Diagnosis not present

## 2023-03-11 DIAGNOSIS — G4700 Insomnia, unspecified: Secondary | ICD-10-CM | POA: Diagnosis not present

## 2023-03-12 DIAGNOSIS — S3210XD Unspecified fracture of sacrum, subsequent encounter for fracture with routine healing: Secondary | ICD-10-CM | POA: Diagnosis not present

## 2023-03-12 DIAGNOSIS — M810 Age-related osteoporosis without current pathological fracture: Secondary | ICD-10-CM | POA: Diagnosis not present

## 2023-03-12 DIAGNOSIS — K5909 Other constipation: Secondary | ICD-10-CM | POA: Diagnosis not present

## 2023-03-12 DIAGNOSIS — I10 Essential (primary) hypertension: Secondary | ICD-10-CM | POA: Diagnosis not present

## 2023-03-12 DIAGNOSIS — G4700 Insomnia, unspecified: Secondary | ICD-10-CM | POA: Diagnosis not present

## 2023-03-12 DIAGNOSIS — G2581 Restless legs syndrome: Secondary | ICD-10-CM | POA: Diagnosis not present

## 2023-03-13 DIAGNOSIS — G4700 Insomnia, unspecified: Secondary | ICD-10-CM | POA: Diagnosis not present

## 2023-03-13 DIAGNOSIS — I10 Essential (primary) hypertension: Secondary | ICD-10-CM | POA: Diagnosis not present

## 2023-03-13 DIAGNOSIS — M810 Age-related osteoporosis without current pathological fracture: Secondary | ICD-10-CM | POA: Diagnosis not present

## 2023-03-13 DIAGNOSIS — K5909 Other constipation: Secondary | ICD-10-CM | POA: Diagnosis not present

## 2023-03-13 DIAGNOSIS — S3210XD Unspecified fracture of sacrum, subsequent encounter for fracture with routine healing: Secondary | ICD-10-CM | POA: Diagnosis not present

## 2023-03-13 DIAGNOSIS — G2581 Restless legs syndrome: Secondary | ICD-10-CM | POA: Diagnosis not present

## 2023-03-19 DIAGNOSIS — K581 Irritable bowel syndrome with constipation: Secondary | ICD-10-CM | POA: Diagnosis not present

## 2023-03-19 DIAGNOSIS — I10 Essential (primary) hypertension: Secondary | ICD-10-CM | POA: Diagnosis not present

## 2023-03-19 DIAGNOSIS — G4700 Insomnia, unspecified: Secondary | ICD-10-CM | POA: Diagnosis not present

## 2023-03-19 DIAGNOSIS — S3210XD Unspecified fracture of sacrum, subsequent encounter for fracture with routine healing: Secondary | ICD-10-CM | POA: Diagnosis not present

## 2023-03-19 DIAGNOSIS — R7303 Prediabetes: Secondary | ICD-10-CM | POA: Diagnosis not present

## 2023-03-19 DIAGNOSIS — M810 Age-related osteoporosis without current pathological fracture: Secondary | ICD-10-CM | POA: Diagnosis not present

## 2023-03-19 DIAGNOSIS — R2681 Unsteadiness on feet: Secondary | ICD-10-CM | POA: Diagnosis not present

## 2023-03-19 DIAGNOSIS — Z8781 Personal history of (healed) traumatic fracture: Secondary | ICD-10-CM | POA: Diagnosis not present

## 2023-03-19 DIAGNOSIS — G2581 Restless legs syndrome: Secondary | ICD-10-CM | POA: Diagnosis not present

## 2023-03-19 DIAGNOSIS — K5909 Other constipation: Secondary | ICD-10-CM | POA: Diagnosis not present

## 2023-03-19 DIAGNOSIS — Z9181 History of falling: Secondary | ICD-10-CM | POA: Diagnosis not present

## 2023-03-20 ENCOUNTER — Other Ambulatory Visit: Payer: Self-pay | Admitting: Family Medicine

## 2023-03-20 DIAGNOSIS — G8929 Other chronic pain: Secondary | ICD-10-CM

## 2023-03-26 DIAGNOSIS — K5909 Other constipation: Secondary | ICD-10-CM | POA: Diagnosis not present

## 2023-03-26 DIAGNOSIS — G4700 Insomnia, unspecified: Secondary | ICD-10-CM | POA: Diagnosis not present

## 2023-03-26 DIAGNOSIS — G2581 Restless legs syndrome: Secondary | ICD-10-CM | POA: Diagnosis not present

## 2023-03-26 DIAGNOSIS — I10 Essential (primary) hypertension: Secondary | ICD-10-CM | POA: Diagnosis not present

## 2023-03-26 DIAGNOSIS — S3210XD Unspecified fracture of sacrum, subsequent encounter for fracture with routine healing: Secondary | ICD-10-CM | POA: Diagnosis not present

## 2023-03-26 DIAGNOSIS — M810 Age-related osteoporosis without current pathological fracture: Secondary | ICD-10-CM | POA: Diagnosis not present

## 2023-04-05 ENCOUNTER — Telehealth: Payer: Self-pay

## 2023-04-05 DIAGNOSIS — M810 Age-related osteoporosis without current pathological fracture: Secondary | ICD-10-CM | POA: Diagnosis not present

## 2023-04-05 DIAGNOSIS — K5909 Other constipation: Secondary | ICD-10-CM | POA: Diagnosis not present

## 2023-04-05 DIAGNOSIS — G4700 Insomnia, unspecified: Secondary | ICD-10-CM | POA: Diagnosis not present

## 2023-04-05 DIAGNOSIS — G2581 Restless legs syndrome: Secondary | ICD-10-CM | POA: Diagnosis not present

## 2023-04-05 DIAGNOSIS — S3210XD Unspecified fracture of sacrum, subsequent encounter for fracture with routine healing: Secondary | ICD-10-CM | POA: Diagnosis not present

## 2023-04-05 DIAGNOSIS — I10 Essential (primary) hypertension: Secondary | ICD-10-CM | POA: Diagnosis not present

## 2023-04-05 NOTE — Telephone Encounter (Signed)
Ok for verbal orders.    Makiera Simmons-Robinson, MD  [facility]  

## 2023-04-05 NOTE — Telephone Encounter (Signed)
Copied from CRM [id number]. Topic: General - Other >> Apr 05, 2023  3:32 PM Epimenio Foot F wrote: Reason for CRM: Thayer Ohm with [facility] is calling in requesting continuing Physical Therapy for 1 week 9 . Thayer Ohm can be reached at [phone]

## 2023-04-08 NOTE — Telephone Encounter (Signed)
Verbals orders given to [facility]

## 2023-04-10 DIAGNOSIS — I10 Essential (primary) hypertension: Secondary | ICD-10-CM | POA: Diagnosis not present

## 2023-04-10 DIAGNOSIS — K5909 Other constipation: Secondary | ICD-10-CM | POA: Diagnosis not present

## 2023-04-10 DIAGNOSIS — G4700 Insomnia, unspecified: Secondary | ICD-10-CM | POA: Diagnosis not present

## 2023-04-10 DIAGNOSIS — Z79891 Long term (current) use of opiate analgesic: Secondary | ICD-10-CM | POA: Diagnosis not present

## 2023-04-10 DIAGNOSIS — H3530 Unspecified macular degeneration: Secondary | ICD-10-CM | POA: Diagnosis not present

## 2023-04-10 DIAGNOSIS — S3210XD Unspecified fracture of sacrum, subsequent encounter for fracture with routine healing: Secondary | ICD-10-CM | POA: Diagnosis not present

## 2023-04-10 DIAGNOSIS — Z9181 History of falling: Secondary | ICD-10-CM | POA: Diagnosis not present

## 2023-04-10 DIAGNOSIS — Z7982 Long term (current) use of aspirin: Secondary | ICD-10-CM | POA: Diagnosis not present

## 2023-04-10 DIAGNOSIS — G2581 Restless legs syndrome: Secondary | ICD-10-CM | POA: Diagnosis not present

## 2023-04-10 DIAGNOSIS — M810 Age-related osteoporosis without current pathological fracture: Secondary | ICD-10-CM | POA: Diagnosis not present

## 2023-04-12 DIAGNOSIS — G2581 Restless legs syndrome: Secondary | ICD-10-CM | POA: Diagnosis not present

## 2023-04-12 DIAGNOSIS — K5909 Other constipation: Secondary | ICD-10-CM | POA: Diagnosis not present

## 2023-04-12 DIAGNOSIS — S3210XD Unspecified fracture of sacrum, subsequent encounter for fracture with routine healing: Secondary | ICD-10-CM | POA: Diagnosis not present

## 2023-04-12 DIAGNOSIS — I10 Essential (primary) hypertension: Secondary | ICD-10-CM | POA: Diagnosis not present

## 2023-04-12 DIAGNOSIS — M810 Age-related osteoporosis without current pathological fracture: Secondary | ICD-10-CM | POA: Diagnosis not present

## 2023-04-12 DIAGNOSIS — G4700 Insomnia, unspecified: Secondary | ICD-10-CM | POA: Diagnosis not present

## 2023-04-16 DIAGNOSIS — I10 Essential (primary) hypertension: Secondary | ICD-10-CM | POA: Diagnosis not present

## 2023-04-16 DIAGNOSIS — S3210XD Unspecified fracture of sacrum, subsequent encounter for fracture with routine healing: Secondary | ICD-10-CM | POA: Diagnosis not present

## 2023-04-16 DIAGNOSIS — K5909 Other constipation: Secondary | ICD-10-CM | POA: Diagnosis not present

## 2023-04-16 DIAGNOSIS — G4700 Insomnia, unspecified: Secondary | ICD-10-CM | POA: Diagnosis not present

## 2023-04-16 DIAGNOSIS — G2581 Restless legs syndrome: Secondary | ICD-10-CM | POA: Diagnosis not present

## 2023-04-16 DIAGNOSIS — M810 Age-related osteoporosis without current pathological fracture: Secondary | ICD-10-CM | POA: Diagnosis not present

## 2023-04-23 ENCOUNTER — Other Ambulatory Visit: Payer: Self-pay | Admitting: Family Medicine

## 2023-04-23 DIAGNOSIS — K5909 Other constipation: Secondary | ICD-10-CM | POA: Diagnosis not present

## 2023-04-23 DIAGNOSIS — M810 Age-related osteoporosis without current pathological fracture: Secondary | ICD-10-CM | POA: Diagnosis not present

## 2023-04-23 DIAGNOSIS — G4700 Insomnia, unspecified: Secondary | ICD-10-CM | POA: Diagnosis not present

## 2023-04-23 DIAGNOSIS — G2581 Restless legs syndrome: Secondary | ICD-10-CM | POA: Diagnosis not present

## 2023-04-23 DIAGNOSIS — K219 Gastro-esophageal reflux disease without esophagitis: Secondary | ICD-10-CM

## 2023-04-23 DIAGNOSIS — S3210XD Unspecified fracture of sacrum, subsequent encounter for fracture with routine healing: Secondary | ICD-10-CM | POA: Diagnosis not present

## 2023-04-23 DIAGNOSIS — I10 Essential (primary) hypertension: Secondary | ICD-10-CM | POA: Diagnosis not present

## 2023-04-30 ENCOUNTER — Other Ambulatory Visit: Payer: Self-pay | Admitting: Family Medicine

## 2023-05-01 DIAGNOSIS — G4700 Insomnia, unspecified: Secondary | ICD-10-CM | POA: Diagnosis not present

## 2023-05-01 DIAGNOSIS — M810 Age-related osteoporosis without current pathological fracture: Secondary | ICD-10-CM | POA: Diagnosis not present

## 2023-05-01 DIAGNOSIS — G2581 Restless legs syndrome: Secondary | ICD-10-CM | POA: Diagnosis not present

## 2023-05-01 DIAGNOSIS — K5909 Other constipation: Secondary | ICD-10-CM | POA: Diagnosis not present

## 2023-05-01 DIAGNOSIS — S3210XD Unspecified fracture of sacrum, subsequent encounter for fracture with routine healing: Secondary | ICD-10-CM | POA: Diagnosis not present

## 2023-05-01 DIAGNOSIS — I10 Essential (primary) hypertension: Secondary | ICD-10-CM | POA: Diagnosis not present

## 2023-05-07 DIAGNOSIS — S3210XD Unspecified fracture of sacrum, subsequent encounter for fracture with routine healing: Secondary | ICD-10-CM | POA: Diagnosis not present

## 2023-05-07 DIAGNOSIS — M810 Age-related osteoporosis without current pathological fracture: Secondary | ICD-10-CM | POA: Diagnosis not present

## 2023-05-07 DIAGNOSIS — G4700 Insomnia, unspecified: Secondary | ICD-10-CM | POA: Diagnosis not present

## 2023-05-07 DIAGNOSIS — K5909 Other constipation: Secondary | ICD-10-CM | POA: Diagnosis not present

## 2023-05-07 DIAGNOSIS — I10 Essential (primary) hypertension: Secondary | ICD-10-CM | POA: Diagnosis not present

## 2023-05-07 DIAGNOSIS — G2581 Restless legs syndrome: Secondary | ICD-10-CM | POA: Diagnosis not present

## 2023-05-10 DIAGNOSIS — G4700 Insomnia, unspecified: Secondary | ICD-10-CM | POA: Diagnosis not present

## 2023-05-10 DIAGNOSIS — Z9181 History of falling: Secondary | ICD-10-CM | POA: Diagnosis not present

## 2023-05-10 DIAGNOSIS — Z79891 Long term (current) use of opiate analgesic: Secondary | ICD-10-CM | POA: Diagnosis not present

## 2023-05-10 DIAGNOSIS — H3530 Unspecified macular degeneration: Secondary | ICD-10-CM | POA: Diagnosis not present

## 2023-05-10 DIAGNOSIS — I10 Essential (primary) hypertension: Secondary | ICD-10-CM | POA: Diagnosis not present

## 2023-05-10 DIAGNOSIS — K5909 Other constipation: Secondary | ICD-10-CM | POA: Diagnosis not present

## 2023-05-10 DIAGNOSIS — S3210XD Unspecified fracture of sacrum, subsequent encounter for fracture with routine healing: Secondary | ICD-10-CM | POA: Diagnosis not present

## 2023-05-10 DIAGNOSIS — Z7982 Long term (current) use of aspirin: Secondary | ICD-10-CM | POA: Diagnosis not present

## 2023-05-10 DIAGNOSIS — M810 Age-related osteoporosis without current pathological fracture: Secondary | ICD-10-CM | POA: Diagnosis not present

## 2023-05-10 DIAGNOSIS — G2581 Restless legs syndrome: Secondary | ICD-10-CM | POA: Diagnosis not present

## 2023-05-13 DIAGNOSIS — E113211 Type 2 diabetes mellitus with mild nonproliferative diabetic retinopathy with macular edema, right eye: Secondary | ICD-10-CM | POA: Diagnosis not present

## 2023-05-13 DIAGNOSIS — H353221 Exudative age-related macular degeneration, left eye, with active choroidal neovascularization: Secondary | ICD-10-CM | POA: Diagnosis not present

## 2023-05-14 DIAGNOSIS — G2581 Restless legs syndrome: Secondary | ICD-10-CM | POA: Diagnosis not present

## 2023-05-14 DIAGNOSIS — I10 Essential (primary) hypertension: Secondary | ICD-10-CM | POA: Diagnosis not present

## 2023-05-14 DIAGNOSIS — S3210XD Unspecified fracture of sacrum, subsequent encounter for fracture with routine healing: Secondary | ICD-10-CM | POA: Diagnosis not present

## 2023-05-14 DIAGNOSIS — K5909 Other constipation: Secondary | ICD-10-CM | POA: Diagnosis not present

## 2023-05-14 DIAGNOSIS — G4700 Insomnia, unspecified: Secondary | ICD-10-CM | POA: Diagnosis not present

## 2023-05-14 DIAGNOSIS — M810 Age-related osteoporosis without current pathological fracture: Secondary | ICD-10-CM | POA: Diagnosis not present

## 2023-05-17 ENCOUNTER — Other Ambulatory Visit: Payer: Self-pay | Admitting: Family Medicine

## 2023-05-17 DIAGNOSIS — Z7982 Long term (current) use of aspirin: Secondary | ICD-10-CM | POA: Diagnosis not present

## 2023-05-17 DIAGNOSIS — G4709 Other insomnia: Secondary | ICD-10-CM | POA: Diagnosis not present

## 2023-05-17 DIAGNOSIS — K5909 Other constipation: Secondary | ICD-10-CM | POA: Diagnosis not present

## 2023-05-17 DIAGNOSIS — M810 Age-related osteoporosis without current pathological fracture: Secondary | ICD-10-CM | POA: Diagnosis not present

## 2023-05-17 DIAGNOSIS — Z9181 History of falling: Secondary | ICD-10-CM | POA: Diagnosis not present

## 2023-05-17 DIAGNOSIS — Z79891 Long term (current) use of opiate analgesic: Secondary | ICD-10-CM | POA: Diagnosis not present

## 2023-05-17 DIAGNOSIS — S3210XD Unspecified fracture of sacrum, subsequent encounter for fracture with routine healing: Secondary | ICD-10-CM | POA: Diagnosis not present

## 2023-05-17 DIAGNOSIS — H3530 Unspecified macular degeneration: Secondary | ICD-10-CM | POA: Diagnosis not present

## 2023-05-17 DIAGNOSIS — G2581 Restless legs syndrome: Secondary | ICD-10-CM | POA: Diagnosis not present

## 2023-05-17 DIAGNOSIS — I10 Essential (primary) hypertension: Secondary | ICD-10-CM | POA: Diagnosis not present

## 2023-05-20 NOTE — Telephone Encounter (Signed)
Requested Prescriptions  Refused Prescriptions Disp Refills   rOPINIRole (REQUIP) 0.25 MG tablet [Pharmacy Med Name: rOPINIRole HCl 0.25 MG Oral Tablet] 90 tablet 3    Sig: TAKE 1 TABLET BY MOUTH AT  BEDTIME     Neurology:  Parkinsonian Agents Passed - 05/17/2023 10:39 PM      Passed - Last BP in normal range    BP Readings from Last 1 Encounters:  11/28/22 128/79         Passed - Last Heart Rate in normal range    Pulse Readings from Last 1 Encounters:  11/28/22 65         Passed - Valid encounter within last 12 months    Recent Outpatient Visits           5 months ago Benign essential HTN   [place] [facility] [place], [place], MD   8 months ago Constipation due to opioid therapy   [place] [facility] Simmons-Robinson, [employer], MD   9 months ago Constipation due to opioid therapy   [place] [facility] Simmons-Robinson, [place], MD   10 months ago Chronic pain of right hip   [place] [facility] Simmons-Robinson, [place], MD   1 year ago Diabetes mellitus type 2 without retinopathy [facility])   [place] [facility] Bosie Clos, MD

## 2023-05-21 DIAGNOSIS — K5909 Other constipation: Secondary | ICD-10-CM | POA: Diagnosis not present

## 2023-05-21 DIAGNOSIS — G4700 Insomnia, unspecified: Secondary | ICD-10-CM | POA: Diagnosis not present

## 2023-05-21 DIAGNOSIS — S3210XD Unspecified fracture of sacrum, subsequent encounter for fracture with routine healing: Secondary | ICD-10-CM | POA: Diagnosis not present

## 2023-05-21 DIAGNOSIS — G2581 Restless legs syndrome: Secondary | ICD-10-CM | POA: Diagnosis not present

## 2023-05-21 DIAGNOSIS — I10 Essential (primary) hypertension: Secondary | ICD-10-CM | POA: Diagnosis not present

## 2023-05-21 DIAGNOSIS — M810 Age-related osteoporosis without current pathological fracture: Secondary | ICD-10-CM | POA: Diagnosis not present

## 2023-05-22 ENCOUNTER — Other Ambulatory Visit: Payer: Self-pay | Admitting: Family Medicine

## 2023-05-29 DIAGNOSIS — K5909 Other constipation: Secondary | ICD-10-CM | POA: Diagnosis not present

## 2023-05-29 DIAGNOSIS — I10 Essential (primary) hypertension: Secondary | ICD-10-CM | POA: Diagnosis not present

## 2023-05-29 DIAGNOSIS — G2581 Restless legs syndrome: Secondary | ICD-10-CM | POA: Diagnosis not present

## 2023-05-29 DIAGNOSIS — G4700 Insomnia, unspecified: Secondary | ICD-10-CM | POA: Diagnosis not present

## 2023-05-29 DIAGNOSIS — S3210XD Unspecified fracture of sacrum, subsequent encounter for fracture with routine healing: Secondary | ICD-10-CM | POA: Diagnosis not present

## 2023-05-29 DIAGNOSIS — M810 Age-related osteoporosis without current pathological fracture: Secondary | ICD-10-CM | POA: Diagnosis not present

## 2023-06-04 DIAGNOSIS — I10 Essential (primary) hypertension: Secondary | ICD-10-CM | POA: Diagnosis not present

## 2023-06-04 DIAGNOSIS — K5909 Other constipation: Secondary | ICD-10-CM | POA: Diagnosis not present

## 2023-06-04 DIAGNOSIS — S3210XD Unspecified fracture of sacrum, subsequent encounter for fracture with routine healing: Secondary | ICD-10-CM | POA: Diagnosis not present

## 2023-06-04 DIAGNOSIS — M810 Age-related osteoporosis without current pathological fracture: Secondary | ICD-10-CM | POA: Diagnosis not present

## 2023-06-04 DIAGNOSIS — G4700 Insomnia, unspecified: Secondary | ICD-10-CM | POA: Diagnosis not present

## 2023-06-04 DIAGNOSIS — G2581 Restless legs syndrome: Secondary | ICD-10-CM | POA: Diagnosis not present

## 2023-06-17 ENCOUNTER — Other Ambulatory Visit: Payer: Self-pay | Admitting: Family Medicine

## 2023-06-18 NOTE — Telephone Encounter (Signed)
Requested Prescriptions  Pending Prescriptions Disp Refills   [facility] SENNA 8.6 MG tablet [Pharmacy Med Name: [facility] SENNA LAXATIVE 8.6 MG TAB] 180 tablet 1    Sig: TAKE 1 TABLET BY MOUTH 2 TIMES DAILY.     Over the Counter:  OTC Passed - 06/17/2023  1:09 PM      Passed - Valid encounter within last 12 months    Recent Outpatient Visits           6 months ago Benign essential HTN   [place] [facility] [place], [place], MD   9 months ago Constipation due to opioid therapy   [place] [facility] Simmons-Robinson, [place], MD   10 months ago Constipation due to opioid therapy   [place] [facility] Simmons-Robinson, [place], MD   11 months ago Chronic pain of right hip   [place] [facility] Simmons-Robinson, [place], MD   1 year ago Diabetes mellitus type 2 without retinopathy [facility])    [place] [facility] Bosie Clos, MD

## 2023-06-19 DIAGNOSIS — R2681 Unsteadiness on feet: Secondary | ICD-10-CM | POA: Diagnosis not present

## 2023-06-19 DIAGNOSIS — E119 Type 2 diabetes mellitus without complications: Secondary | ICD-10-CM | POA: Diagnosis not present

## 2023-06-19 DIAGNOSIS — Z23 Encounter for immunization: Secondary | ICD-10-CM | POA: Diagnosis not present

## 2023-06-19 DIAGNOSIS — E7800 Pure hypercholesterolemia, unspecified: Secondary | ICD-10-CM | POA: Diagnosis not present

## 2023-06-19 DIAGNOSIS — Z0000 Encounter for general adult medical examination without abnormal findings: Secondary | ICD-10-CM | POA: Diagnosis not present

## 2023-06-19 DIAGNOSIS — I10 Essential (primary) hypertension: Secondary | ICD-10-CM | POA: Diagnosis not present

## 2023-06-19 DIAGNOSIS — I4892 Unspecified atrial flutter: Secondary | ICD-10-CM | POA: Diagnosis not present

## 2023-06-19 DIAGNOSIS — I4891 Unspecified atrial fibrillation: Secondary | ICD-10-CM | POA: Diagnosis not present

## 2023-06-19 DIAGNOSIS — Z8781 Personal history of (healed) traumatic fracture: Secondary | ICD-10-CM | POA: Diagnosis not present

## 2023-06-19 NOTE — Telephone Encounter (Signed)
Requested medication (s) are due for refill today: Yes  Requested medication (s) are on the active medication list: Yes  Last refill:  11/28/22  Future visit scheduled: No  Notes to clinic:  Pt. Seen at different practice.    Requested Prescriptions  Pending Prescriptions Disp Refills   isosorbide mononitrate (IMDUR) 30 MG 24 hr tablet [Pharmacy Med Name: Isosorbide Mononitrate ER 30 MG Oral Tablet Extended Release 24 Hour] 90 tablet 3    Sig: TAKE 1 TABLET BY MOUTH DAILY     Cardiovascular:  Nitrates Passed - 06/17/2023 10:15 PM      Passed - Last BP in normal range    BP Readings from Last 1 Encounters:  11/28/22 128/79         Passed - Last Heart Rate in normal range    Pulse Readings from Last 1 Encounters:  11/28/22 65         Passed - Valid encounter within last 12 months    Recent Outpatient Visits           6 months ago Benign essential HTN   [place] [facility] Simmons-Robinson, [place], MD   9 months ago Constipation due to opioid therapy   [place] [facility] Simmons-Robinson, [place], MD   10 months ago Constipation due to opioid therapy   [place] [facility] [place], [place], MD   11 months ago Chronic pain of right hip   [place] [facility] Simmons-Robinson, [place], MD   1 year ago Diabetes mellitus type 2 without retinopathy [facility])   [place] [facility] Bosie Clos, MD

## 2023-06-24 ENCOUNTER — Other Ambulatory Visit: Payer: Self-pay | Admitting: Family Medicine

## 2023-06-24 DIAGNOSIS — G4709 Other insomnia: Secondary | ICD-10-CM

## 2023-06-25 ENCOUNTER — Other Ambulatory Visit: Payer: Self-pay | Admitting: Family Medicine

## 2023-06-25 DIAGNOSIS — G4709 Other insomnia: Secondary | ICD-10-CM

## 2023-06-25 NOTE — Telephone Encounter (Signed)
Requested medication (s) are due for refill today: Yes  Requested medication (s) are on the active medication list: Yes  Last refill:    Future visit scheduled: No  Notes to clinic:  Pt. No longer seen at the practice.    Requested Prescriptions  Pending Prescriptions Disp Refills   traZODone (DESYREL) 50 MG tablet [Pharmacy Med Name: TRAZODONE 50 MG TABLET] 135 tablet 2    Sig: TAKE 1.5 TABS AT BEDTIME AS NEEDED FOR SLEEP     Psychiatry: Antidepressants - Serotonin Modulator Failed - 06/24/2023  9:59 AM      Failed - Valid encounter within last 6 months    Recent Outpatient Visits           6 months ago Benign essential HTN   [place] [facility] Simmons-Robinson, [place], MD   10 months ago Constipation due to opioid therapy   [place] [facility] Simmons-Robinson, [place], MD   10 months ago Constipation due to opioid therapy   [place] [facility] Simmons-Robinson, [place], MD   11 months ago Chronic pain of right hip   [place] [facility] Simmons-Robinson, [place], MD   1 year ago Diabetes mellitus type 2 without retinopathy (HCC)   [place] [facility] Bosie Clos, MD               metoprolol tartrate (LOPRESSOR) 25 MG tablet [Pharmacy Med Name: METOPROLOL TARTRATE 25 MG TAB] 270 tablet 1    Sig: Take 1 tablet (25 mg total) by mouth in the morning, at noon, and at bedtime.     Cardiovascular:  Beta Blockers Failed - 06/24/2023  9:59 AM      Failed - Valid encounter within last 6 months    Recent Outpatient Visits           6 months ago Benign essential HTN   [place] [facility] Simmons-Robinson, [place], MD   10 months ago Constipation due to opioid therapy   [place] [facility] Simmons-Robinson, [place], MD   10 months ago Constipation due to opioid therapy   [place] [facility] [place],  [place], MD   11 months ago Chronic pain of right hip   [place] [facility] Simmons-Robinson, [place], MD   1 year ago Diabetes mellitus type 2 without retinopathy [facility])   [place] [facility] Bosie Clos, MD              Passed - Last BP in normal range    BP Readings from Last 1 Encounters:  11/28/22 128/79         Passed - Last Heart Rate in normal range    Pulse Readings from Last 1 Encounters:  11/28/22 65

## 2023-06-26 NOTE — Telephone Encounter (Signed)
Refused Trazodone 50 mg because she is a pt. Of Dr. Julieanne Manson who is no longer with [facility].   She is seeing Dr. Sullivan Lone at [facility].  Requested Prescriptions  Pending Prescriptions Disp Refills   traZODone (DESYREL) 50 MG tablet [Pharmacy Med Name: TRAZODONE 50 MG TABLET] 135 tablet 2    Sig: TAKE 1.5 TABS AT BEDTIME AS NEEDED FOR SLEEP     Psychiatry: Antidepressants - Serotonin Modulator Failed - 06/25/2023  1:24 PM      Failed - Valid encounter within last 6 months    Recent Outpatient Visits           7 months ago Benign essential HTN   [place] [facility] Simmons-Robinson, [place], MD   10 months ago Constipation due to opioid therapy   [place] [facility] Simmons-Robinson, [place], MD   10 months ago Constipation due to opioid therapy   [place] [facility] Simmons-Robinson, [place], MD   11 months ago Chronic pain of right hip   [place] [facility] Simmons-Robinson, [place], MD   1 year ago Diabetes mellitus type 2 without retinopathy [facility])   [place] [facility] Bosie Clos, MD

## 2023-07-03 ENCOUNTER — Other Ambulatory Visit: Payer: Self-pay | Admitting: Family Medicine

## 2023-07-22 DIAGNOSIS — H353221 Exudative age-related macular degeneration, left eye, with active choroidal neovascularization: Secondary | ICD-10-CM | POA: Diagnosis not present

## 2023-07-22 DIAGNOSIS — H353211 Exudative age-related macular degeneration, right eye, with active choroidal neovascularization: Secondary | ICD-10-CM | POA: Diagnosis not present

## 2023-08-14 ENCOUNTER — Other Ambulatory Visit: Payer: Self-pay | Admitting: Family Medicine

## 2023-09-10 ENCOUNTER — Other Ambulatory Visit: Payer: Self-pay | Admitting: Family Medicine

## 2023-09-10 DIAGNOSIS — G4709 Other insomnia: Secondary | ICD-10-CM

## 2023-09-10 DIAGNOSIS — I10 Essential (primary) hypertension: Secondary | ICD-10-CM

## 2023-09-30 DIAGNOSIS — H353211 Exudative age-related macular degeneration, right eye, with active choroidal neovascularization: Secondary | ICD-10-CM | POA: Diagnosis not present

## 2023-09-30 DIAGNOSIS — H353221 Exudative age-related macular degeneration, left eye, with active choroidal neovascularization: Secondary | ICD-10-CM | POA: Diagnosis not present

## 2023-10-29 DIAGNOSIS — G8929 Other chronic pain: Secondary | ICD-10-CM | POA: Diagnosis not present

## 2023-10-29 DIAGNOSIS — M25571 Pain in right ankle and joints of right foot: Secondary | ICD-10-CM | POA: Diagnosis not present

## 2023-10-29 DIAGNOSIS — R519 Headache, unspecified: Secondary | ICD-10-CM | POA: Diagnosis not present

## 2023-10-29 DIAGNOSIS — G44321 Chronic post-traumatic headache, intractable: Secondary | ICD-10-CM | POA: Diagnosis not present

## 2023-10-29 DIAGNOSIS — M5416 Radiculopathy, lumbar region: Secondary | ICD-10-CM | POA: Diagnosis not present

## 2023-10-29 DIAGNOSIS — K581 Irritable bowel syndrome with constipation: Secondary | ICD-10-CM | POA: Diagnosis not present

## 2023-10-29 DIAGNOSIS — E119 Type 2 diabetes mellitus without complications: Secondary | ICD-10-CM | POA: Diagnosis not present

## 2023-11-08 ENCOUNTER — Other Ambulatory Visit: Payer: Self-pay | Admitting: Family Medicine

## 2023-11-08 DIAGNOSIS — G4709 Other insomnia: Secondary | ICD-10-CM

## 2023-11-08 DIAGNOSIS — I10 Essential (primary) hypertension: Secondary | ICD-10-CM

## 2023-12-09 DIAGNOSIS — H353211 Exudative age-related macular degeneration, right eye, with active choroidal neovascularization: Secondary | ICD-10-CM | POA: Diagnosis not present

## 2023-12-09 DIAGNOSIS — H353221 Exudative age-related macular degeneration, left eye, with active choroidal neovascularization: Secondary | ICD-10-CM | POA: Diagnosis not present

## 2023-12-11 DIAGNOSIS — I471 Supraventricular tachycardia, unspecified: Secondary | ICD-10-CM | POA: Diagnosis not present

## 2023-12-11 DIAGNOSIS — I10 Essential (primary) hypertension: Secondary | ICD-10-CM | POA: Diagnosis not present

## 2023-12-11 DIAGNOSIS — I2089 Other forms of angina pectoris: Secondary | ICD-10-CM | POA: Diagnosis not present

## 2023-12-11 DIAGNOSIS — R300 Dysuria: Secondary | ICD-10-CM | POA: Diagnosis not present

## 2023-12-11 DIAGNOSIS — I4891 Unspecified atrial fibrillation: Secondary | ICD-10-CM | POA: Diagnosis not present

## 2023-12-11 DIAGNOSIS — R309 Painful micturition, unspecified: Secondary | ICD-10-CM | POA: Diagnosis not present

## 2023-12-11 DIAGNOSIS — I4892 Unspecified atrial flutter: Secondary | ICD-10-CM | POA: Diagnosis not present

## 2023-12-16 ENCOUNTER — Other Ambulatory Visit: Payer: Self-pay | Admitting: Family Medicine

## 2023-12-16 DIAGNOSIS — I10 Essential (primary) hypertension: Secondary | ICD-10-CM

## 2024-01-01 DIAGNOSIS — M79601 Pain in right arm: Secondary | ICD-10-CM | POA: Diagnosis not present

## 2024-01-01 DIAGNOSIS — G8929 Other chronic pain: Secondary | ICD-10-CM | POA: Diagnosis not present

## 2024-01-01 DIAGNOSIS — M25511 Pain in right shoulder: Secondary | ICD-10-CM | POA: Diagnosis not present

## 2024-01-01 DIAGNOSIS — Z9181 History of falling: Secondary | ICD-10-CM | POA: Diagnosis not present

## 2024-01-01 DIAGNOSIS — M25571 Pain in right ankle and joints of right foot: Secondary | ICD-10-CM | POA: Diagnosis not present

## 2024-01-01 DIAGNOSIS — Z8659 Personal history of other mental and behavioral disorders: Secondary | ICD-10-CM | POA: Diagnosis not present

## 2024-02-03 ENCOUNTER — Other Ambulatory Visit: Payer: Self-pay | Admitting: Family Medicine

## 2024-02-03 DIAGNOSIS — I10 Essential (primary) hypertension: Secondary | ICD-10-CM

## 2024-03-10 DIAGNOSIS — H353221 Exudative age-related macular degeneration, left eye, with active choroidal neovascularization: Secondary | ICD-10-CM | POA: Diagnosis not present

## 2024-03-10 DIAGNOSIS — H353211 Exudative age-related macular degeneration, right eye, with active choroidal neovascularization: Secondary | ICD-10-CM | POA: Diagnosis not present

## 2024-04-07 DIAGNOSIS — F419 Anxiety disorder, unspecified: Secondary | ICD-10-CM | POA: Diagnosis not present

## 2024-04-07 DIAGNOSIS — Z9181 History of falling: Secondary | ICD-10-CM | POA: Diagnosis not present

## 2024-04-07 DIAGNOSIS — Z1331 Encounter for screening for depression: Secondary | ICD-10-CM | POA: Diagnosis not present

## 2024-04-07 DIAGNOSIS — M5416 Radiculopathy, lumbar region: Secondary | ICD-10-CM | POA: Diagnosis not present

## 2024-04-07 DIAGNOSIS — E119 Type 2 diabetes mellitus without complications: Secondary | ICD-10-CM | POA: Diagnosis not present

## 2024-04-07 DIAGNOSIS — Z7982 Long term (current) use of aspirin: Secondary | ICD-10-CM | POA: Diagnosis not present

## 2024-04-07 DIAGNOSIS — M79601 Pain in right arm: Secondary | ICD-10-CM | POA: Diagnosis not present

## 2024-04-07 DIAGNOSIS — F32A Depression, unspecified: Secondary | ICD-10-CM | POA: Diagnosis not present

## 2024-04-07 DIAGNOSIS — I10 Essential (primary) hypertension: Secondary | ICD-10-CM | POA: Diagnosis not present

## 2024-04-07 DIAGNOSIS — E7800 Pure hypercholesterolemia, unspecified: Secondary | ICD-10-CM | POA: Diagnosis not present

## 2024-04-07 DIAGNOSIS — I4891 Unspecified atrial fibrillation: Secondary | ICD-10-CM | POA: Diagnosis not present

## 2024-04-07 DIAGNOSIS — I4892 Unspecified atrial flutter: Secondary | ICD-10-CM | POA: Diagnosis not present

## 2024-05-19 ENCOUNTER — Other Ambulatory Visit: Payer: Self-pay | Admitting: Family Medicine

## 2024-05-26 DIAGNOSIS — H353221 Exudative age-related macular degeneration, left eye, with active choroidal neovascularization: Secondary | ICD-10-CM | POA: Diagnosis not present

## 2024-05-26 DIAGNOSIS — H353211 Exudative age-related macular degeneration, right eye, with active choroidal neovascularization: Secondary | ICD-10-CM | POA: Diagnosis not present

## 2024-06-04 ENCOUNTER — Other Ambulatory Visit: Payer: Self-pay | Admitting: Family Medicine

## 2024-07-15 DIAGNOSIS — Z0000 Encounter for general adult medical examination without abnormal findings: Secondary | ICD-10-CM | POA: Diagnosis not present

## 2024-07-15 DIAGNOSIS — E7800 Pure hypercholesterolemia, unspecified: Secondary | ICD-10-CM | POA: Diagnosis not present

## 2024-07-15 DIAGNOSIS — Z1331 Encounter for screening for depression: Secondary | ICD-10-CM | POA: Diagnosis not present

## 2024-07-15 DIAGNOSIS — E2839 Other primary ovarian failure: Secondary | ICD-10-CM | POA: Diagnosis not present

## 2024-07-15 DIAGNOSIS — K581 Irritable bowel syndrome with constipation: Secondary | ICD-10-CM | POA: Diagnosis not present

## 2024-07-15 DIAGNOSIS — H9193 Unspecified hearing loss, bilateral: Secondary | ICD-10-CM | POA: Diagnosis not present

## 2024-07-15 DIAGNOSIS — F419 Anxiety disorder, unspecified: Secondary | ICD-10-CM | POA: Diagnosis not present

## 2024-07-15 DIAGNOSIS — E119 Type 2 diabetes mellitus without complications: Secondary | ICD-10-CM | POA: Diagnosis not present

## 2024-08-03 DIAGNOSIS — H353211 Exudative age-related macular degeneration, right eye, with active choroidal neovascularization: Secondary | ICD-10-CM | POA: Diagnosis not present

## 2024-08-03 DIAGNOSIS — H353221 Exudative age-related macular degeneration, left eye, with active choroidal neovascularization: Secondary | ICD-10-CM | POA: Diagnosis not present
